# Patient Record
Sex: Female | Born: 1965 | Race: White | Hispanic: No | Marital: Married | Smoking: Current every day smoker
Health system: Southern US, Community
[De-identification: ages and names within clinical notes are randomized; demographics above are authoritative.]

## PROBLEM LIST (undated history)

## (undated) DIAGNOSIS — J45909 Unspecified asthma, uncomplicated: Secondary | ICD-10-CM

## (undated) DIAGNOSIS — K219 Gastro-esophageal reflux disease without esophagitis: Secondary | ICD-10-CM

## (undated) DIAGNOSIS — F17200 Nicotine dependence, unspecified, uncomplicated: Secondary | ICD-10-CM

## (undated) DIAGNOSIS — G459 Transient cerebral ischemic attack, unspecified: Secondary | ICD-10-CM

## (undated) DIAGNOSIS — I639 Cerebral infarction, unspecified: Secondary | ICD-10-CM

## (undated) DIAGNOSIS — T7840XA Allergy, unspecified, initial encounter: Secondary | ICD-10-CM

## (undated) DIAGNOSIS — E785 Hyperlipidemia, unspecified: Secondary | ICD-10-CM

## (undated) DIAGNOSIS — M1990 Unspecified osteoarthritis, unspecified site: Secondary | ICD-10-CM

## (undated) DIAGNOSIS — F419 Anxiety disorder, unspecified: Secondary | ICD-10-CM

## (undated) DIAGNOSIS — IMO0001 Reserved for inherently not codable concepts without codable children: Secondary | ICD-10-CM

## (undated) HISTORY — DX: Nicotine dependence, unspecified, uncomplicated: F17.200

## (undated) HISTORY — DX: Unspecified osteoarthritis, unspecified site: M19.90

## (undated) HISTORY — DX: Gastro-esophageal reflux disease without esophagitis: K21.9

## (undated) HISTORY — DX: Hyperlipidemia, unspecified: E78.5

## (undated) HISTORY — DX: Anxiety disorder, unspecified: F41.9

## (undated) HISTORY — PX: PLANTAR FASCIA SURGERY: SHX746

## (undated) HISTORY — DX: Allergy, unspecified, initial encounter: T78.40XA

## (undated) HISTORY — DX: Unspecified asthma, uncomplicated: J45.909

## (undated) HISTORY — DX: Reserved for inherently not codable concepts without codable children: IMO0001

## (undated) HISTORY — PX: OTHER SURGICAL HISTORY: SHX169

## (undated) HISTORY — DX: Cerebral infarction, unspecified: I63.9

## (undated) HISTORY — DX: Transient cerebral ischemic attack, unspecified: G45.9

## (undated) HISTORY — PX: FOOT SURGERY: SHX648

---

## 2005-12-28 DIAGNOSIS — M19049 Primary osteoarthritis, unspecified hand: Secondary | ICD-10-CM | POA: Insufficient documentation

## 2007-05-23 DIAGNOSIS — M545 Low back pain, unspecified: Secondary | ICD-10-CM | POA: Insufficient documentation

## 2007-05-23 DIAGNOSIS — G43909 Migraine, unspecified, not intractable, without status migrainosus: Secondary | ICD-10-CM | POA: Insufficient documentation

## 2007-05-23 DIAGNOSIS — G8929 Other chronic pain: Secondary | ICD-10-CM | POA: Insufficient documentation

## 2010-05-12 DIAGNOSIS — M533 Sacrococcygeal disorders, not elsewhere classified: Secondary | ICD-10-CM | POA: Insufficient documentation

## 2010-08-22 DIAGNOSIS — M5416 Radiculopathy, lumbar region: Secondary | ICD-10-CM | POA: Insufficient documentation

## 2013-10-10 DIAGNOSIS — E785 Hyperlipidemia, unspecified: Secondary | ICD-10-CM | POA: Insufficient documentation

## 2013-10-10 DIAGNOSIS — J309 Allergic rhinitis, unspecified: Secondary | ICD-10-CM | POA: Insufficient documentation

## 2014-12-22 DIAGNOSIS — Z87891 Personal history of nicotine dependence: Secondary | ICD-10-CM | POA: Insufficient documentation

## 2015-06-14 DIAGNOSIS — J4520 Mild intermittent asthma, uncomplicated: Secondary | ICD-10-CM | POA: Insufficient documentation

## 2016-06-23 ENCOUNTER — Ambulatory Visit (INDEPENDENT_AMBULATORY_CARE_PROVIDER_SITE_OTHER): Payer: 59

## 2016-06-23 ENCOUNTER — Encounter: Payer: Self-pay | Admitting: Podiatry

## 2016-06-23 ENCOUNTER — Ambulatory Visit (INDEPENDENT_AMBULATORY_CARE_PROVIDER_SITE_OTHER): Payer: 59 | Admitting: Podiatry

## 2016-06-23 VITALS — BP 141/99 | HR 87 | Temp 98.0°F | Resp 16

## 2016-06-23 DIAGNOSIS — S92242A Displaced fracture of medial cuneiform of left foot, initial encounter for closed fracture: Secondary | ICD-10-CM

## 2016-06-23 DIAGNOSIS — R600 Localized edema: Secondary | ICD-10-CM | POA: Diagnosis not present

## 2016-06-23 DIAGNOSIS — M79672 Pain in left foot: Secondary | ICD-10-CM

## 2016-06-23 DIAGNOSIS — M722 Plantar fascial fibromatosis: Secondary | ICD-10-CM

## 2016-06-27 NOTE — Progress Notes (Signed)
   HPI: Patient is a 51 year old female presenting today with a complaint of sharp pain to the dorsum of her left foot that began 3 days ago. She reports associated numbness and tingling. She states initially the foot was painful to touch but today that has improved. Walking and standing for long periods of time increases the pain. Elevating the foot, resting, icing and taking ibuprofen helps alleviate the pain. She is here for further evaluation and treatment.    Physical Exam: General: The patient is alert and oriented x3 in no acute distress.  Dermatology: Skin is warm, dry and supple bilateral lower extremities. Negative for open lesions or macerations.  Vascular: Palpable pedal pulses bilaterally. No edema or erythema noted. Capillary refill within normal limits.  Neurological: Epicritic and protective threshold grossly intact bilaterally.   Musculoskeletal Exam: Pain with palpation to the left dorsal foot with moderate edema. Tenderness to palpation at the medial calcaneal tubercale and through the insertion of the plantar fascia of the right foot. Range of motion within normal limits to all pedal and ankle joints bilateral. Muscle strength 5/5 in all groups bilateral.   Radiographic Exam:  Subacute comminuted, minimally displaced fracture of the medial cuneiform with evidence of callus formation and healing.   Assessment: 1. Fracture of medial cuneiform of left foot with callus formation and routine healing 2. Plantar fasciitis right 3. Left foot edema   Plan of Care:  1. Patient was evaluated. X-rays reviewed. 2. Injection of 0.5cc Celestone soluspan injected into the right heel at the insertion of the plantar fascia.  3. Compression anklet dispensed for the left ankle. 4. Short cam boot for the left lower extremity. Patient can be weightbearing 4 weeks. 5. Return to clinic in 4 weeks.   Edrick Kins, DPM [facility]  Dr. Edrick Kins, [place]                                        [place], [place] [address]                Office [phone]  Fax [phone]

## 2016-06-29 MED ORDER — BETAMETHASONE SOD PHOS & ACET 6 (3-3) MG/ML IJ SUSP: 3.0000 mg | Freq: Once | INTRAMUSCULAR | Status: DC

## 2016-06-29 NOTE — Patient Instructions (Signed)
[id number]

## 2016-07-21 ENCOUNTER — Ambulatory Visit (INDEPENDENT_AMBULATORY_CARE_PROVIDER_SITE_OTHER): Payer: 59 | Admitting: Podiatry

## 2016-07-21 ENCOUNTER — Ambulatory Visit (INDEPENDENT_AMBULATORY_CARE_PROVIDER_SITE_OTHER): Payer: 59

## 2016-07-21 ENCOUNTER — Encounter: Payer: Self-pay | Admitting: Podiatry

## 2016-07-21 DIAGNOSIS — M722 Plantar fascial fibromatosis: Secondary | ICD-10-CM | POA: Diagnosis not present

## 2016-07-21 DIAGNOSIS — S92242A Displaced fracture of medial cuneiform of left foot, initial encounter for closed fracture: Secondary | ICD-10-CM | POA: Diagnosis not present

## 2016-07-21 DIAGNOSIS — S92242D Displaced fracture of medial cuneiform of left foot, subsequent encounter for fracture with routine healing: Secondary | ICD-10-CM | POA: Diagnosis not present

## 2016-07-26 DIAGNOSIS — R519 Headache, unspecified: Secondary | ICD-10-CM | POA: Insufficient documentation

## 2016-07-26 DIAGNOSIS — Z8673 Personal history of transient ischemic attack (TIA), and cerebral infarction without residual deficits: Secondary | ICD-10-CM | POA: Insufficient documentation

## 2016-07-26 DIAGNOSIS — R51 Headache: Secondary | ICD-10-CM

## 2016-07-26 DIAGNOSIS — R251 Tremor, unspecified: Secondary | ICD-10-CM | POA: Insufficient documentation

## 2016-07-29 NOTE — Progress Notes (Signed)
   HPI: As well as plantar fasciitis to the right foot. Patient states that she's doing much better. 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 Patient sells a knot on the dorsum of the left foot.   Physical Exam: General: The patient is alert and oriented x3 in no acute distress.  Dermatology: Skin is warm, dry and supple bilateral lower extremities. Negative for open lesions or macerations.  Vascular: Palpable pedal pulses bilaterally. No edema or erythema noted. Capillary refill within normal limits.  Neurological: Epicritic and protective threshold grossly intact bilaterally.   Musculoskeletal Exam: Minimal pain on palpation noted to the left foot or plantar fascial pain to the right foot. Patient appears to be improved significantly. Range of motion is within normal limits.  Radiographic Exam:  Degenerative changes with joint space narrowing and arthritis noted to the first metatarsal cuneiform joint of the left foot.  Assessment: 1. Fracture of medial cuneiform of left foot resolved  2. Plantar fasciitis right   Plan of Care:  1. Patient was evaluated. X-rays reviewed. 2. recommended the patient wear good supportive shoe gear to support the structures of her foot bilaterally   3. Return to clinic when necessary  Edrick Kins, DPM [facility]  Dr. Edrick Kins, [place]                                        [place], [place] [address]                Office [phone]  Fax [phone]

## 2016-11-17 ENCOUNTER — Ambulatory Visit: Payer: 59 | Admitting: Podiatry

## 2016-11-21 ENCOUNTER — Ambulatory Visit: Payer: 59 | Admitting: Podiatry

## 2016-11-28 ENCOUNTER — Encounter: Payer: Self-pay | Admitting: Podiatry

## 2016-11-28 ENCOUNTER — Ambulatory Visit: Payer: 59 | Admitting: Podiatry

## 2016-11-28 DIAGNOSIS — M722 Plantar fascial fibromatosis: Secondary | ICD-10-CM | POA: Diagnosis not present

## 2016-11-28 DIAGNOSIS — M898X7 Other specified disorders of bone, ankle and foot: Secondary | ICD-10-CM | POA: Diagnosis not present

## 2016-11-30 NOTE — Progress Notes (Signed)
Subjective: Patient presents today for a plantar fasciitis flare up of bilateral feet that began 2-3 weeks ago. She rates the pain at 7/10. She states the injection she received at the last visit helped alleviate the pain. Sitting for long periods of time and then attempting to walk increases the pain. Resting the feet help alleviate the pain. Patient presents today for further treatment and evaluation.  No past medical history on file.   Objective: Physical Exam General: The patient is alert and oriented x3 in no acute distress.  Dermatology: Skin is warm, dry and supple bilateral lower extremities. Negative for open lesions or macerations bilateral.   Vascular: Dorsalis Pedis and Posterior Tibial pulses palpable bilateral.  Capillary fill time is immediate to all digits.  Neurological: Epicritic and protective threshold intact bilateral.   Musculoskeletal: Tenderness to palpation at the medial calcaneal tubercale and through the insertion of the plantar fascia of the bilateral feet. Symptomatic palpable spurring noted to the 1st met-cuneiform joint of the right foot. All other joints range of motion within normal limits bilateral. Strength 5/5 in all groups bilateral.    Assessment: 1. plantar fasciitis bilateral feet 2. 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   Plan of Care:  1. Patient evaluated. Xrays reviewed.   2. Injection of 0.5cc Celestone soluspan injected into the bilateral heels.  3. Today we discussed the conservative versus surgical management of the presenting pathology. The patient opts for surgical management. All possible complications and details of the procedure were explained. All patient questions were answered. No guarantees were expressed or implied. 4. Authorization for surgery was initiated today. Surgery will consist of exostectomy 1st metatarsal of the left foot and exostectomy medial cuneiform of the left foot.  5. Plantar fascial braces dispensed  bilaterally. 6. Continue taking OTC Motrin as needed.  7. Return to clinic 1 week post op.     M. , DPM [facility]  Dr.  M. , DPM    [address]                                   [place],  [address]                Office [phone]  Fax [phone]      

## 2017-01-16 ENCOUNTER — Ambulatory Visit (INDEPENDENT_AMBULATORY_CARE_PROVIDER_SITE_OTHER): Payer: Managed Care, Other (non HMO) | Admitting: Podiatry

## 2017-01-16 ENCOUNTER — Telehealth: Payer: Self-pay | Admitting: *Deleted

## 2017-01-16 ENCOUNTER — Encounter: Payer: Self-pay | Admitting: Podiatry

## 2017-01-16 DIAGNOSIS — M898X7 Other specified disorders of bone, ankle and foot: Secondary | ICD-10-CM | POA: Diagnosis not present

## 2017-01-16 MED ORDER — MELOXICAM 15 MG PO TABS: 15.0000 mg | ORAL_TABLET | Freq: Every day | ORAL | 1 refills | Status: AC

## 2017-01-16 MED ORDER — TRAMADOL HCL 50 MG PO TABS: 50.0000 mg | ORAL_TABLET | Freq: Three times a day (TID) | ORAL | 0 refills | Status: DC | PRN

## 2017-01-16 NOTE — Telephone Encounter (Signed)
"  I was given your number to call and schedule surgery with Dr. Ruby Cola.  Give me a call back.  We had spoken another time about setting something up but I wasn't able to make that.  Now, it looks like I'm going to have both feet worked on."

## 2017-01-17 NOTE — Telephone Encounter (Signed)
"  I'm just calling back to try and get my surgery scheduled."

## 2017-01-18 NOTE — Telephone Encounter (Addendum)
I attempted to return her call.  She wants to schedule surgery. I left her a message to call me back.  "We're playing phone tag.  Sorry I just missed your call.  I'd like to set up my surgery.  I want to get one foot done as soon as possible so it can heal, then I can get the other one done."  He can do it on February 7.  "That day will be fine."  (I need the surgical information from our [place] office.)  I have the information, patient also goes by [employer].  She is having an Ostectomy Complete Metatarsal Head 1st and Tarsal Exostectomy Med Cunieform left foot.

## 2017-01-18 NOTE — Progress Notes (Signed)
   Subjective: Patient presents today for follow up evaluation of plantar fasciitis of bilateral feet. She states the pain has improved significantly at this time. She reports a new complaint of pain to the dorsum of bilateral feet. She states the pain was present at the last appointment she had here but has worsened since. Patient presents today for further treatment and evaluation.  No past medical history on file.   Objective: Physical Exam General: The patient is alert and oriented x3 in no acute distress.  Dermatology: Skin is warm, dry and supple bilateral lower extremities. Negative for open lesions or macerations bilateral.   Vascular: Dorsalis Pedis and Posterior Tibial pulses palpable bilateral.  Capillary fill time is immediate to all digits.  Neurological: Epicritic and protective threshold intact bilateral.   Musculoskeletal: Negative for tenderness to palpation at the medial calcaneal tubercale and through the insertion of the plantar fascia of the bilateral feet. Symptomatic palpable spurring noted to the 1st met-cuneiform joint of the bilateral feet. All other joints range of motion within normal limits bilateral. Strength 5/5 in all groups bilateral.    Assessment: 1. plantar fasciitis bilateral feet - resolved  2. Exostosis 1st met-cuneiform joint bilateral  Plan of Care:  1. Patient evaluated.   2. Today we will reschedule surgery for exostectomy of the 1st met-cuneiform left foot. All possible complications and details re-explained. 3. Prescription for Tramadol provided to patient. 4. Prescription for Meloxicam provided to patient. 5. Return to clinic 1 week postop.  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   Edrick Kins, DPM [facility]  Dr. Edrick Kins, DPM    [address], [place] [address]                Office [phone]  Fax [phone]

## 2017-02-08 ENCOUNTER — Encounter: Payer: Self-pay | Admitting: Podiatry

## 2017-02-08 DIAGNOSIS — Q6689 Other  specified congenital deformities of feet: Secondary | ICD-10-CM | POA: Diagnosis not present

## 2017-02-08 DIAGNOSIS — M85872 Other specified disorders of bone density and structure, left ankle and foot: Secondary | ICD-10-CM | POA: Diagnosis not present

## 2017-02-16 ENCOUNTER — Ambulatory Visit (INDEPENDENT_AMBULATORY_CARE_PROVIDER_SITE_OTHER): Payer: Managed Care, Other (non HMO) | Admitting: Podiatry

## 2017-02-16 ENCOUNTER — Ambulatory Visit (INDEPENDENT_AMBULATORY_CARE_PROVIDER_SITE_OTHER): Payer: Managed Care, Other (non HMO)

## 2017-02-16 DIAGNOSIS — Z9889 Other specified postprocedural states: Secondary | ICD-10-CM

## 2017-02-16 DIAGNOSIS — M898X7 Other specified disorders of bone, ankle and foot: Secondary | ICD-10-CM

## 2017-02-16 MED ORDER — OXYCODONE-ACETAMINOPHEN 5-325 MG PO TABS: 1.0000 | ORAL_TABLET | Freq: Four times a day (QID) | ORAL | 0 refills | Status: DC | PRN

## 2017-02-19 NOTE — Progress Notes (Signed)
   Subjective:  Patient presents today status post exostectomy left. DOS: 02/08/17. She reports significant pain and rates it at 9/10 currently. She denies any modifying factors. She has been wearing the CAM boot as instructed as well as taking Percocet 5/325 mg as directed. Patient is here for further evaluation and treatment.    No past medical history on file.    Objective/Physical Exam Neurovascular status intact.  Skin incisions appear to be well coapted with sutures and staples intact. No sign of infectious process noted. No dehiscence. No active bleeding noted. Moderate edema noted to the surgical extremity.  Radiographic Exam:  Orthopedic hardware and osteotomies sites appear to be stable with routine healing.  Assessment: 1. s/p exostectomy left. DOS: 02/08/17   Plan of Care:  1. Patient was evaluated. X-rays reviewed 2. Dressing changed. Keep clean, dry and intact for one week.  3. Post op shoe dispensed. Discontinue wearing CAM boot.  4. Refill prescription for Percocet 5/325 mg provided to patient.  5. Return to clinic in 1 week.    Edrick Kins, DPM [facility]  Dr. Edrick Kins, [address]                                        [place], [place] [address]                Office [phone]  Fax [phone]

## 2017-02-23 ENCOUNTER — Encounter: Payer: Self-pay | Admitting: Podiatry

## 2017-02-23 ENCOUNTER — Ambulatory Visit (INDEPENDENT_AMBULATORY_CARE_PROVIDER_SITE_OTHER): Payer: Managed Care, Other (non HMO) | Admitting: Podiatry

## 2017-02-23 VITALS — BP 118/79 | HR 74 | Temp 97.6°F

## 2017-02-23 DIAGNOSIS — Z9889 Other specified postprocedural states: Secondary | ICD-10-CM

## 2017-02-25 NOTE — Progress Notes (Signed)
   Subjective:  Patient presents today status post exostectomy left. DOS: 02/08/17. She states she is doing well at this time. She denies any complaints. Patient is here for further evaluation and treatment.    No past medical history on file.    Objective/Physical Exam Neurovascular status intact.  Skin incisions appear to be well coapted with sutures and staples intact. No sign of infectious process noted. No dehiscence. No active bleeding noted. Moderate edema noted to the surgical extremity.  Assessment: 1. s/p exostectomy left. DOS: 02/08/17   Plan of Care:  1. Patient was evaluated. 2. Continue weightbearing in post op shoe. Transition into good sneakers over the next two weeks. 3. Continue work at home for 2 more weeks. 4. Return to clinic in 2 weeks.   Edrick Kins, DPM [facility]  Dr. Edrick Kins, [place]                                        [place], [place] [address]                Office [phone]  Fax [phone]

## 2017-03-07 DIAGNOSIS — M722 Plantar fascial fibromatosis: Secondary | ICD-10-CM

## 2017-03-09 ENCOUNTER — Ambulatory Visit (INDEPENDENT_AMBULATORY_CARE_PROVIDER_SITE_OTHER): Payer: Managed Care, Other (non HMO) | Admitting: Podiatry

## 2017-03-09 ENCOUNTER — Ambulatory Visit (INDEPENDENT_AMBULATORY_CARE_PROVIDER_SITE_OTHER): Payer: Managed Care, Other (non HMO)

## 2017-03-09 ENCOUNTER — Encounter: Payer: Self-pay | Admitting: Podiatry

## 2017-03-09 DIAGNOSIS — Z9889 Other specified postprocedural states: Secondary | ICD-10-CM

## 2017-03-09 DIAGNOSIS — M898X7 Other specified disorders of bone, ankle and foot: Secondary | ICD-10-CM

## 2017-03-09 MED ORDER — MELOXICAM 15 MG PO TABS: 15.0000 mg | ORAL_TABLET | Freq: Every day | ORAL | 1 refills | Status: AC

## 2017-03-09 MED ORDER — NONFORMULARY OR COMPOUNDED ITEM: 2 refills | Status: DC

## 2017-03-12 NOTE — Progress Notes (Signed)
Exostectomy first metatarsal cuneiform joint left foot.

## 2017-03-12 NOTE — Progress Notes (Signed)
   Subjective:  Patient presents today status post exostectomy left. DOS: 02/08/17. She states she is improving significantly. She states the incision looks well. She reports some continued pain that is worse in the morning time. She has been taking Ibuprofen and Tramadol. Patient is here for further evaluation and treatment.    No past medical history on file.    Objective/Physical Exam Neurovascular status intact. Skin incisions appear to be well coapted with sutures and staples intact. No sign of infectious process noted. No dehiscence. No active bleeding noted. Moderate edema noted to the surgical extremity.  Radiographic Exam:  Orthopedic hardware and osteotomies sites appear to be stable with routine healing.   Assessment: 1. s/p exostectomy left. DOS: 02/08/17 - improving    Plan of Care:  1. Patient was evaluated. X-Rays reviewed.  2. Continue taking Meloxicam daily. Refill prescription provided.  3. Prescription for antiinflammatory pain cream to be dispensed by [facility].  4. Continue wearing compression anklet with good shoe gear.  5. Return to clinic in 4 weeks.   Works at [employer].   Edrick Kins, DPM [facility]  Dr. Edrick Kins, [place]                                        [place], [place] [address]                Office [phone]  Fax [phone]

## 2017-04-06 ENCOUNTER — Ambulatory Visit (INDEPENDENT_AMBULATORY_CARE_PROVIDER_SITE_OTHER): Payer: Managed Care, Other (non HMO) | Admitting: Podiatry

## 2017-04-06 ENCOUNTER — Ambulatory Visit (INDEPENDENT_AMBULATORY_CARE_PROVIDER_SITE_OTHER): Payer: Managed Care, Other (non HMO)

## 2017-04-06 DIAGNOSIS — Z9889 Other specified postprocedural states: Secondary | ICD-10-CM

## 2017-04-06 DIAGNOSIS — M898X7 Other specified disorders of bone, ankle and foot: Secondary | ICD-10-CM

## 2017-04-06 DIAGNOSIS — G5792 Unspecified mononeuropathy of left lower limb: Secondary | ICD-10-CM

## 2017-04-06 DIAGNOSIS — M722 Plantar fascial fibromatosis: Secondary | ICD-10-CM

## 2017-04-06 MED ORDER — METHYLPREDNISOLONE 4 MG PO TBPK: ORAL_TABLET | ORAL | 0 refills | Status: DC

## 2017-04-06 MED ORDER — NONFORMULARY OR COMPOUNDED ITEM: 2 refills | Status: DC

## 2017-04-09 NOTE — Progress Notes (Signed)
   Subjective: 52 year old female presenting today with a chief complaint of a plantar fasciitis flare up of the bilateral feet that began two weeks ago. She reports the pain is worse with the first few steps out of bed in the morning. Walking and standing for long periods of time also increase the pain. She is requesting injections for pain and has not done anything for treatment yet. Patient is here for further evaluation and treatment.   No past medical history on file.   Objective: Physical Exam General: The patient is alert and oriented x3 in no acute distress.  Dermatology: Skin is warm, dry and supple bilateral lower extremities. Negative for open lesions or macerations bilateral.   Vascular: Dorsalis Pedis and Posterior Tibial pulses palpable bilateral.  Capillary fill time is immediate to all digits.  Neurological: Epicritic and protective threshold intact bilateral.   Musculoskeletal: Tenderness to palpation to the plantar aspect of the bilateral heels along the plantar fascia. All other joints range of motion within normal limits bilateral. Strength 5/5 in all groups bilateral.   Radiographic exam: Normal osseous mineralization. Joint spaces preserved. No fracture/dislocation/boney destruction. No other soft tissue abnormalities or radiopaque foreign bodies.   Assessment: 1. plantar fasciitis bilateral feet 2. Neuritis left foot  Plan of Care:  1. Patient evaluated. Xrays reviewed.   2. Prescription for pain cream to be dispensed from [facility].  3. Prescription for Medrol Dose Pak provided to patient.  4. Continue wearing compression sleeves and sneakers.  5. Return to clinic in 4 weeks.   Works at [employer].   Edrick Kins, DPM [facility]  Dr. Edrick Kins, DPM    [address], [place] [address]                Office [phone]  Fax [phone]

## 2017-04-11 ENCOUNTER — Other Ambulatory Visit: Payer: Managed Care, Other (non HMO) | Admitting: Orthotics

## 2017-04-17 ENCOUNTER — Ambulatory Visit (INDEPENDENT_AMBULATORY_CARE_PROVIDER_SITE_OTHER): Payer: Managed Care, Other (non HMO) | Admitting: Family Medicine

## 2017-04-17 ENCOUNTER — Encounter: Payer: Self-pay | Admitting: Family Medicine

## 2017-04-17 DIAGNOSIS — F17200 Nicotine dependence, unspecified, uncomplicated: Secondary | ICD-10-CM | POA: Diagnosis not present

## 2017-04-17 DIAGNOSIS — K219 Gastro-esophageal reflux disease without esophagitis: Secondary | ICD-10-CM | POA: Diagnosis not present

## 2017-04-17 DIAGNOSIS — R05 Cough: Secondary | ICD-10-CM | POA: Diagnosis not present

## 2017-04-17 DIAGNOSIS — R059 Cough, unspecified: Secondary | ICD-10-CM

## 2017-04-17 MED ORDER — ALBUTEROL SULFATE HFA 108 (90 BASE) MCG/ACT IN AERS
2.0000 | INHALATION_SPRAY | Freq: Four times a day (QID) | RESPIRATORY_TRACT | 0 refills | Status: DC | PRN
Start: 2017-04-17 — End: 2018-04-05

## 2017-04-17 MED ORDER — DOXYCYCLINE HYCLATE 100 MG PO TABS: 100.0000 mg | ORAL_TABLET | Freq: Two times a day (BID) | ORAL | 0 refills | Status: DC

## 2017-04-17 MED ORDER — VARENICLINE TARTRATE 1 MG PO TABS: 1.0000 mg | ORAL_TABLET | Freq: Two times a day (BID) | ORAL | 1 refills | Status: DC

## 2017-04-17 MED ORDER — ESOMEPRAZOLE MAGNESIUM 20 MG PO CPDR: 20.0000 mg | DELAYED_RELEASE_CAPSULE | Freq: Two times a day (BID) | ORAL | 1 refills | Status: DC

## 2017-04-17 NOTE — Patient Instructions (Signed)
Restart chantix.  Use albuterol if needed.  Start doxy but sunburn caution.  Try to get your old records from [place].  Take care.  Glad to see you.

## 2017-04-17 NOTE — Progress Notes (Signed)
Had been off chantix for about 5 days.  Prev took for about 1 month, with the starter pack.  She was taking it w/o ADE.  She had some vivid dreams but was able tolerate it.  It helped with smoking cessation.    Cough started about 1 week ago.  No known fevers above 100 but felt hot episodically.  Some wheeze.  Chest congestion but not sputum.  No vomiting.  No ST.  No facial pain.  No ear pain but her head feels stuffy.  D/w pt about smoking, she is cutting back.  H/o SABA use in the distant past but not recently. She can tolerate prednisone.   On PPI at baseline.  D/w pt.  Prev failed taper off med.    PMH and SH reviewed  ROS: Per HPI unless specifically indicated in ROS section   Meds, vitals, and allergies reviewed.   GEN: nad, alert and oriented HEENT: mucous membranes moist, tm w/o erythema, nasal exam w/o erythema, clear discharge noted,  OP with cobblestoning NECK: supple w/o LA CV: rrr.   PULM: coarse BS, no focal dec in bs, o/w ctab, no inc wob EXT: no edema SKIN: well perfused.

## 2017-04-18 ENCOUNTER — Telehealth: Payer: Self-pay | Admitting: Family Medicine

## 2017-04-18 ENCOUNTER — Ambulatory Visit (INDEPENDENT_AMBULATORY_CARE_PROVIDER_SITE_OTHER): Payer: Self-pay | Admitting: Orthotics

## 2017-04-18 DIAGNOSIS — G5792 Unspecified mononeuropathy of left lower limb: Secondary | ICD-10-CM

## 2017-04-18 DIAGNOSIS — M722 Plantar fascial fibromatosis: Secondary | ICD-10-CM

## 2017-04-18 MED ORDER — BENZONATATE 200 MG PO CAPS: 200.0000 mg | ORAL_CAPSULE | Freq: Three times a day (TID) | ORAL | 1 refills | Status: DC | PRN

## 2017-04-18 NOTE — Progress Notes (Signed)

## 2017-04-18 NOTE — Telephone Encounter (Signed)
I spoke with pt. Still non productive cough but pt can hear rattling sound in chest;no wheezing, SOB or difficulty breathing. OTC cough med helps during the day but not at night. No fever today. Pt request cough syrup to [facility]; pt not sleeping at night due to cough.

## 2017-04-18 NOTE — Telephone Encounter (Signed)
Would try tessalon as needed.  rx sent.  Thanks.

## 2017-04-18 NOTE — Telephone Encounter (Signed)
Left message on voicemail for patient to call back. 

## 2017-04-18 NOTE — Telephone Encounter (Signed)
Copied from [place] [id number]. Topic: Quick Communication - See Telephone Encounter >> Apr 18, 2017  2:35 PM Aurelio Brash B wrote: CRM for notification. See Telephone encounter for: 04/18/17. PT was seen yesterday  and she is asking  if Dr Damita Dunnings can prescribe cough syrup  she can not sleep  due to cough    [id number] [place], [place] [phone] (Phone) [phone] (Fax)

## 2017-04-19 ENCOUNTER — Encounter: Payer: Self-pay | Admitting: Family Medicine

## 2017-04-19 DIAGNOSIS — R059 Cough, unspecified: Secondary | ICD-10-CM | POA: Insufficient documentation

## 2017-04-19 DIAGNOSIS — K219 Gastro-esophageal reflux disease without esophagitis: Secondary | ICD-10-CM | POA: Insufficient documentation

## 2017-04-19 DIAGNOSIS — F17200 Nicotine dependence, unspecified, uncomplicated: Secondary | ICD-10-CM | POA: Insufficient documentation

## 2017-04-19 DIAGNOSIS — R05 Cough: Secondary | ICD-10-CM | POA: Insufficient documentation

## 2017-04-19 NOTE — Telephone Encounter (Signed)
Left detailed message on voicemail.  

## 2017-04-19 NOTE — Assessment & Plan Note (Signed)
Smoking cessation discussed with patient.  Start doxycycline.  Use albuterol as needed.  Update [place] as needed.  Nontoxic.  Still okay for outpatient follow-up.  She agrees.

## 2017-04-19 NOTE — Assessment & Plan Note (Signed)
Continue Chantix.  Routine cautions given. She is going to get her old records and establish with PCP here in the clinic in the near future.

## 2017-04-19 NOTE — Assessment & Plan Note (Signed)
On PPI at baseline.  D/w pt.  Prev failed taper off med.   Rx sent.  She is going to get her old records and establish with PCP here in the clinic in the near future.

## 2017-04-24 ENCOUNTER — Encounter: Payer: Self-pay | Admitting: Podiatry

## 2017-05-01 ENCOUNTER — Encounter: Payer: Self-pay | Admitting: Podiatry

## 2017-05-01 ENCOUNTER — Ambulatory Visit (INDEPENDENT_AMBULATORY_CARE_PROVIDER_SITE_OTHER): Payer: Managed Care, Other (non HMO) | Admitting: Podiatry

## 2017-05-01 ENCOUNTER — Ambulatory Visit (INDEPENDENT_AMBULATORY_CARE_PROVIDER_SITE_OTHER): Payer: Managed Care, Other (non HMO)

## 2017-05-01 DIAGNOSIS — M722 Plantar fascial fibromatosis: Secondary | ICD-10-CM

## 2017-05-01 DIAGNOSIS — M898X7 Other specified disorders of bone, ankle and foot: Secondary | ICD-10-CM

## 2017-05-01 DIAGNOSIS — Z9889 Other specified postprocedural states: Secondary | ICD-10-CM

## 2017-05-03 NOTE — Progress Notes (Signed)
   Subjective:  Patient presents today status post left foot exostectomy. DOS: 02/08/17. She states her condition has not changed. Her plantar fasciitis is still painful bilaterally. The pain is worse when she changes from a seated to standing position. Patient is here for further evaluation and treatment.   Past Medical History:  Diagnosis Date  . GERD (gastroesophageal reflux disease)   . Smoking       Objective/Physical Exam Neurovascular status intact.  Skin incisions appear to be well coapted. No sign of infectious process noted. No dehiscence. No active bleeding noted. Moderate edema noted to the surgical extremity. Pain with palpation to the plantar aspect of the bilateral heels along the plantar fascia.   Radiographic Exam:  Orthopedic hardware and osteotomies sites appear to be stable with routine healing.  Assessment: 1. s/p left foot exostectomy. DOS: 02/08/17 2. Plantar fasciitis bilateral    Plan of Care:  1. Patient was evaluated. X-rays reviewed 2. Injection of 0.5 mLs Celestone Soluspan injected into the bilateral plantar fascia.  3. Night splints dispensed bilaterally.  4. Has an appointment to pick up custom molded orthotics from [facility].  5. Return to clinic in 6 weeks.    Edrick Kins, DPM [facility]  Dr. Edrick Kins, [address]                                        [place], [place] [address]                Office [phone]  Fax [phone]

## 2017-05-04 ENCOUNTER — Encounter: Payer: Managed Care, Other (non HMO) | Admitting: Podiatry

## 2017-05-16 ENCOUNTER — Ambulatory Visit: Payer: Managed Care, Other (non HMO) | Admitting: Orthotics

## 2017-05-16 DIAGNOSIS — M722 Plantar fascial fibromatosis: Secondary | ICD-10-CM

## 2017-05-16 NOTE — Progress Notes (Signed)
Patient came in today to pick up custom made foot orthotics.  The goals were accomplished and the patient reported no dissatisfaction with said orthotics.  Patient was advised of breakin period and how to report any issues. 

## 2017-05-22 ENCOUNTER — Ambulatory Visit: Payer: Managed Care, Other (non HMO) | Admitting: Internal Medicine

## 2017-06-06 ENCOUNTER — Ambulatory Visit: Payer: Managed Care, Other (non HMO) | Admitting: Orthotics

## 2017-06-06 DIAGNOSIS — G5792 Unspecified mononeuropathy of left lower limb: Secondary | ICD-10-CM

## 2017-06-06 DIAGNOSIS — S92242D Displaced fracture of medial cuneiform of left foot, subsequent encounter for fracture with routine healing: Secondary | ICD-10-CM

## 2017-06-06 NOTE — Progress Notes (Signed)
Make into a dress f/o..sending dress flat to be modeled after.

## 2017-06-12 ENCOUNTER — Encounter: Payer: Self-pay | Admitting: Podiatry

## 2017-06-12 ENCOUNTER — Ambulatory Visit (INDEPENDENT_AMBULATORY_CARE_PROVIDER_SITE_OTHER): Payer: Managed Care, Other (non HMO) | Admitting: Podiatry

## 2017-06-12 DIAGNOSIS — M898X9 Other specified disorders of bone, unspecified site: Secondary | ICD-10-CM | POA: Diagnosis not present

## 2017-06-12 DIAGNOSIS — M722 Plantar fascial fibromatosis: Secondary | ICD-10-CM | POA: Diagnosis not present

## 2017-06-12 NOTE — Patient Instructions (Signed)
Pre-Operative Instructions  Congratulations, you have decided to take an important step towards improving your quality of life.  You can be assured that the doctors and staff at [facility] will be with you every step of the way.  Here are some important things you should know:  1. Plan to be at the surgery center/hospital at least 1 (one) hour prior to your scheduled time, unless otherwise directed by the surgical center/hospital staff.  You must have a responsible adult accompany you, remain during the surgery and drive you home.  Make sure you have directions to the surgical center/hospital to ensure you arrive on time. 2. If you are having surgery at [facility] or [facility] hospitals, you will need a copy of your medical history and physical form from your family physician within one month prior to the date of surgery. We will give you a form for your primary physician to complete.  3. We make every effort to accommodate the date you request for surgery.  However, there are times where surgery dates or times have to be moved.  We will contact you as soon as possible if a change in schedule is required.   4. No aspirin/ibuprofen for one week before surgery.  If you are on aspirin, any non-steroidal anti-inflammatory medications (Mobic, Aleve, Ibuprofen) should not be taken seven (7) days prior to your surgery.  You make take Tylenol for pain prior to surgery.  5. Medications - If you are taking daily heart and blood pressure medications, seizure, reflux, allergy, asthma, anxiety, pain or diabetes medications, make sure you notify the surgery center/hospital before the day of surgery so they can tell you which medications you should take or avoid the day of surgery. 6. No food or drink after midnight the night before surgery unless directed otherwise by surgical center/hospital staff. 7. No alcoholic beverages 24-hours prior to surgery.  No smoking 24-hours prior or 24-hours after  surgery. 8. Wear loose pants or shorts. They should be loose enough to fit over bandages, boots, and casts. 9. Don't wear slip-on shoes. Sneakers are preferred. 10. Bring your boot with you to the surgery center/hospital.  Also bring crutches or a walker if your physician has prescribed it for you.  If you do not have this equipment, it will be provided for you after surgery. 11. If you have not been contacted by the surgery center/hospital by the day before your surgery, call to confirm the date and time of your surgery. 12. Leave-time from work may vary depending on the type of surgery you have.  Appropriate arrangements should be made prior to surgery with your employer. 13. Prescriptions will be provided immediately following surgery by your doctor.  Fill these as soon as possible after surgery and take the medication as directed. Pain medications will not be refilled on weekends and must be approved by the doctor. 14. Remove nail polish on the operative foot and avoid getting pedicures prior to surgery. 15. Wash the night before surgery.  The night before surgery wash the foot and leg well with water and the antibacterial soap provided. Be sure to pay special attention to beneath the toenails and in between the toes.  Wash for at least three (3) minutes. Rinse thoroughly with water and dry well with a towel.  Perform this wash unless told not to do so by your physician.  Enclosed: 1 Ice pack (please put in freezer the night before surgery)   1 Hibiclens skin cleaner     Pre-op instructions  If you have any questions regarding the instructions, please do not hesitate to call our office.  [place]: [address], [place], [place] [address] -- [phone]  [place]: [address] [place], [place] [address] -- [phone]  [place]: [address]  , [place] [address] -- [phone]  [place]: [address], [address], [place], [place] [address] -- [phone]  Website:  [email or web address] 

## 2017-06-14 ENCOUNTER — Telehealth: Payer: Self-pay | Admitting: *Deleted

## 2017-06-14 NOTE — Telephone Encounter (Signed)
"  I am a patient of Dr. Amalia Hailey.  I was calling to get my surgery scheduled, hopefully sometime in August."  "Good morning Kristie Miles, this is [facility] again I was just trying to connect with you to schedule my surgery."

## 2017-06-14 NOTE — Telephone Encounter (Signed)
I attempted to return her call.  I could not leave a message because her mailbox was full.

## 2017-06-15 NOTE — Telephone Encounter (Signed)
I'm returning your call.  You wanted to schedule surgery.  "Yes, I do.  He said he's already pretty booked out.  What does he have available?"  He can do it on August 8.  "That's perfect, that's the date I was looking at!  I am so sorry for hounding you."  No problem, I apologize to you.  I cover all three offices.  "Oh my goodness, you poor thing.  No wonder, you have a lot on you."  Someone from the surgical center will call you a day or two prior to your surgery date and they will give you your arrival time.  You can go ahead and go on-line and register with the surgical center.  Instructions are in the brochure we gave you.  "I'll go ahead and take care of that this weekend."

## 2017-06-18 ENCOUNTER — Telehealth: Payer: Self-pay | Admitting: *Deleted

## 2017-06-18 NOTE — Telephone Encounter (Signed)
"  I'm trying to reach you so I can schedule my surgery.  I'm trying to plan a lot of things around it, so I really need this date. This is the third day I have called.  So, please return my call.

## 2017-06-18 NOTE — Progress Notes (Signed)
   Subjective: 52 year old female presenting today with a chief complaint of plantar fasciitis of bilateral feet. She reports numbness to the dorsum of the left foot with associated intermittent swelling at the site of the exostectomy on 02/08/17. She reports continued pain in the right foot as well. Patient presents today for further treatment and evaluation.  Past Medical History:  Diagnosis Date  . GERD (gastroesophageal reflux disease)   . Smoking      Objective: Physical Exam General: The patient is alert and oriented x3 in no acute distress.  Dermatology: Skin is warm, dry and supple bilateral lower extremities. Negative for open lesions or macerations bilateral.   Vascular: Dorsalis Pedis and Posterior Tibial pulses palpable bilateral.  Capillary fill time is immediate to all digits.  Neurological: Epicritic and protective threshold intact bilateral.   Musculoskeletal: Tenderness to palpation to the plantar aspect of the bilateral heels along the plantar fascia. All other joints range of motion within normal limits bilateral. Strength 5/5 in all groups bilateral.    Assessment: 1. plantar fasciitis bilateral feet 2. Exostosis 1st Met cuneiform right   Plan of Care:  1. Patient evaluated.   2. Today we discussed the conservative versus surgical management of the presenting pathology. The patient opts for surgical management. All possible complications and details of the procedure were explained. All patient questions were answered. No guarantees were expressed or implied. 3. Authorization for surgery was initiated today. Surgery will consist of EPF bilaterally, exostectomy 1st Met base right, exostectomy medial cuneiform right.  4. Return to clinic one week post op.     Edrick Kins, DPM [facility]  Dr. Edrick Kins, DPM    [address], [place] [address]                Office [phone]  Fax [phone]

## 2017-06-19 NOTE — Telephone Encounter (Signed)
Patient was called on June 14.  Surgery was scheduled for August 8.

## 2017-06-20 ENCOUNTER — Ambulatory Visit: Payer: Managed Care, Other (non HMO) | Admitting: Internal Medicine

## 2017-07-03 DIAGNOSIS — M79676 Pain in unspecified toe(s): Secondary | ICD-10-CM

## 2017-08-02 ENCOUNTER — Ambulatory Visit: Payer: Managed Care, Other (non HMO) | Admitting: Internal Medicine

## 2017-08-09 ENCOUNTER — Encounter: Payer: Self-pay | Admitting: Podiatry

## 2017-08-09 DIAGNOSIS — M25774 Osteophyte, right foot: Secondary | ICD-10-CM | POA: Diagnosis not present

## 2017-08-09 DIAGNOSIS — M722 Plantar fascial fibromatosis: Secondary | ICD-10-CM | POA: Diagnosis not present

## 2017-08-13 ENCOUNTER — Telehealth: Payer: Self-pay | Admitting: Podiatry

## 2017-08-13 ENCOUNTER — Other Ambulatory Visit: Payer: Self-pay | Admitting: Family Medicine

## 2017-08-13 NOTE — Telephone Encounter (Signed)
Sent. She was going to f/u with another PCP here in the clinic.  She'll need to get that set up.  Thanks.

## 2017-08-13 NOTE — Telephone Encounter (Signed)
I returned patient call, she stated that she twisted foot yesterday and felt a very sharp stinging pain and was concerned that she popped a stitch because of bleeding through the bandage.  I asked her if it was wet bloody drainage and she stated "no its dry".  She reported to me that it was feeling some better today, but she would like to come in tomorrow to make sure everything is ok.  She was sent to schedulers .

## 2017-08-13 NOTE — Telephone Encounter (Signed)
Electronic refill request Last refill 04/17/17 #60/1 Last office visit 04/17/17

## 2017-08-13 NOTE — Telephone Encounter (Signed)
I had surgery on Thursday with Dr. Amalia Hailey for plantar fasciitis bilateral as well as a bone spur removed from the top of one foot. I'm concerned I may have popped a stitch in my left heel yesterday. I felt a sharp pain and it's hurting in that specific spot now. We also saw a little bit of blood on my bandage. I'm just calling to see if I need to come in to get looked at? You can reach me at [phone]. Thanks so much. Bye bye.

## 2017-08-14 ENCOUNTER — Ambulatory Visit: Payer: Managed Care, Other (non HMO)

## 2017-08-14 ENCOUNTER — Ambulatory Visit (INDEPENDENT_AMBULATORY_CARE_PROVIDER_SITE_OTHER): Payer: Managed Care, Other (non HMO) | Admitting: Podiatry

## 2017-08-14 ENCOUNTER — Ambulatory Visit (INDEPENDENT_AMBULATORY_CARE_PROVIDER_SITE_OTHER): Payer: Managed Care, Other (non HMO)

## 2017-08-14 ENCOUNTER — Encounter: Payer: Self-pay | Admitting: Podiatry

## 2017-08-14 VITALS — BP 127/76 | HR 63 | Temp 97.6°F

## 2017-08-14 DIAGNOSIS — Z9889 Other specified postprocedural states: Secondary | ICD-10-CM

## 2017-08-14 DIAGNOSIS — M205X1 Other deformities of toe(s) (acquired), right foot: Secondary | ICD-10-CM

## 2017-08-14 MED ORDER — OXYCODONE-ACETAMINOPHEN 5-325 MG PO TABS: 1.0000 | ORAL_TABLET | ORAL | 0 refills | Status: DC | PRN

## 2017-08-14 MED ORDER — DOXYCYCLINE HYCLATE 100 MG PO TABS: 100.0000 mg | ORAL_TABLET | Freq: Two times a day (BID) | ORAL | 0 refills | Status: DC

## 2017-08-14 NOTE — Telephone Encounter (Signed)
Patient advised.

## 2017-08-16 NOTE — Progress Notes (Signed)
   Subjective:  Patient presents today status post EPF bilateral and exostectomy of the right foot. DOS: 08/09/17. She states she felt a popping sensation two days ago and states her incision opened slightly. She reports some pain but states it is tolerable with the pain medications. She denies modifying factors. Patient is here for further evaluation and treatment.    Past Medical History:  Diagnosis Date  . GERD (gastroesophageal reflux disease)   . Smoking       Objective/Physical Exam Neurovascular status intact.  Skin incisions appear to be well coapted with sutures and staples intact. No sign of infectious process noted. No dehiscence. No active bleeding noted. Moderate edema noted to the surgical extremity.  Radiographic Exam:  Osteotomies sites appear to be stable with routine healing.  Assessment: 1. s/p EPF bilateral, exostectomy right. DOS: 08/09/17   Plan of Care:  1. Patient was evaluated. X-rays reviewed 2. Dressing changed.  3. Continue weightbearing in post op shoe.  4. Refill prescription for Percocet 5/325 mg provided to patient.  5. Prescription for Doxycycline 100 mg provided to patient for prophylaxis.  6. Return to clinic in one week.    Edrick Kins, DPM [facility]  Dr. Edrick Kins, [address]                                        [place], [place] [address]                Office [phone]  Fax [phone]

## 2017-08-17 ENCOUNTER — Encounter: Payer: Managed Care, Other (non HMO) | Admitting: Podiatry

## 2017-08-20 ENCOUNTER — Telehealth: Payer: Self-pay | Admitting: Podiatry

## 2017-08-20 NOTE — Telephone Encounter (Signed)
Patient has called for the second time

## 2017-08-20 NOTE — Telephone Encounter (Signed)
Is it ok to refill pain medication?  Please advise

## 2017-08-20 NOTE — Telephone Encounter (Signed)
Okay to refill? 

## 2017-08-20 NOTE — Telephone Encounter (Signed)
Patient would like a refill on oxyCODONE-acetaminophen (PERCOCET/ROXICET) 5-325 MG tablet

## 2017-08-21 MED ORDER — OXYCODONE-ACETAMINOPHEN 5-325 MG PO TABS: 1.0000 | ORAL_TABLET | Freq: Four times a day (QID) | ORAL | 0 refills | Status: DC | PRN

## 2017-08-21 NOTE — Telephone Encounter (Signed)
Script has been printed and left at front desk for pick up.  Patient has been notified of rx.

## 2017-08-21 NOTE — Addendum Note (Signed)
Addended by: Graceann Congress D on: 08/21/2017 11:54 AM   Modules accepted: Orders

## 2017-08-24 ENCOUNTER — Encounter: Payer: Self-pay | Admitting: Podiatry

## 2017-08-24 ENCOUNTER — Ambulatory Visit (INDEPENDENT_AMBULATORY_CARE_PROVIDER_SITE_OTHER): Payer: Managed Care, Other (non HMO) | Admitting: Podiatry

## 2017-08-24 DIAGNOSIS — Z9889 Other specified postprocedural states: Secondary | ICD-10-CM

## 2017-08-24 MED ORDER — TRAMADOL HCL 50 MG PO TABS: 50.0000 mg | ORAL_TABLET | Freq: Four times a day (QID) | ORAL | 0 refills | Status: DC | PRN

## 2017-08-27 NOTE — Progress Notes (Signed)
   Subjective:  Patient presents today status post EPF bilateral and exostectomy of the right foot. DOS: 08/09/17. She states she has improved since her last visit. She reports some continued pain that began last night. She has been taking the pain medication which helps alleviate the pain. Patient is here for further evaluation and treatment.    Past Medical History:  Diagnosis Date  . GERD (gastroesophageal reflux disease)   . Smoking       Objective/Physical Exam Neurovascular status intact.  Skin incisions appear to be well coapted with sutures and staples intact. No sign of infectious process noted. No dehiscence. No active bleeding noted. Moderate edema noted to the surgical extremity.   Assessment: 1. s/p EPF bilateral, exostectomy right. DOS: 08/09/17   Plan of Care:  1. Patient was evaluated.  2. Sutures removed.  3. Continue weightbearing in post op shoe on the right foot.  4. Refill prescription for Tramadol provided to patient.  5. Return to clinic in 2 weeks.     Edrick Kins, DPM [facility]  Dr. Edrick Kins, [place]                                        [place], [place] [address]                Office [phone]  Fax [phone]

## 2017-09-06 ENCOUNTER — Ambulatory Visit (INDEPENDENT_AMBULATORY_CARE_PROVIDER_SITE_OTHER): Payer: Managed Care, Other (non HMO) | Admitting: Neurology

## 2017-09-06 ENCOUNTER — Encounter: Payer: Self-pay | Admitting: Neurology

## 2017-09-06 VITALS — BP 101/70 | HR 68 | Ht 65.0 in | Wt 155.0 lb

## 2017-09-06 DIAGNOSIS — G250 Essential tremor: Secondary | ICD-10-CM

## 2017-09-06 MED ORDER — PROPRANOLOL HCL 20 MG PO TABS: 20.0000 mg | ORAL_TABLET | Freq: Three times a day (TID) | ORAL | 3 refills | Status: DC

## 2017-09-06 NOTE — Patient Instructions (Signed)
We will increase your Propanolol to 20 mg 2 times a day, up to 3 times a day.   You have no signs of parkinsonism.   Please stay well hydrated, well rested.   I will see you back in 6 months.

## 2017-09-06 NOTE — Progress Notes (Signed)
Subjective:    Patient ID: Kristie Kristie Miles Kristie Miles is a 52 y.o. female.  HPI     Kristie Kristie Miles Age, MD, PhD [facility] [address], [address] [place], [place] [address]  Dear Dr. Nancy Kristie Miles,   I saw your patient, Kristie Kristie Miles Kristie Miles, upon your kind request, in my neurologic clinic today for initial consultation of her hand tremors. The patient is unaccompanied today. As you know, Kristie Kristie Miles Kristie Miles is a 52 year old right-handed woman with an underlying medical history of reflux disease, hyperlipidemia, migraine headaches, smoking, depression and mildly overweight state, who reports in at least 10 year history of hand tremors. She recalls that she had a mild tremor in her early 23s. Her tremor has progressed with time. I reviewed your office note from 07/16/2017. She has been on propranolol for tremors and previously followed with Dr. Melrose Kristie Miles at [facility] clinic neurology. She feels that the propranolol is not as effective as it used to be. She has a tremor in both hands. She works for [employer]. She is engaged and lives with her fianc and their kids. She has 1 biological daughter, Kristie Miles 64. She has noticed that her daughter has had some hand tremors. Her mother developed tremor later in life and is on propranolol as well. Patient is trying to quit smoking. She smokes half a pack per day, drinks alcohol about 3 days out of the week, typically in the form of wine, a little more consistently on the weekends. She has not actually noticed any improvement in her tremor after consuming alcohol. She is trying to cut back because of recent weight gain. She is typically well rested and well hydrated with water. She currently works from home as she had right foot surgery for bone spur and plantar fasciitis in August 2019. She had left foot surgery in November 2018. She is currently wearing a boot on the right foot. She is currently on gabapentin 200 mg twice a day for migraine prevention which has worked well for her.  She has tried Topamax in the distant past it looks like. She is on generic Paxil 40 mg daily for her depression which has worked well for her. In the past, she was told that she had mini strokes. She was on baby aspirin for years, she recently stopped taking it. I did encourage her to go back on it especially as she did well taking it, no side effects reported. She was also told in the past that she had low vitamin D and has been on an over-the-counter vitamin D supplement which she currently does not use a regular basis. She has not had her vitamin D checked in years as I understand.  Her Past Medical History Is Significant For: Past Medical History:  Diagnosis Date  . GERD (gastroesophageal reflux disease)   . Smoking     Her Past Surgical History Is Significant For:  Her Family History Is Significant For: No family history on file.  Her Social History Is Significant For: Social History   Socioeconomic History  . Marital status: Single    Spouse name: Not on file  . Number of children: Not on file  . Years of education: Not on file  . Highest education level: Not on file  Occupational History  . Not on file  Social Needs  . Financial resource strain: Not on file  . Food insecurity:    Worry: Not on file    Inability: Not on file  . Transportation needs:  Medical: Not on file    Non-medical: Not on file  Tobacco Use  . Smoking status: Current Every Day Smoker  . Smokeless tobacco: Never Used  . Tobacco comment: 1/2 PPD.  prev able to tolerate chantix  Substance and Sexual Activity  . Alcohol use: Yes    Comment: social drinker  . Drug use: No  . Sexual activity: Not on file  Lifestyle  . Physical activity:    Days per week: Not on file    Minutes per session: Not on file  . Stress: Not on file  Relationships  . Social connections:    Talks on phone: Not on file    Gets together: Not on file    Attends religious service: Not on file    Active member of club or  organization: Not on file    Attends meetings of clubs or organizations: Not on file    Relationship status: Not on file  Other Topics Concern  . Not on file  Social History Narrative  . Not on file    Her Allergies Are:  Allergies  Allergen Reactions  . Sulfa Antibiotics Rash  :   Her Current Medications Are:  Outpatient Encounter Medications as of 09/06/2017  Medication Sig  . albuterol (PROVENTIL HFA;VENTOLIN HFA) 108 (90 Base) MCG/ACT inhaler Inhale 2 puffs into the lungs every 6 (six) hours as needed for wheezing or shortness of breath.  . cetirizine (ZYRTEC) 10 MG tablet   . CHANTIX CONTINUING MONTH PAK 1 MG tablet TAKE 1 TABLET BY MOUTH TWICE A DAY  . esomeprazole (NEXIUM) 20 MG capsule Take 1 capsule (20 mg total) by mouth 2 (two) times daily before a meal.  . Flaxseed, Linseed, (FLAX SEEDS PO) Take by mouth.  . gabapentin (NEURONTIN) 100 MG capsule   . meloxicam (MOBIC) 15 MG tablet   . PARoxetine (PAXIL) 40 MG tablet Take 40 mg by mouth every morning.  . propranolol (INDERAL) 10 MG tablet Take by mouth 2 (two) times daily.  . simvastatin (ZOCOR) 20 MG tablet Take 20 mg by mouth daily.  . traMADol (ULTRAM) 50 MG tablet Take 1 tablet (50 mg total) by mouth every 6 (six) hours as needed.  . [DISCONTINUED] aspirin EC 81 MG tablet Take 81 mg by mouth daily.  . [DISCONTINUED] benzonatate (TESSALON) 200 MG capsule Take 1 capsule (200 mg total) by mouth 3 (three) times daily as needed.  . [DISCONTINUED] cholecalciferol (VITAMIN D) 1000 units tablet Take 1,000 Units by mouth daily.  . [DISCONTINUED] doxycycline (VIBRA-TABS) 100 MG tablet Take 1 tablet (100 mg total) by mouth 2 (two) times daily.  . [DISCONTINUED] fexofenadine (ALLEGRA) 180 MG tablet Take 180 mg by mouth daily.  . [DISCONTINUED] nortriptyline (PAMELOR) 10 MG capsule Take 1 pill at night for one week then increase to 2 pills at night  . [DISCONTINUED] Omega-3 Fatty Acids (FISH OIL) 1000 MG CAPS Take by mouth.  .  [DISCONTINUED] Omeprazole (PRILOSEC PO) Prilosec  . [DISCONTINUED] oxyCODONE-acetaminophen (PERCOCET/ROXICET) 5-325 MG tablet Take 1 tablet by mouth every 6 (six) hours as needed. for pain  . [DISCONTINUED] pantoprazole (PROTONIX) 40 MG tablet pantoprazole 40 mg tablet,delayed release  . [DISCONTINUED] topiramate (TOPAMAX) 50 MG tablet topiramate 50 mg tablet   Facility-Administered Encounter Medications as of 09/06/2017  Medication  . betamethasone acetate-betamethasone sodium phosphate (CELESTONE) injection 3 mg  :   Review of Systems:  Out of a complete 14 point review of systems, all are reviewed and negative with the exception  of these symptoms as listed below:  Review of Systems  Neurological:       Pt presents today to discuss her bilateral hand tremors. Pt is taking propranolol but does not feel that this is as effective as it used to be. Pt's mother and daughter have tremors as well. Pt is right handed.    Objective:  Neurological Exam  Physical Exam Physical Examination:   Vitals:   09/06/17 1013  BP: 101/70  Pulse: 68    General Examination: The patient is a very pleasant 52 y.o. female in no acute distress. She appears well-developed and well-nourished and well groomed.   HEENT: Normocephalic, atraumatic, pupils are equal, round and reactive to light and accommodation. Extraocular tracking is good without limitation to gaze excursion or nystagmus noted. Normal smooth pursuit is noted. Hearing is grossly intact. Face is symmetric with normal facial animation and normal facial sensation. Speech is clear with no dysarthria noted. There is no hypophonia. Is no obvious voice or lip tremor. She has a very slight intermittent head tremor. Neck is supple with full range of motion. She has no carotid bruits. Airway examination is benign. Tongue protrudes centrally and palate elevates symmetrically.  Chest: Clear to auscultation without wheezing, rhonchi or crackles  noted.  Heart: S1+S2+0, regular and normal without murmurs, rubs or gallops noted.   Abdomen: Soft, non-tender and non-distended with normal bowel sounds appreciated on auscultation.  Extremities: There is no pitting edema in the distal lower extremities bilaterally. Bilateral compression socks, right foot in a boot.   Skin: Warm and dry without trophic changes noted. There are no varicose veins.  Musculoskeletal: exam reveals no obvious joint deformities, tenderness or joint swelling or erythema.   Neurologically:  Mental status: The patient is awake, alert and oriented in all 4 spheres. Her immediate and remote memory, attention, language skills and fund of knowledge are appropriate. There is no evidence of aphasia, agnosia, apraxia or anomia. Speech is clear with normal prosody and enunciation. Thought process is linear. Mood is normal and affect is normal.  Cranial nerves II - XII are as described above under HEENT exam. In addition: shoulder shrug is normal with equal shoulder height noted. Motor exam: Normal bulk, strength and tone is noted. There is no drift, resting tremor or rebound. She has a minimal to mild bilateral upper extremity postural and slight action tremor, slightly worse on the left than right. She has no lower extremity tremors.  On 09/06/2017: Archimedes spiral drawing she has minimal trembling with both hands. Handwriting with her right hand is legible, not particularly tremulous, not micrographic.  Romberg is negative. Reflexes are 1+ throughout. Fine motor skills and coordination: intact with normal finger taps, normal hand movements, normal rapid alternating patting, normal foot taps and normal foot agility.  Cerebellar testing: No dysmetria or intention tremor. No ataxia.   Sensory exam: intact to light touch in the upper and lower extremities.  Gait, station and balance: She stands easily. No veering to one side is noted. No leaning to one side is noted. Posture is  Kristie Miles-appropriate and stance is narrow based. Gait shows slight limp on the R d/t boot.   Assessment and plan:  In summary, Kristie Kristie Miles Kristie Miles is a very pleasant 52 y.o.-year old female  with an underlying medical history of reflux disease, hyperlipidemia, migraine headaches, smoking, depression and mildly overweight state, who presents for evaluation of her hand tremors. Her history and examination are in keeping with essential tremor. She has responded to  low-dose propranolol. She has a rescue inhaler for what sounds like reactive airways disease. She has not been diagnosed with COPD or asthma before. She is advised that we can increase the propranolol, currently she is taking 10 mg twice a day. I suggested she increase it to 20 mg strength, 1 pill twice a day to up to 3 times a day. She is reassured that there are no signs of parkinsonism on examination. She is advised to routinely follow-up in 6 months, sooner if needed. She is advised to continue with baby aspirin and she was told in the past that she had mini strokes. She had been on it for years and tolerated it well. Furthermore, she has a history of low vitamin D, she is encouraged to get this rechecked with you. She is encouraged to stay well hydrated, well rested and quit smoking.  I answered all her questions today and she was in agreement.  Thank you very much for allowing me to participate in the care of this nice patient. If I can be of any further assistance to you please do not hesitate to call me at [phone].  Sincerely,   Kristie Kristie Miles Age, MD, PhD

## 2017-09-07 ENCOUNTER — Encounter: Payer: Managed Care, Other (non HMO) | Admitting: Podiatry

## 2017-09-13 ENCOUNTER — Telehealth: Payer: Self-pay | Admitting: Neurology

## 2017-09-13 MED ORDER — PROPRANOLOL HCL ER 160 MG PO CP24: 160.0000 mg | ORAL_CAPSULE | Freq: Every day | ORAL | 5 refills | Status: DC

## 2017-09-13 NOTE — Telephone Encounter (Signed)
I called pt and advised her of this information. Pt verbalized understanding and appreciation.

## 2017-09-13 NOTE — Telephone Encounter (Signed)
Pt has called re: her propranolol (INDERAL) 20 MG tablet she states that her original mg was 60.  Pt believes it was in error that her mg is now 20.  Pt is asking for a call back to discuss this

## 2017-09-13 NOTE — Telephone Encounter (Signed)
I called pt. She reports that her actual dose of propranolol was 60mg  BID. So, the "increase" Dr. Rexene Alberts ordered isn't really an increase. She is wondering if this can be fixed?

## 2017-09-13 NOTE — Telephone Encounter (Signed)
She can try the extended release propranolol 160 mg once daily instead. Rx done, pls update pt.

## 2017-09-13 NOTE — Addendum Note (Signed)
Addended by: Star Age on: 09/13/2017 04:26 PM   Modules accepted: Orders

## 2017-09-21 ENCOUNTER — Telehealth: Payer: Self-pay | Admitting: Podiatry

## 2017-09-21 MED ORDER — TRAMADOL HCL 50 MG PO TABS: 50.0000 mg | ORAL_TABLET | Freq: Four times a day (QID) | ORAL | 0 refills | Status: DC | PRN

## 2017-09-21 NOTE — Addendum Note (Signed)
Addended by: Graceann Congress D on: 09/21/2017 03:06 PM   Modules accepted: Orders

## 2017-09-21 NOTE — Telephone Encounter (Signed)
I spoke with Dr. Amalia Hailey regarding refill request for Tramadol.  Per his verbal order, ok to refill.  Script has been phoned into [facility] and patient has been notified.

## 2017-09-21 NOTE — Telephone Encounter (Signed)
My pharmacy called in a request for a refill on my tramadol yesterday. I need to get that refilled for the weekend, so I just wanted to make sure that someone can make that happen before everyone disappears for the weekend. If you could give me a call, my number is [phone] and [facility] [place]. If you have any questions, please call me. Thank you.

## 2017-09-25 ENCOUNTER — Encounter: Payer: Managed Care, Other (non HMO) | Admitting: Podiatry

## 2017-09-25 ENCOUNTER — Ambulatory Visit (INDEPENDENT_AMBULATORY_CARE_PROVIDER_SITE_OTHER): Payer: Managed Care, Other (non HMO) | Admitting: Podiatry

## 2017-09-25 ENCOUNTER — Encounter: Payer: Self-pay | Admitting: Podiatry

## 2017-09-25 ENCOUNTER — Ambulatory Visit (INDEPENDENT_AMBULATORY_CARE_PROVIDER_SITE_OTHER): Payer: Managed Care, Other (non HMO)

## 2017-09-25 DIAGNOSIS — M722 Plantar fascial fibromatosis: Secondary | ICD-10-CM | POA: Diagnosis not present

## 2017-09-25 DIAGNOSIS — Z9889 Other specified postprocedural states: Secondary | ICD-10-CM

## 2017-09-25 DIAGNOSIS — M205X1 Other deformities of toe(s) (acquired), right foot: Secondary | ICD-10-CM | POA: Diagnosis not present

## 2017-09-25 NOTE — Progress Notes (Signed)
   Subjective:  Patient presents today status post EPF bilateral and exostectomy of the right foot. DOS: 08/09/17. She reports some continued mild pain. She denies modifying factors or new complaints at this time. She has been using the compression anklets as directed without issue. Patient is here for further evaluation and treatment.    Past Medical History:  Diagnosis Date  . GERD (gastroesophageal reflux disease)   . Smoking       Objective/Physical Exam Neurovascular status intact.  Skin incisions appear to be well coapted. No sign of infectious process noted. No dehiscence. No active bleeding noted. Moderate edema noted to the surgical extremity.  Radiographic Exam:  Osteotomies sites appear to be stable with routine healing.  Assessment: 1. s/p EPF bilateral, exostectomy right. DOS: 08/09/17   Plan of Care:  1. Patient was evaluated. X-Rays reviewed.  2. Continue using compression anklets.  3. Plantar fascial braces dispensed bilaterally.  4. Recommended good shoe gear.  5. Return to work in 10 days.  6. Return to clinic in 6 weeks.      Edrick Kins, DPM [facility]  Dr. Edrick Kins, [place]                                        [place], [place] [address]                Office [phone]  Fax [phone]

## 2017-10-04 NOTE — Progress Notes (Signed)
DOS: 08/09/2017 Endoscopic Plantar Fasciotomy Bilateral; Exostectomy Medial Cuneiform RT; Exostectomy 1st metatarsal RT   [facility]

## 2017-10-08 ENCOUNTER — Other Ambulatory Visit: Payer: Self-pay | Admitting: Family Medicine

## 2017-10-08 NOTE — Telephone Encounter (Signed)
Electronic refill request Chantix Last refill 08/13/17 #60/1 Last office visit 04/17/17

## 2017-10-09 ENCOUNTER — Other Ambulatory Visit: Payer: Self-pay | Admitting: *Deleted

## 2017-10-09 MED ORDER — VARENICLINE TARTRATE 1 MG PO TABS: 1.0000 mg | ORAL_TABLET | Freq: Two times a day (BID) | ORAL | 0 refills | Status: DC

## 2017-10-09 NOTE — Telephone Encounter (Signed)
Previously denied Rx but patient was seen by you on 04/17/17.  See previous note.

## 2017-10-09 NOTE — Telephone Encounter (Signed)
I sent this one more time.  She was going to f/u with another PCP here in the clinic.  She needs to get that set up.  Thanks.

## 2017-10-09 NOTE — Telephone Encounter (Signed)
Patient advised.

## 2017-10-09 NOTE — Telephone Encounter (Signed)
My understanding is that she was going to establish with [facility].  It appears those appointments were canceled.  What is her status?  I am technically not her PCP.  Thanks.

## 2017-10-23 ENCOUNTER — Other Ambulatory Visit: Payer: Self-pay | Admitting: Family Medicine

## 2017-10-24 ENCOUNTER — Other Ambulatory Visit: Payer: Self-pay

## 2017-10-24 ENCOUNTER — Telehealth: Payer: Self-pay | Admitting: Family Medicine

## 2017-10-24 MED ORDER — TRAMADOL HCL 50 MG PO TABS: 50.0000 mg | ORAL_TABLET | Freq: Four times a day (QID) | ORAL | 1 refills | Status: DC | PRN

## 2017-10-24 NOTE — Telephone Encounter (Signed)
Pharmacy refill request for Tramadol.  Per Dr. Amalia Hailey verbal order, ok to refill.  Script has been phoned into pharmacy.

## 2017-10-24 NOTE — Telephone Encounter (Signed)
I am not her PCP and she needs to get set up with her primary here.  See previous refill request.  Thanks.  I denied this in the meantime.

## 2017-10-24 NOTE — Telephone Encounter (Signed)
FYI Spoke with pt she stated to disregard refill request.  Pt declined to set up new patient appointment at this time

## 2017-10-24 NOTE — Telephone Encounter (Signed)
Please see message from Dr. Damita Dunnings. Please contact patient about getting set up with a PCP here.

## 2017-10-24 NOTE — Telephone Encounter (Signed)
Electronic refill request Chantix Last office visit 04/17/17 Last refill 10/09/17 #60

## 2017-11-06 ENCOUNTER — Ambulatory Visit (INDEPENDENT_AMBULATORY_CARE_PROVIDER_SITE_OTHER): Payer: Managed Care, Other (non HMO) | Admitting: Podiatry

## 2017-11-06 ENCOUNTER — Encounter: Payer: Self-pay | Admitting: Podiatry

## 2017-11-06 DIAGNOSIS — M7742 Metatarsalgia, left foot: Secondary | ICD-10-CM

## 2017-11-06 DIAGNOSIS — M7741 Metatarsalgia, right foot: Secondary | ICD-10-CM

## 2017-11-06 MED ORDER — MELOXICAM 15 MG PO TABS: 15.0000 mg | ORAL_TABLET | Freq: Every day | ORAL | 1 refills | Status: DC

## 2017-11-06 MED ORDER — METHYLPREDNISOLONE 4 MG PO TBPK: ORAL_TABLET | ORAL | 0 refills | Status: DC

## 2017-11-08 NOTE — Progress Notes (Signed)
   HPI: 52 year old female presenting today for follow up evaluation of bilateral EPF and right exostectomy. DOS: 08/09/17. She states her heel has improved but is now experiencing pain in the balls of her feet that began a few weeks ago. She states she believes the pain may be from overcompensating. She denies any known trauma or injury. Patient is here for further evaluation and treatment.   Past Medical History:  Diagnosis Date  . GERD (gastroesophageal reflux disease)   . Smoking      Physical Exam: General: The patient is alert and oriented x3 in no acute distress.  Dermatology: Skin is warm, dry and supple bilateral lower extremities. Negative for open lesions or macerations.  Vascular: Palpable pedal pulses bilaterally. No edema or erythema noted. Capillary refill within normal limits.  Neurological: Epicritic and protective threshold grossly intact bilaterally.   Musculoskeletal Exam: Pain with palpation to the bilateral metatarsals. Range of motion within normal limits to all pedal and ankle joints bilateral. Muscle strength 5/5 in all groups bilateral.   Assessment: 1. Metatarsalgia bilateral  2. EPF bilateral, exostectomy right. DOS: 08/09/17   Plan of Care:  1. Patient evaluated.   2. Met pads dispensed.  3. Prescription for Medrol Dose Pak provided to patient.  4. Prescription for Meloxicam provided to patient.  5. Return to clinic in 4 weeks.       Edrick Kins, DPM [facility]  Dr. Edrick Kins, DPM    [address], [place] [address]                Office [phone]  Fax [phone]

## 2017-11-22 ENCOUNTER — Encounter: Payer: Self-pay | Admitting: Internal Medicine

## 2017-11-22 ENCOUNTER — Ambulatory Visit (INDEPENDENT_AMBULATORY_CARE_PROVIDER_SITE_OTHER): Payer: Managed Care, Other (non HMO) | Admitting: Internal Medicine

## 2017-11-22 VITALS — BP 106/68 | HR 63 | Temp 98.1°F | Wt 156.0 lb

## 2017-11-22 DIAGNOSIS — B9789 Other viral agents as the cause of diseases classified elsewhere: Secondary | ICD-10-CM

## 2017-11-22 DIAGNOSIS — J329 Chronic sinusitis, unspecified: Secondary | ICD-10-CM

## 2017-11-22 DIAGNOSIS — R05 Cough: Secondary | ICD-10-CM | POA: Diagnosis not present

## 2017-11-22 DIAGNOSIS — R059 Cough, unspecified: Secondary | ICD-10-CM

## 2017-11-22 MED ORDER — HYDROCOD POLST-CPM POLST ER 10-8 MG/5ML PO SUER: 5.0000 mL | Freq: Every evening | ORAL | 0 refills | Status: DC | PRN

## 2017-11-22 MED ORDER — FLUTICASONE PROPIONATE HFA 44 MCG/ACT IN AERO: 2.0000 | INHALATION_SPRAY | Freq: Two times a day (BID) | RESPIRATORY_TRACT | 12 refills | Status: DC

## 2017-11-22 NOTE — Progress Notes (Signed)
HPI  Pt presents to the clinic today with c/o headache, nasal congestion and cough. She reports this started 2 weeks ago. The headache is located. She describes the pain as. She reports associated. She denies. She is not blowing anything out of her nose. The cough is non productive. She reports she was seen 2 weeks ago for the same, treated with Prednisone Taper, Augmentin, Flonase, Hycodan and Tessalon. She reports she went to [facility] 3 days for follow up. Chest xray was negative.  Review of Systems     Past Medical History:  Diagnosis Date  . GERD (gastroesophageal reflux disease)   . Smoking     No family history on file.  Social History   Socioeconomic History  . Marital status: Single    Spouse name: Not on file  . Number of children: Not on file  . Years of education: Not on file  . Highest education level: Not on file  Occupational History  . Not on file  Social Needs  . Financial resource strain: Not on file  . Food insecurity:    Worry: Not on file    Inability: Not on file  . Transportation needs:    Medical: Not on file    Non-medical: Not on file  Tobacco Use  . Smoking status: Current Every Day Smoker  . Smokeless tobacco: Never Used  . Tobacco comment: 1/2 PPD.  prev able to tolerate chantix  Substance and Sexual Activity  . Alcohol use: Yes    Comment: social drinker  . Drug use: No  . Sexual activity: Not on file  Lifestyle  . Physical activity:    Days per week: Not on file    Minutes per session: Not on file  . Stress: Not on file  Relationships  . Social connections:    Talks on phone: Not on file    Gets together: Not on file    Attends religious service: Not on file    Active member of club or organization: Not on file    Attends meetings of clubs or organizations: Not on file    Relationship status: Not on file  . Intimate partner violence:    Fear of current or ex partner: Not on file    Emotionally abused: Not on file   Physically abused: Not on file    Forced sexual activity: Not on file  Other Topics Concern  . Not on file  Social History Narrative  . Not on file    Allergies  Allergen Reactions  . Sulfa Antibiotics Rash     Constitutional: Positive headache. Denies fatigue, fever or abrupt weight changes.  HEENT:  Positive nasal congestion. Denies eye redness, ear pain, ringing in the ears, wax buildup, runny nose or bloody nose. Respiratory: Positive cough. Denies difficulty breathing or shortness of breath.  Cardiovascular: Denies chest pain, chest tightness, palpitations or swelling in the hands or feet.   No other specific complaints in a complete review of systems (except as listed in HPI above).  BP 106/68   Pulse 63   Temp 98.1 F (36.7 C) (Oral)   Wt 156 lb (70.8 kg)   SpO2 97%   BMI 25.96 kg/m   General: Appears her stated age, well developed, well nourished in NAD. HEENT: Head: normal shape and size, no sinus tenderness noted;  Ears: bilateral cerumen impaction; Nose: mucosa boggy and moist, septum midline; Throat/Mouth: + PND. Teeth present, mucosa pink and moist, no exudate noted, no lesions or  ulcerations noted.  Neck:  No adenopathy noted.  Cardiovascular: Normal rate and rhythm. S1,S2 noted.  No murmur, rubs or gallops noted.  Pulmonary/Chest: Normal effort and positive vesicular breath sounds. No respiratory distress. No wheezes, rales or ronchi noted.       Assessment & Plan:   Viral Sinusitis, Cough  Continue Zyrtec and Flonase Stop Prednisone RX for Flovent 2 puffs BID- advised her to rinse mouth after each use RX for Tussionex for cough If persist, consider referral to allergy vs ENT vs pulmonology for further evaluation  RTC as needed or if symptoms persist. Webb Silversmith, NP

## 2017-11-22 NOTE — Patient Instructions (Signed)

## 2017-11-27 ENCOUNTER — Encounter: Payer: Managed Care, Other (non HMO) | Admitting: Podiatry

## 2017-11-28 ENCOUNTER — Encounter: Payer: Self-pay | Admitting: Family Medicine

## 2017-11-28 ENCOUNTER — Ambulatory Visit (INDEPENDENT_AMBULATORY_CARE_PROVIDER_SITE_OTHER): Payer: Managed Care, Other (non HMO) | Admitting: Family Medicine

## 2017-11-28 VITALS — BP 106/74 | HR 68 | Temp 98.2°F | Ht 65.0 in | Wt 159.8 lb

## 2017-11-28 DIAGNOSIS — R05 Cough: Secondary | ICD-10-CM | POA: Diagnosis not present

## 2017-11-28 DIAGNOSIS — B009 Herpesviral infection, unspecified: Secondary | ICD-10-CM | POA: Diagnosis not present

## 2017-11-28 DIAGNOSIS — R059 Cough, unspecified: Secondary | ICD-10-CM

## 2017-11-28 DIAGNOSIS — F17200 Nicotine dependence, unspecified, uncomplicated: Secondary | ICD-10-CM

## 2017-11-28 MED ORDER — VALACYCLOVIR HCL 1 G PO TABS: 1000.0000 mg | ORAL_TABLET | Freq: Three times a day (TID) | ORAL | 0 refills | Status: DC

## 2017-11-28 MED ORDER — HYDROCOD POLST-CPM POLST ER 10-8 MG/5ML PO SUER: 5.0000 mL | Freq: Every evening | ORAL | 0 refills | Status: DC | PRN

## 2017-11-28 NOTE — Patient Instructions (Signed)
Take valtrex for herpes outbreak (I suspect this is likely simplex instead of shingles)  Alert [place] if no improvement   Keep rash clean and dry    I refilled tussionex for cough -use extreme caution (habit forming and sedating)

## 2017-11-28 NOTE — Progress Notes (Signed)
This encounter was created in error - please disregard.

## 2017-11-28 NOTE — Assessment & Plan Note (Signed)
Strongly enc smoking cessation She has been able to cut back to 3 cig per day

## 2017-11-28 NOTE — Progress Notes (Signed)
Subjective:    Patient ID: Kristie Miles, female    DOB: 19-Feb-1965, 52 y.o.   MRN: [id number]  HPI Here for ? Shingles on forehead   52 yo pt of NP Baity (has not officially established yet)  Current smoker - 3 cig per day and trying to quit   She has had shingles before (at least 6 times)  ? If this could have actually been HSV instead since it is not severely painful   Has been fighting a virus for 3 weeks - has been tx (uri)  Was on prednisone   Started getting fever blisters yesterday   Felt tenderness on her forehead R side (just broke out today) in blisters  Uncomfortable but not severely painful   Patient Active Problem List   Diagnosis Date Noted  . Herpes simplex 11/28/2017  . GERD (gastroesophageal reflux disease) 04/19/2017  . Smoking 04/19/2017  . Cough 04/19/2017  . Headache disorder 07/26/2016  . History of CVA (cerebrovascular accident) 07/26/2016  . Tremor 07/26/2016   Past Medical History:  Diagnosis Date  . GERD (gastroesophageal reflux disease)   . Smoking    History reviewed. No pertinent surgical history. Social History   Tobacco Use  . Smoking status: Current Every Day Smoker  . Smokeless tobacco: Never Used  . Tobacco comment: less then 1/2 a pack a day.  prev able to tolerate chantix  Substance Use Topics  . Alcohol use: Yes    Comment: social drinker  . Drug use: No   History reviewed. No pertinent family history. Allergies  Allergen Reactions  . Sulfa Antibiotics Rash   Current Outpatient Medications on File Prior to Visit  Medication Sig Dispense Refill  . albuterol (PROVENTIL HFA;VENTOLIN HFA) 108 (90 Base) MCG/ACT inhaler Inhale 2 puffs into the lungs every 6 (six) hours as needed for wheezing or shortness of breath. 1 Inhaler 0  . cetirizine (ZYRTEC) 10 MG tablet     . esomeprazole (NEXIUM) 20 MG capsule Take 1 capsule (20 mg total) by mouth 2 (two) times daily before a meal. 60 capsule 1  . Flaxseed, Linseed, (FLAX SEEDS PO)  Take by mouth.    . fluticasone (FLONASE) 50 MCG/ACT nasal spray 1 SPRAY INTO EACH NOSTRIL DAILY FOR 30 DAYS.  0  . fluticasone (FLOVENT HFA) 44 MCG/ACT inhaler Inhale 2 puffs into the lungs 2 (two) times daily. 1 Inhaler 12  . gabapentin (NEURONTIN) 100 MG capsule Take 200 mg by mouth 2 (two) times daily.     Marland Kitchen PARoxetine (PAXIL) 40 MG tablet Take 40 mg by mouth every morning.    . propranolol ER (INDERAL LA) 160 MG SR capsule Take 1 capsule (160 mg total) by mouth daily. 30 capsule 5  . simvastatin (ZOCOR) 20 MG tablet Take 20 mg by mouth daily.    . traMADol (ULTRAM) 50 MG tablet Take 1 tablet (50 mg total) by mouth every 6 (six) hours as needed. 60 tablet 1  . varenicline (CHANTIX CONTINUING MONTH PAK) 1 MG tablet Take 1 tablet (1 mg total) by mouth 2 (two) times daily. 60 tablet 0   Current Facility-Administered Medications on File Prior to Visit  Medication Dose Route Frequency Provider Last Rate Last Dose  . betamethasone acetate-betamethasone sodium phosphate (CELESTONE) injection 3 mg  3 mg Intramuscular Once Edrick Kins, DPM        Review of Systems  Constitutional: Negative for activity change, appetite change, fatigue, fever and unexpected weight change.  HENT: Negative for  congestion, ear pain, rhinorrhea, sinus pressure and sore throat.   Eyes: Negative for pain, redness and visual disturbance.  Respiratory: Negative for cough, shortness of breath and wheezing.   Cardiovascular: Negative for chest pain and palpitations.  Gastrointestinal: Negative for abdominal pain, blood in stool, constipation and diarrhea.  Endocrine: Negative for polydipsia and polyuria.  Genitourinary: Negative for dysuria, frequency and urgency.  Musculoskeletal: Negative for arthralgias, back pain and myalgias.  Skin: Positive for rash. Negative for pallor.  Allergic/Immunologic: Negative for environmental allergies.  Neurological: Negative for dizziness, syncope and headaches.  Hematological:  Negative for adenopathy. Does not bruise/bleed easily.  Psychiatric/Behavioral: Negative for decreased concentration and dysphoric mood. The patient is not nervous/anxious.        Objective:   Physical Exam  Constitutional: She appears well-developed and well-nourished. No distress.  Well appearing   HENT:  Head: Normocephalic and atraumatic.  Right Ear: External ear normal.  Left Ear: External ear normal.  Nose: Nose normal.  Mouth/Throat: Oropharynx is clear and moist.  Eyes: Pupils are equal, round, and reactive to light. Conjunctivae and EOM are normal. Right eye exhibits no discharge. Left eye exhibits no discharge. No scleral icterus.  Neck: Normal range of motion. Neck supple.  Cardiovascular: Normal rate, regular rhythm and normal heart sounds.  Pulmonary/Chest: Effort normal and breath sounds normal. No respiratory distress. She has no wheezes. She has no rales.  Lymphadenopathy:    She has no cervical adenopathy.  Neurological: She is alert. No cranial nerve deficit.  Skin: Skin is warm and dry. Rash noted.  Diffuse ulcers (consistent with cold sores) on lower lip (outer and inner)   Small area of tiny pink vesicles over R forehead -mildly tender to the touch No open areas   No rash around eyes or nose   Psychiatric: She has a normal mood and affect.          Assessment & Plan:   Problem List Items Addressed This Visit      Other   Smoking    Strongly enc smoking cessation She has been able to cut back to 3 cig per day       Herpes simplex - Primary    Suspect recurrent vesicular rash on R forehead is actually herpes simplex (instead of zoster)-given mild nature of it and number of re occurrences in the same area She also has cold sores on lip-which would further support that  tx with valtrex (per pt this usually wipes it out)  Update if not starting to improve in a week or if worsening        Relevant Medications   valACYclovir (VALTREX) 1000 MG  tablet   Cough    Getting over long uri/viral syndrome Refilled small amt of tussionex for residual cough  Reassuring lung exam

## 2017-11-28 NOTE — Assessment & Plan Note (Signed)
Getting over long uri/viral syndrome Refilled small amt of tussionex for residual cough  Reassuring lung exam

## 2017-11-28 NOTE — Assessment & Plan Note (Signed)
Suspect recurrent vesicular rash on R forehead is actually herpes simplex (instead of zoster)-given mild nature of it and number of re occurrences in the same area She also has cold sores on lip-which would further support that  tx with valtrex (per pt this usually wipes it out)  Update if not starting to improve in a week or if worsening

## 2017-12-10 ENCOUNTER — Telehealth: Payer: Self-pay | Admitting: Podiatry

## 2017-12-10 ENCOUNTER — Encounter: Payer: Self-pay | Admitting: Internal Medicine

## 2017-12-10 ENCOUNTER — Ambulatory Visit (INDEPENDENT_AMBULATORY_CARE_PROVIDER_SITE_OTHER): Payer: Managed Care, Other (non HMO) | Admitting: Internal Medicine

## 2017-12-10 ENCOUNTER — Telehealth: Payer: Self-pay | Admitting: Internal Medicine

## 2017-12-10 VITALS — BP 104/68 | HR 64 | Temp 97.8°F | Ht 64.5 in | Wt 153.0 lb

## 2017-12-10 DIAGNOSIS — B3731 Acute candidiasis of vulva and vagina: Secondary | ICD-10-CM

## 2017-12-10 DIAGNOSIS — R51 Headache: Secondary | ICD-10-CM

## 2017-12-10 DIAGNOSIS — B009 Herpesviral infection, unspecified: Secondary | ICD-10-CM

## 2017-12-10 DIAGNOSIS — Z8673 Personal history of transient ischemic attack (TIA), and cerebral infarction without residual deficits: Secondary | ICD-10-CM | POA: Diagnosis not present

## 2017-12-10 DIAGNOSIS — R251 Tremor, unspecified: Secondary | ICD-10-CM | POA: Diagnosis not present

## 2017-12-10 DIAGNOSIS — R519 Headache, unspecified: Secondary | ICD-10-CM

## 2017-12-10 DIAGNOSIS — E7800 Pure hypercholesterolemia, unspecified: Secondary | ICD-10-CM

## 2017-12-10 DIAGNOSIS — F39 Unspecified mood [affective] disorder: Secondary | ICD-10-CM

## 2017-12-10 DIAGNOSIS — K219 Gastro-esophageal reflux disease without esophagitis: Secondary | ICD-10-CM | POA: Diagnosis not present

## 2017-12-10 DIAGNOSIS — J45909 Unspecified asthma, uncomplicated: Secondary | ICD-10-CM | POA: Insufficient documentation

## 2017-12-10 DIAGNOSIS — J4520 Mild intermittent asthma, uncomplicated: Secondary | ICD-10-CM

## 2017-12-10 DIAGNOSIS — E785 Hyperlipidemia, unspecified: Secondary | ICD-10-CM | POA: Insufficient documentation

## 2017-12-10 DIAGNOSIS — N3000 Acute cystitis without hematuria: Secondary | ICD-10-CM | POA: Diagnosis not present

## 2017-12-10 DIAGNOSIS — B373 Candidiasis of vulva and vagina: Secondary | ICD-10-CM

## 2017-12-10 MED ORDER — ESOMEPRAZOLE MAGNESIUM 20 MG PO CPDR: 20.0000 mg | DELAYED_RELEASE_CAPSULE | Freq: Two times a day (BID) | ORAL | 1 refills | Status: DC

## 2017-12-10 MED ORDER — PROPRANOLOL HCL ER 160 MG PO CP24: 160.0000 mg | ORAL_CAPSULE | Freq: Every day | ORAL | 1 refills | Status: DC

## 2017-12-10 MED ORDER — PAROXETINE HCL 40 MG PO TABS: 40.0000 mg | ORAL_TABLET | ORAL | 1 refills | Status: DC

## 2017-12-10 MED ORDER — SIMVASTATIN 20 MG PO TABS: 20.0000 mg | ORAL_TABLET | Freq: Every day | ORAL | 1 refills | Status: DC

## 2017-12-10 MED ORDER — GABAPENTIN 100 MG PO CAPS: 200.0000 mg | ORAL_CAPSULE | Freq: Two times a day (BID) | ORAL | 1 refills | Status: DC

## 2017-12-10 NOTE — Assessment & Plan Note (Signed)
Discussed avoiding foods that trigger her reflux Esomeprazole refilled today

## 2017-12-10 NOTE — Patient Instructions (Signed)

## 2017-12-10 NOTE — Assessment & Plan Note (Signed)
No residual effect On statin and ASA Will monitor

## 2017-12-10 NOTE — Progress Notes (Signed)
HPI  Pt presents to the clinic today to establish care and for management of the conditions listed below. She is transferring care from Dr. Kary Kos.  GERD: Triggered by everything she eats. She is taking Esomeprazole as prescribed. She has never had an upper GI.   HLD with Hx of CVA: No residual effect. There is no LDL on file. She is taking Simvastatin as prescribed. She takes baby ASA daily. She tries to consume a low fat diet but reports she could be better at it.  Herpes: Usually occurs on her forehead and lips. She has not had an outbreak in many months. She is not on suppressive therapy at this time.  Asthma: Mild Intermittent. Controlled with Albuterol prn. There is no PFT's on file.   Frequent Headaches: Occur daily. She takes Propanolol and Gabapentin as prescribed. She is currently following with neurology.  Tremor: Essential. Controlled on Propanolol. She is currently following with neurology.  Mood Disorder: Mainly stress, anxiety. Controlled on Paxil. She denies depression, SI/HI.  She also c/o dysuria and vaginal itching. She reports this started 2 days ago. She did an E Visit and was prescribed Macrobid and Diflucan. She has also taken AZO OTC. She wanted to know if any further intervention was needed at this time.  Flu: 09/2017 Tetanus: > 10 years ago Pap Smear: 10/2017, [facility] Mammogram: 11/2017 Colon Screening: never Vision Screening: as needed Dentist: as needed   Past Medical History:  Diagnosis Date  . GERD (gastroesophageal reflux disease)   . Smoking     Current Outpatient Medications  Medication Sig Dispense Refill  . albuterol (PROVENTIL HFA;VENTOLIN HFA) 108 (90 Base) MCG/ACT inhaler Inhale 2 puffs into the lungs every 6 (six) hours as needed for wheezing or shortness of breath. 1 Inhaler 0  . cetirizine (ZYRTEC) 10 MG tablet     . esomeprazole (NEXIUM) 20 MG capsule Take 1 capsule (20 mg total) by mouth 2 (two) times daily before a meal. 60  capsule 1  . Flaxseed, Linseed, (FLAX SEEDS PO) Take by mouth.    . fluticasone (FLONASE) 50 MCG/ACT nasal spray 1 SPRAY INTO EACH NOSTRIL DAILY FOR 30 DAYS.  0  . fluticasone (FLOVENT HFA) 44 MCG/ACT inhaler Inhale 2 puffs into the lungs 2 (two) times daily. 1 Inhaler 12  . gabapentin (NEURONTIN) 100 MG capsule Take 200 mg by mouth 2 (two) times daily.     Marland Kitchen PARoxetine (PAXIL) 40 MG tablet Take 40 mg by mouth every morning.    . propranolol ER (INDERAL LA) 160 MG SR capsule Take 1 capsule (160 mg total) by mouth daily. 30 capsule 5  . simvastatin (ZOCOR) 20 MG tablet Take 20 mg by mouth daily.    . traMADol (ULTRAM) 50 MG tablet Take 1 tablet (50 mg total) by mouth every 6 (six) hours as needed. 60 tablet 1  . valACYclovir (VALTREX) 1000 MG tablet Take 1 tablet (1,000 mg total) by mouth 3 (three) times daily. 21 tablet 0  . varenicline (CHANTIX CONTINUING MONTH PAK) 1 MG tablet Take 1 tablet (1 mg total) by mouth 2 (two) times daily. 60 tablet 0   Current Facility-Administered Medications  Medication Dose Route Frequency Provider Last Rate Last Dose  . betamethasone acetate-betamethasone sodium phosphate (CELESTONE) injection 3 mg  3 mg Intramuscular Once Edrick Kins, DPM        Allergies  Allergen Reactions  . Sulfa Antibiotics Rash    No family history on file.  Social History  Socioeconomic History  . Marital status: Single    Spouse name: Not on file  . Number of children: Not on file  . Years of education: Not on file  . Highest education level: Not on file  Occupational History  . Not on file  Social Needs  . Financial resource strain: Not on file  . Food insecurity:    Worry: Not on file    Inability: Not on file  . Transportation needs:    Medical: Not on file    Non-medical: Not on file  Tobacco Use  . Smoking status: Current Every Day Smoker  . Smokeless tobacco: Never Used  . Tobacco comment: less then 1/2 a pack a day.  prev able to tolerate chantix   Substance and Sexual Activity  . Alcohol use: Yes    Comment: social drinker  . Drug use: No  . Sexual activity: Not on file  Lifestyle  . Physical activity:    Days per week: Not on file    Minutes per session: Not on file  . Stress: Not on file  Relationships  . Social connections:    Talks on phone: Not on file    Gets together: Not on file    Attends religious service: Not on file    Active member of club or organization: Not on file    Attends meetings of clubs or organizations: Not on file    Relationship status: Not on file  . Intimate partner violence:    Fear of current or ex partner: Not on file    Emotionally abused: Not on file    Physically abused: Not on file    Forced sexual activity: Not on file  Other Topics Concern  . Not on file  Social History Narrative  . Not on file    ROS:  Constitutional: Pt reports daily headaches. Denies fever, malaise, fatigue, or abrupt weight changes.  HEENT: Denies eye pain, eye redness, ear pain, ringing in the ears, wax buildup, runny nose, nasal congestion, bloody nose, or sore throat. Respiratory: Denies difficulty breathing, shortness of breath, cough or sputum production.   Cardiovascular: Denies chest pain, chest tightness, palpitations or swelling in the hands or feet.  Gastrointestinal: Pt reports reflux. Denies abdominal pain, bloating, constipation, diarrhea or blood in the stool.  GU: Denies frequency, urgency, pain with urination, blood in urine, odor or discharge. Musculoskeletal: Denies decrease in range of motion, difficulty with gait, muscle pain or joint pain and swelling.  Skin: Denies redness, rashes, lesions or ulcercations.  Neurological: Pt reports tremor. Denies dizziness, difficulty with memory, difficulty with speech or problems with balance and coordination.  Psych: Pt reports anxiety. Denies depression, SI/HI.  No other specific complaints in a complete review of systems (except as listed in HPI  above).  PE:  BP 104/68   Pulse 64   Temp 97.8 F (36.6 C) (Oral)   Ht 5' 4.5" (1.638 m)   Wt 153 lb (69.4 kg)   SpO2 97%   BMI 25.86 kg/m   Wt Readings from Last 3 Encounters:  12/10/17 153 lb (69.4 kg)  11/28/17 159 lb 12 oz (72.5 kg)  11/22/17 156 lb (70.8 kg)    General: Appears her stated age, well developed, well nourished in NAD. Cardiovascular: Normal rate and rhythm. S1,S2 noted.  No murmur, rubs or gallops noted. No JVD or BLE edema. No carotid bruits noted. Pulmonary/Chest: Normal effort and positive vesicular breath sounds. No respiratory distress. No wheezes, rales or  ronchi noted.  Abdomen: Soft and nontender. Normal bowel sounds, no bruits noted. No distention or masses noted.  Musculoskeletal:  No difficulty with gait.  Neurological: Alert and oriented. Cranial nerves II-XII grossly intact. Coordination normal.  Psychiatric: Mood and affect normal. Behavior is normal. Judgment and thought content normal.     Assessment and Plan:  UTI/Vaginal Candida:  Advised her to continue Macrobid and Diflucan as prescribed Push fluids Let me know if no improvement  Make an appt in 6 months for your annual exam Webb Silversmith, NP

## 2017-12-10 NOTE — Assessment & Plan Note (Signed)
Not on suppressive therapy Will treat outbreaks only.

## 2017-12-10 NOTE — Assessment & Plan Note (Signed)
Continue Albuterol as needed Will monitor

## 2017-12-10 NOTE — Assessment & Plan Note (Signed)
Controlled on Propanolol, refilled today She will continue to follow with neurology

## 2017-12-10 NOTE — Telephone Encounter (Signed)
Pt called office stating she spoke with [facility] and they told her the medication [facility] sent in need to be mail order. Pt uses [facility]. Pt is also requesting Rollene Fare to prescribe her Valtrex.

## 2017-12-10 NOTE — Assessment & Plan Note (Signed)
Mainly anxiety Stable on Paxil, refilled today Will monitor

## 2017-12-10 NOTE — Telephone Encounter (Signed)
Pt left message asking about some pads she was given in our office by Dr Amalia Hailey and where could she get them.  I reviewed chart and she was given met pads.  I called pt and let her know that they are called met pads and we sell them in the office.

## 2017-12-10 NOTE — Assessment & Plan Note (Signed)
Continue Simvastatin Will check lipids with annual exam Encouraged her to consume a low fat diet

## 2017-12-10 NOTE — Assessment & Plan Note (Signed)
Stable on Propanolol and Gabapentin, refilled today She will continue to follow with neurology

## 2017-12-11 MED ORDER — ESOMEPRAZOLE MAGNESIUM 20 MG PO CPDR: 20.0000 mg | DELAYED_RELEASE_CAPSULE | Freq: Two times a day (BID) | ORAL | 1 refills | Status: DC

## 2017-12-11 NOTE — Addendum Note (Signed)
Addended by: Lurlean Nanny on: 12/11/2017 04:34 PM   Modules accepted: Orders

## 2017-12-11 NOTE — Telephone Encounter (Signed)
Nexium resent to mail order... Valtrex dose is not on current list... Please advise on dose and sig to send to mail order per pt request

## 2017-12-12 MED ORDER — VALACYCLOVIR HCL 500 MG PO TABS: 500.0000 mg | ORAL_TABLET | Freq: Two times a day (BID) | ORAL | 0 refills | Status: DC | PRN

## 2017-12-12 NOTE — Telephone Encounter (Signed)
RX sen to [facility]

## 2017-12-12 NOTE — Addendum Note (Signed)
Addended by: Jearld Fenton on: 12/12/2017 12:09 PM   Modules accepted: Orders

## 2018-01-12 ENCOUNTER — Other Ambulatory Visit: Payer: Self-pay | Admitting: Internal Medicine

## 2018-01-19 ENCOUNTER — Other Ambulatory Visit: Payer: Self-pay | Admitting: Internal Medicine

## 2018-01-25 NOTE — Telephone Encounter (Signed)
June from [facility] called to see if could get valacyclovir refilled; advised too early to fill and June said would d/c this refill request. Nothing further needed.

## 2018-02-26 ENCOUNTER — Ambulatory Visit (INDEPENDENT_AMBULATORY_CARE_PROVIDER_SITE_OTHER): Payer: Managed Care, Other (non HMO) | Admitting: Podiatry

## 2018-02-26 ENCOUNTER — Ambulatory Visit (INDEPENDENT_AMBULATORY_CARE_PROVIDER_SITE_OTHER): Payer: Managed Care, Other (non HMO)

## 2018-02-26 ENCOUNTER — Encounter: Payer: Self-pay | Admitting: Podiatry

## 2018-02-26 DIAGNOSIS — M7741 Metatarsalgia, right foot: Secondary | ICD-10-CM | POA: Diagnosis not present

## 2018-02-26 DIAGNOSIS — M7742 Metatarsalgia, left foot: Secondary | ICD-10-CM | POA: Diagnosis not present

## 2018-02-26 DIAGNOSIS — M7751 Other enthesopathy of right foot: Secondary | ICD-10-CM | POA: Diagnosis not present

## 2018-02-26 DIAGNOSIS — S99921A Unspecified injury of right foot, initial encounter: Secondary | ICD-10-CM

## 2018-02-26 DIAGNOSIS — M7752 Other enthesopathy of left foot: Secondary | ICD-10-CM

## 2018-02-26 MED ORDER — HYDROCODONE-ACETAMINOPHEN 5-325 MG PO TABS: 1.0000 | ORAL_TABLET | Freq: Four times a day (QID) | ORAL | 0 refills | Status: DC | PRN

## 2018-02-26 MED ORDER — METHYLPREDNISOLONE 4 MG PO TBPK: ORAL_TABLET | ORAL | 0 refills | Status: DC

## 2018-03-09 NOTE — Progress Notes (Signed)
   HPI: Patient well established with the practice presents today for a new complaint regarding an injury to the bilateral feet.  She says that ever since surgery on 08/09/2017 she is continued to have a lot of pain in the bottoms of both of her feet.  Patient states that approximately 3 days ago she possibly had an injury.  She says that she was moving on 02/23/2018 and she is not sure what she did to her foot but now it is very painful when walking.  She experiences sharp shooting sensations when walking.  She has been taking tramadol and meloxicam with minimal relief.  Surgery on 08/09/2016 consisted of EPF bilateral and dorsal exostectomy right foot.  Past Medical History:  Diagnosis Date  . Allergy   . Asthma   . GERD (gastroesophageal reflux disease)   . Hyperlipidemia   . Smoking      Physical Exam: General: The patient is alert and oriented x3 in no acute distress.  Dermatology: Skin is warm, dry and supple bilateral lower extremities. Negative for open lesions or macerations.  Vascular: Palpable pedal pulses bilaterally. No edema or erythema noted. Capillary refill within normal limits.  Neurological: Epicritic and protective threshold grossly intact bilaterally.   Musculoskeletal Exam: Pain with palpation to the bilateral metatarsals. Range of motion within normal limits to all pedal and ankle joints bilateral. Muscle strength 5/5 in all groups bilateral.  There is a significant amount of pain on palpation along the first metatarsal areas bilaterally.  Right greater than left.  Radiographic exam: Normal osseous mineralization.  Joint spaces are preserved.  No identification of the cortical disruption or stress fracture.  Normal osseous alignment.  Assessment: 1. Metatarsalgia bilateral/first metatarsal pain bilateral R>L 2. EPF bilateral, exostectomy right. DOS: 08/09/17   Plan of Care:  1. Patient evaluated.   2.  Injection of 0.5 cc Celestone Soluspan injected along the soft  tissue structures of the first metatarsals bilateral.  I do not suspect that the patient sustained a stress fracture of the first metatarsal since is much more robust and less likely. 3.  Prescription for Medrol Dosepak, then continue meloxicam daily 4.  Prescription for Vicodin 5/3 and 25 mg #30 5.  Resume weightbearing in the immobilization cam boot 6.  Return to clinic in 3 weeks     Edrick Kins, DPM [facility]  Dr. Edrick Kins, DPM    [address], [place] [address]                Office [phone]  Fax [phone]

## 2018-03-14 ENCOUNTER — Ambulatory Visit: Payer: Managed Care, Other (non HMO) | Admitting: Neurology

## 2018-03-14 ENCOUNTER — Other Ambulatory Visit: Payer: Self-pay

## 2018-03-14 ENCOUNTER — Encounter: Payer: Self-pay | Admitting: Neurology

## 2018-03-14 VITALS — BP 131/86 | HR 55 | Ht 64.0 in | Wt 149.0 lb

## 2018-03-14 DIAGNOSIS — G250 Essential tremor: Secondary | ICD-10-CM | POA: Diagnosis not present

## 2018-03-14 MED ORDER — PRIMIDONE 50 MG PO TABS: ORAL_TABLET | ORAL | 5 refills | Status: DC

## 2018-03-14 MED ORDER — PROPRANOLOL HCL ER 160 MG PO CP24: 160.0000 mg | ORAL_CAPSULE | Freq: Every day | ORAL | 3 refills | Status: DC

## 2018-03-14 NOTE — Progress Notes (Signed)
Subjective:    Patient ID: Kristie Miles is a 53 y.o. female.  HPI     Interim history:   Kristie Miles is a 53 year old right-handed woman with an underlying medical history of reflux disease, hyperlipidemia, migraine headaches, smoking, depression and mildly overweight state, who presents for follow-up consultation of her essential tremor. The patient is unaccompanied today. I first met her on 09/06/2017 at the request of her primary care physician, at which time she reported in at least 10 year history of hand tremors. She was on propranolol at the time. She was on completely sure about the dose and I suggested we increase it from what she thought she was on at the time. She called back later reporting that she was taking propranolol 60 mg twice a day. I suggested we change the prescription to 160 mg once daily long-acting. She had previously followed with Dr. Melrose Nakayama in neurology.  Today, 03/14/2018: She reports that her tremor may have become worse. She does believe the beta blocker is helping but is worried that she has had some progression. Of note, she has had more stress lately. [place] She has not been able to quit smoking and has given up on it currently because of increase in stress reported. She seems to tolerate the beta blocker fine. She has noticed that her tremor significantly flares up if skips a dose or forgets it or runs out of the prescription.  The patient's allergies, current medications, family history, past medical history, past social history, past surgical history and problem list were reviewed and updated as appropriate.   Previously:   09/06/2017: (She) reports in at least 10 year history of hand tremors. She recalls that she had a mild tremor in her early 55s. Her tremor has progressed with time. I reviewed your office note from 07/16/2017. She has been on propranolol for tremors and previously followed with Dr. Melrose Nakayama at [facility]  clinic neurology. She feels that the propranolol is not as effective as it used to be. She has a tremor in both hands. She works for [employer]. She is engaged and lives with her fianc and their kids. She has 1 biological daughter, age 59. She has noticed that her daughter has had some hand tremors. Her mother developed tremor later in life and is on propranolol as well. Patient is trying to quit smoking. She smokes half a pack per day, drinks alcohol about 3 days out of the week, typically in the form of wine, a little more consistently on the weekends. She has not actually noticed any improvement in her tremor after consuming alcohol. She is trying to cut back because of recent weight gain. She is typically well rested and well hydrated with water. She currently works from home as she had right foot surgery for bone spur and plantar fasciitis in August 2019. She had left foot surgery in November 2018. She is currently wearing a boot on the right foot. She is currently on gabapentin 200 mg twice a day for migraine prevention which has worked well for her. She has tried Topamax in the distant past it looks like. She is on generic Paxil 40 mg daily for her depression which has worked well for her. In the past, she was told that she had mini strokes. She was on baby aspirin for years, she recently stopped taking it. I did encourage her to go back on it especially as she did well taking it, no  side effects reported. She was also told in the past that she had low vitamin D and has been on an over-the-counter vitamin D supplement which she currently does not use a regular basis. She has not had her vitamin D checked in years as I understand.  Her Past Medical History Is Significant For: Past Medical History:  Diagnosis Date  . Allergy   . Asthma   . GERD (gastroesophageal reflux disease)   . Hyperlipidemia   . Smoking     Her Past Surgical History Is Significant For: Past Surgical History:  Procedure  Laterality Date  . FOOT SURGERY Bilateral     Her Family History Is Significant For: Family History  Problem Relation Age of Onset  . Arthritis Mother   . Hyperlipidemia Mother   . Hypertension Mother   . Asthma Father   . Kidney cancer Father   . Kidney disease Father   . Alcohol abuse Maternal Grandmother   . Arthritis Maternal Grandmother   . Heart disease Maternal Grandfather   . Arthritis Paternal Grandmother   . Asthma Paternal Grandmother     Her Social History Is Significant For: Social History   Socioeconomic History  . Marital status: Single    Spouse name: Not on file  . Number of children: Not on file  . Years of education: Not on file  . Highest education level: Not on file  Occupational History  . Not on file  Social Needs  . Financial resource strain: Not on file  . Food insecurity:    Worry: Not on file    Inability: Not on file  . Transportation needs:    Medical: Not on file    Non-medical: Not on file  Tobacco Use  . Smoking status: Current Every Day Smoker    Packs/day: 0.50    Types: Cigarettes  . Smokeless tobacco: Never Used  . Tobacco comment: less then 1/2 a pack a day.  prev able to tolerate chantix  Substance and Sexual Activity  . Alcohol use: Yes    Comment: social drinker  . Drug use: No  . Sexual activity: Not on file  Lifestyle  . Physical activity:    Days per week: Not on file    Minutes per session: Not on file  . Stress: Not on file  Relationships  . Social connections:    Talks on phone: Not on file    Gets together: Not on file    Attends religious service: Not on file    Active member of club or organization: Not on file    Attends meetings of clubs or organizations: Not on file    Relationship status: Not on file  Other Topics Concern  . Not on file  Social History Narrative  . Not on file    Her Allergies Are:  Allergies  Allergen Reactions  . Latex Itching  . Sulfa Antibiotics Rash  :   Her Current  Medications Are:  Outpatient Encounter Medications as of 03/14/2018  Medication Sig  . albuterol (PROVENTIL HFA;VENTOLIN HFA) 108 (90 Base) MCG/ACT inhaler Inhale 2 puffs into the lungs every 6 (six) hours as needed for wheezing or shortness of breath.  . esomeprazole (NEXIUM) 20 MG capsule Take 1 capsule (20 mg total) by mouth 2 (two) times daily before a meal.  . Flaxseed, Linseed, (FLAX SEEDS PO) Take by mouth.  . Nutritional Supplements (ESTROVEN PO) Take by mouth.  Marland Kitchen PARoxetine (PAXIL) 40 MG tablet Take 1 tablet (40  mg total) by mouth every morning.  . propranolol ER (INDERAL LA) 160 MG SR capsule Take 1 capsule (160 mg total) by mouth daily.  . varenicline (CHANTIX CONTINUING MONTH PAK) 1 MG tablet Take 1 tablet (1 mg total) by mouth 2 (two) times daily.  . [DISCONTINUED] propranolol ER (INDERAL LA) 160 MG SR capsule Take 160 mg by mouth daily.  . [DISCONTINUED] propranolol ER (INDERAL LA) 60 MG 24 hr capsule Take 60 mg by mouth daily.  . primidone (MYSOLINE) 50 MG tablet 1/2 pill each bedtime x 2 weeks, then 1 pill nightly x 2 weeks, then 1 1/2 pills nightly x 2 weeks, then 2 pills nightly thereafter.  . [DISCONTINUED] gabapentin (NEURONTIN) 100 MG capsule Take 2 capsules (200 mg total) by mouth 2 (two) times daily.  . [DISCONTINUED] HYDROcodone-acetaminophen (NORCO/VICODIN) 5-325 MG tablet Take 1 tablet by mouth every 6 (six) hours as needed for moderate pain.  . [DISCONTINUED] methylPREDNISolone (MEDROL DOSEPAK) 4 MG TBPK tablet 6 day dose pack - take as directed  . [DISCONTINUED] simvastatin (ZOCOR) 20 MG tablet Take 1 tablet (20 mg total) by mouth daily.   Facility-Administered Encounter Medications as of 03/14/2018  Medication  . betamethasone acetate-betamethasone sodium phosphate (CELESTONE) injection 3 mg  :  Review of Systems:  Out of a complete 14 point review of systems, all are reviewed and negative with the exception of these symptoms as listed below: Review of Systems   Neurological:       Pt presents today to discuss her tremors. Pt says her tremors are worsening and is not sure if the propranolol 165m is working, or if her tremors are just getting worse.    Objective:  Neurological Exam  Physical Exam Physical Examination:   Vitals:   03/14/18 1142  BP: 131/86  Pulse: (!) 55   General Examination: The patient is a very pleasant 53y.o. female in no acute distress. She appears well-developed and well-nourished and well groomed.   HEENT: Normocephalic, atraumatic, pupils are equal, round and reactive to light and accommodation. Extraocular tracking is good without limitation to gaze excursion or nystagmus noted. Normal smooth pursuit is noted. Hearing is grossly intact. Face is symmetric with normal facial animation and normal facial sensation. Speech is clear with no dysarthria noted. There is no hypophonia. Is no obvious voice or lip tremor. She has a very mild head tremor. Neck is supple with full range of motion. She has no carotid bruits. Airway examination is benign. Tongue protrudes centrally and palate elevates symmetrically.  Chest: Clear to auscultation without wheezing, rhonchi or crackles noted.  Heart: S1+S2+0, regular and normal without murmurs, rubs or gallops noted.   Abdomen: Soft, non-tender and non-distended with normal bowel sounds appreciated on auscultation.  Extremities: There is no pitting edema in the distal lower extremities bilaterally.   Skin: Warm and dry without trophic changes noted.   Musculoskeletal: exam reveals no obvious joint deformities, tenderness or joint swelling or erythema.   Neurologically:  Mental status: The patient is awake, alert and oriented in all 4 spheres. Her immediate and remote memory, attention, language skills and fund of knowledge are appropriate. There is no evidence of aphasia, agnosia, apraxia or anomia. Speech is clear with normal prosody and enunciation. Thought process is  linear. Mood is normal and affect is normal.  Cranial nerves II - XII are as described above under HEENT exam. In addition: shoulder shrug is normal with equal shoulder height noted. Motor exam: Normal bulk, strength and tone  is noted. There is no drift, resting tremor or rebound. She has a mild bilateral upper extremity postural and slight action tremor, slightly worse on the left than right. She has no lower extremity tremors.  (On 09/06/2017: Archimedes spiral drawing she has minimal trembling with both hands. Handwriting with her right hand is legible, not particularly tremulous, not micrographic.)  Romberg is negative. Reflexes are 1+ throughout. Fine motor skills and coordination: intact with normal finger taps, normal hand movements, normal rapid alternating patting, normal foot taps and normal foot agility.  Cerebellar testing: No dysmetria or intention tremor. No ataxia.   Sensory exam: intact to light touch in the upper and lower extremities.  Gait, station and balance: She stands easily. No veering to one side is noted. No leaning to one side is noted. Posture is age-appropriate and stance is narrow based. Gait shows abnormality, preserved arm swing, tandem walk is good.   Assessment and Miles:  In summary, Kristie Miles is a very pleasant 53 year old female  with an underlying medical history of reflux disease, hyperlipidemia, migraine headaches, smoking, depression and borderline overweight state, who presents for follow-up consultation of her essential tremor. She has previously responded to propranolol, she is currently on long-acting propranolol 160 mg daily. She does tolerate it well and feels that it is still helping but her tremor has become worse. She does report recent increase in stress which also prevented her from pursuing smoking cessation she admits. I suggested she continue with the beta blocker at the current dose. I would not miss early favor increasing it because her pulse  rate is already 55 so on the lower side. She is advised to consider a second medication as an add-on. I would like for her to try Mysoline/primidone 50 motor strength, we will start slowly with half a pill at night and gradually increase in weekly increments to up to 2 pills at night. She was advised regarding potential side effects and cautions in particular the sedating properties of the medication. She is willing to try it. She was provided a written instruction a new prescription. Furthermore I renewed her beta blocker prescription for 90 day prescription with refills. She is getting married next month. Hopefully, she will see some reduction of her tremor in the interim and be able to tolerate the medication, she is encouraged to call with any interim questions or concerns. She is reassured that I do not see any signs of parkinsonism. She is encouraged to stay well hydrated, well rested and quit smoking.  I answered all her questions today and she was in agreement.

## 2018-03-14 NOTE — Patient Instructions (Addendum)
Let's try to add on a second medication for your tremor. Mysoline (primidone) 50 mg strength: Take 1/2 pill each bedtime for 2 weeks, then 1 pill each bedtime for 2 weeks, then 1 1/2 pills each bedtime for 2 weeks, then 2 pills each bedtime thereafter. Common side effects reported are: Sleepiness, drowsiness, balance problems, confusion, and GI related symptoms.  You can continue with the long acting propranolol 160 mg daily.   Good luck with your wedding!

## 2018-03-19 ENCOUNTER — Other Ambulatory Visit: Payer: Self-pay

## 2018-03-19 ENCOUNTER — Encounter: Payer: Self-pay | Admitting: Podiatry

## 2018-03-19 ENCOUNTER — Ambulatory Visit: Payer: Managed Care, Other (non HMO) | Admitting: Podiatry

## 2018-03-19 DIAGNOSIS — M7751 Other enthesopathy of right foot: Secondary | ICD-10-CM

## 2018-03-19 DIAGNOSIS — G5791 Unspecified mononeuropathy of right lower limb: Secondary | ICD-10-CM

## 2018-03-19 MED ORDER — HYDROCODONE-ACETAMINOPHEN 5-325 MG PO TABS: 1.0000 | ORAL_TABLET | Freq: Four times a day (QID) | ORAL | 0 refills | Status: DC | PRN

## 2018-03-19 MED ORDER — GABAPENTIN 100 MG PO CAPS: 100.0000 mg | ORAL_CAPSULE | Freq: Three times a day (TID) | ORAL | 1 refills | Status: DC

## 2018-03-21 NOTE — Progress Notes (Signed)
    HPI: 53 year old female presenting today for follow up evaluation of bilateral foot pain. She states the pain is unchanged. She has been using the CAM boot but states she is still experiencing pain. She has taken a Medrol Dose Pak and has been taking Meloxicam and Vicodin with some relief. Being on her feet for long periods of time increases the pain. Patient is here for further evaluation and treatment.  Past Medical History:  Diagnosis Date  . Allergy   . Asthma   . GERD (gastroesophageal reflux disease)   . Hyperlipidemia   . Smoking        Physical Exam: General: The patient is alert and oriented x3 in no acute distress.  Dermatology: Skin is warm, dry and supple bilateral lower extremities. Negative for open lesions or macerations.  Vascular: Palpable pedal pulses bilaterally. No edema or erythema noted. Capillary refill within normal limits.  Neurological: Epicritic and protective threshold grossly intact bilaterally. Positive Tinel sign with percussion of the deep peroneals.  Musculoskeletal Exam: Pain on palpation noted to the posterior tibial tendon of the bilateral feet. Range of motion within normal limits. Muscle strength 5/5 in all muscle groups bilateral lower extremities.  Assessment: 1. Insertional posterior tibial tendinitis bilateral, right greater than left  2. Neuritis right foot    Plan of Care:  1. Patient was evaluated.  2. Plantar fascial brace dispensed for right foot to support PT tendon.  3. Prescription for Gabapentin 100 mg TID provided to patient.  4. If not better in 3 weeks, we will order MRI of the right foot.  5. Continue taking Meloxicam daily.  6. Refill prescription for Vicodin 5/325 mg provided to patient.  7. Return to clinic after MRI results.   Edrick Kins, DPM [facility]  Dr. Edrick Kins, [address]                                        [place], [place] [address]                Office [phone]  Fax [phone]

## 2018-03-25 ENCOUNTER — Other Ambulatory Visit: Payer: Self-pay | Admitting: Podiatry

## 2018-03-25 DIAGNOSIS — M7751 Other enthesopathy of right foot: Secondary | ICD-10-CM

## 2018-03-27 ENCOUNTER — Other Ambulatory Visit: Payer: Self-pay | Admitting: Podiatry

## 2018-04-05 ENCOUNTER — Other Ambulatory Visit: Payer: Self-pay | Admitting: Family Medicine

## 2018-04-10 ENCOUNTER — Other Ambulatory Visit: Payer: Self-pay | Admitting: Podiatry

## 2018-06-10 ENCOUNTER — Other Ambulatory Visit: Payer: Self-pay

## 2018-06-10 MED ORDER — PRIMIDONE 50 MG PO TABS: 100.0000 mg | ORAL_TABLET | Freq: Every day | ORAL | 0 refills | Status: DC

## 2018-06-28 ENCOUNTER — Telehealth: Payer: Self-pay | Admitting: Internal Medicine

## 2018-06-28 MED ORDER — PAROXETINE HCL 40 MG PO TABS: 40.0000 mg | ORAL_TABLET | ORAL | 0 refills | Status: DC

## 2018-06-28 NOTE — Telephone Encounter (Signed)
I have not received request from pharmacy... Rx sent through e-scribe

## 2018-06-28 NOTE — Telephone Encounter (Signed)
Best number [phone] Pt called stating she is out of her paxil She stated the pharmacy said they faxed request early this week  [facility]   [address] [place] [phone]

## 2018-07-04 ENCOUNTER — Other Ambulatory Visit: Payer: Self-pay | Admitting: Internal Medicine

## 2018-07-06 NOTE — Telephone Encounter (Signed)
Last filled 12/2017... note stating pt needs to schedule CPE.Marland KitchenMarland Kitchen

## 2018-07-24 ENCOUNTER — Other Ambulatory Visit: Payer: Self-pay | Admitting: Internal Medicine

## 2018-07-29 ENCOUNTER — Other Ambulatory Visit: Payer: Self-pay | Admitting: Neurology

## 2018-09-10 ENCOUNTER — Encounter: Payer: Self-pay | Admitting: Neurology

## 2018-09-10 ENCOUNTER — Ambulatory Visit: Payer: Managed Care, Other (non HMO) | Admitting: Neurology

## 2018-09-10 ENCOUNTER — Other Ambulatory Visit: Payer: Self-pay

## 2018-09-10 VITALS — BP 128/80 | HR 68 | Temp 97.1°F | Ht 64.5 in | Wt 143.0 lb

## 2018-09-10 DIAGNOSIS — R419 Unspecified symptoms and signs involving cognitive functions and awareness: Secondary | ICD-10-CM

## 2018-09-10 DIAGNOSIS — G250 Essential tremor: Secondary | ICD-10-CM

## 2018-09-10 NOTE — Progress Notes (Signed)
Subjective:    Patient ID: Kristie Miles is a 53 y.o. female.  HPI     Interim history:   Kristie Miles is a 53 year old right-handed woman with an underlying medical history of reflux disease, hyperlipidemia, migraine headaches, smoking, depression and mildly overweight state, who presents for follow-up consultation of her essential tremor. The patient is unaccompanied today. I last saw her on 03/14/2018, at which time she reported that her tremor was worse.  She was on her beta-blocker.  She was particularly stressed out because of her wedding that was planned for April.  She was able to tolerate the beta-blocker.  I suggested we add Mysoline to her regimen, starting at 50 mg strength half a pill with gradual titration.  Today, 09/10/2018: She reports that the Mysoline has been helpful.  She has been taking 100 mg each night.  She denies any significant side effects such as headaches or sleepiness.  She reports that her balance has not been very good but this is not a new problem since the primidone.  She has had some short-term memory issues, forgetfulness and forgetting conversations.  She is somewhat worried about this.  She has not had a physical with her primary care nurse practitioner and has not had any recent blood work.  She continues to take the propranolol.  She limits her caffeine to 1 serving of green tea and otherwise water.  She tries to hydrate well.  She does drink alcohol daily in the form of wine, 1 to 2 glasses/day on average.  She is motivated to quit smoking and also to curb her alcohol intake.  She has no family history of dementia.  Father died young with cancer at age 83, mother is 87 years old and has a tremor but good memory and neither grandparents set had any dementia as far as she recalls.  She is due for an eye examination, it has been a year, she has prescription eyeglasses.   The patient's allergies, current medications, family history, past medical history, past social  history, past surgical history and problem list were reviewed and updated as appropriate.    Previously:   I first met her on 09/06/2017 at the request of her primary care physician, at which time she reported in at least 10 year history of hand tremors. She was on propranolol at the time. She was on completely sure about the dose and I suggested we increase it from what she thought she was on at the time. She called back later reporting that she was taking propranolol 60 mg twice a day. I suggested we change the prescription to 160 mg once daily long-acting. She had previously followed with Dr. Melrose Nakayama in neurology.    09/06/2017: (She) reports in at least 10 year history of hand tremors. She recalls that she had a mild tremor in her early 74s. Her tremor has progressed with time. I reviewed your office note from 07/16/2017. She has been on propranolol for tremors and previously followed with Dr. Melrose Nakayama at [facility] neurology. She feels that the propranolol is not as effective as it used to be. She has a tremor in both hands. She works for [employer]. She is engaged and lives with her fianc and their kids. She has 1 biological daughter, age 70. She has noticed that her daughter has had some hand tremors. Her mother developed tremor later in life and is on propranolol as well. Patient is trying to quit smoking. She smokes half a pack per  day, drinks alcohol about 3 days out of the week, typically in the form of wine, a little more consistently on the weekends. She has not actually noticed any improvement in her tremor after consuming alcohol. She is trying to cut back because of recent weight gain. She is typically well rested and well hydrated with water. She currently works from home as she had right foot surgery for bone spur and plantar fasciitis in August 2019. She had left foot surgery in November 2018. She is currently wearing a boot on the right foot. She is currently on gabapentin 200 mg twice a day  for migraine prevention which has worked well for her. She has tried Topamax in the distant past it looks like. She is on generic Paxil 40 mg daily for her depression which has worked well for her. In the past, she was told that she had mini strokes. She was on baby aspirin for years, she recently stopped taking it. I did encourage her to go back on it especially as she did well taking it, no side effects reported. She was also told in the past that she had low vitamin D and has been on an over-the-counter vitamin D supplement which she currently does not use a regular basis. She has not had her vitamin D checked in years as I understand.  Her Past Medical History Is Significant For: Past Medical History:  Diagnosis Date  . Allergy   . Asthma   . GERD (gastroesophageal reflux disease)   . Hyperlipidemia   . Smoking     Her Past Surgical History Is Significant For: Past Surgical History:  Procedure Laterality Date  . FOOT SURGERY Bilateral     Her Family History Is Significant For: Family History  Problem Relation Age of Onset  . Arthritis Mother   . Hyperlipidemia Mother   . Hypertension Mother   . Asthma Father   . Kidney cancer Father   . Kidney disease Father   . Alcohol abuse Maternal Grandmother   . Arthritis Maternal Grandmother   . Heart disease Maternal Grandfather   . Arthritis Paternal Grandmother   . Asthma Paternal Grandmother     Her Social History Is Significant For: Social History   Socioeconomic History  . Marital status: Single    Spouse name: Not on file  . Number of children: Not on file  . Years of education: Not on file  . Highest education level: Not on file  Occupational History  . Not on file  Social Needs  . Financial resource strain: Not on file  . Food insecurity    Worry: Not on file    Inability: Not on file  . Transportation needs    Medical: Not on file    Non-medical: Not on file  Tobacco Use  . Smoking status: Current Every Day  Smoker    Packs/day: 0.50    Types: Cigarettes  . Smokeless tobacco: Never Used  . Tobacco comment: less then 1/2 a pack a day.  prev able to tolerate chantix  Substance and Sexual Activity  . Alcohol use: Yes    Comment: social drinker  . Drug use: No  . Sexual activity: Not on file  Lifestyle  . Physical activity    Days per week: Not on file    Minutes per session: Not on file  . Stress: Not on file  Relationships  . Social Herbalist on phone: Not on file    Gets  together: Not on file    Attends religious service: Not on file    Active member of club or organization: Not on file    Attends meetings of clubs or organizations: Not on file    Relationship status: Not on file  Other Topics Concern  . Not on file  Social History Narrative  . Not on file    Her Allergies Are:  Allergies  Allergen Reactions  . Latex Itching  . Sulfa Antibiotics Rash  :   Her Current Medications Are:  Outpatient Encounter Medications as of 09/10/2018  Medication Sig  . esomeprazole (NEXIUM) 20 MG capsule Take 1 capsule (20 mg total) by mouth 2 (two) times daily before a meal.  . Flaxseed, Linseed, (FLAX SEEDS PO) Take by mouth.  . meloxicam (MOBIC) 15 MG tablet TAKE 1 TABLET BY MOUTH EVERY DAY  . Nutritional Supplements (ESTROVEN PO) Take by mouth.  Marland Kitchen PARoxetine (PAXIL) 40 MG tablet Take 1 tablet (40 mg total) by mouth every morning. MUST SCHEDULE PHYSICAL  . primidone (MYSOLINE) 50 MG tablet TAKE 2 TABLETS BY MOUTH AT  BEDTIME  . propranolol ER (INDERAL Kristie) 160 MG SR capsule Take 1 capsule (160 mg total) by mouth daily.  . valACYclovir (VALTREX) 500 MG tablet Take 1 tablet (500 mg total) by mouth 2 (two) times daily as needed. SCHEDULE PHYSICAL  . varenicline (CHANTIX CONTINUING MONTH PAK) 1 MG tablet Take 1 tablet (1 mg total) by mouth 2 (two) times daily.  . VENTOLIN HFA 108 (90 Base) MCG/ACT inhaler TAKE 2 PUFFS BY MOUTH EVERY 6 HOURS AS NEEDED FOR WHEEZE OR SHORTNESS OF BREATH   . [DISCONTINUED] gabapentin (NEURONTIN) 100 MG capsule TAKE 1 CAPSULE BY MOUTH THREE TIMES A DAY  . [DISCONTINUED] HYDROcodone-acetaminophen (NORCO/VICODIN) 5-325 MG tablet Take 1 tablet by mouth every 6 (six) hours as needed for moderate pain.   Facility-Administered Encounter Medications as of 09/10/2018  Medication  . betamethasone acetate-betamethasone sodium phosphate (CELESTONE) injection 3 mg  :  Review of Systems:  Out of a complete 14 point review of systems, all are reviewed and negative with the exception of these symptoms as listed below: Review of Systems  Neurological:       Here to f/u on Tremor; reports tremor is a little bit better since starting primidone. She would also like to discuss changes in short term memory     Objective:  Neurological Exam  Physical Exam Physical Examination:   Vitals:   09/10/18 0857  BP: 128/80  Pulse: 68  Temp: (!) 97.1 F (36.2 C)  SpO2: 97%    General Examination: The patient is a very pleasant 53 y.o. female in no acute distress. She appears well-developed and well-nourished and well groomed.   HEENT:Normocephalic, atraumatic, pupils are equal, round and reactive to light and accommodation. Extraocular tracking is good without limitation to gaze excursion or nystagmus noted. Normal smooth pursuit is noted. Hearing is grossly intact. Face is symmetric with normal facial animation and normal facial sensation. Speech is clear with no dysarthria noted. There is no hypophonia. Is no obvious voice or lip tremor. She has a very mild head tremor, intermittent. Neck is supple with full range of motion. She has no carotid bruits. Airway examination is benign, but Mild mouth dryness noted. Tongue protrudes centrally and palate elevates symmetrically.  Chest:Clear to auscultation without wheezing, rhonchi or crackles noted.  Heart:S1+S2+0, regular and normal without murmurs, rubs or gallops noted.   Abdomen:Soft, non-tender and  non-distended with normal  bowel sounds appreciated on auscultation.  Extremities:There isnopitting edema in the distal lower extremities bilaterally.   Skin: Warm and dry without trophic changes noted.   Musculoskeletal: exam reveals no obvious joint deformities, tenderness or joint swelling or erythema.   Neurologically:  Mental status: The patient is awake, alert and oriented in all 4 spheres.Herimmediate and remote memory, attention, language skills and fund of knowledge are appropriate. There is no evidence of aphasia, agnosia, apraxia or anomia. Speech is clear with normal prosody and enunciation. Thought process is linear. Mood is normaland affect is normal.  Cranial nerves II - XII are as described above under HEENT exam. Motor exam: Normal bulk, strength and tone is noted. There is no drift,restingtremor or rebound.She has a mild bilateral upper extremity postural and slight action tremor, slightly worse on the left than right, slightly better. She has no lower extremity tremors.  (On9/05/2017: Archimedes spiral drawing she has minimal trembling with both hands. Handwriting with her right hand is legible, not particularly tremulous, not micrographic.)  Romberg is negative. Reflexes are 1+ throughout. Fine motor skills and coordination: intact with normal finger taps, normal hand movements, normal rapid alternating patting, normal foot taps and normal foot agility.  Cerebellar testing: No dysmetria or intention tremor. No ataxia.  Sensory exam: intact to light touch in the upper and lower extremities.  Gait, station and balance:Shestands easily. No veering to one side is noted. No leaning to one side is noted. Posture is age-appropriate and stance is narrow based. Gait shows abnormality, preserved arm swing, tandem walk Is challenging for her.  Assessmentand Miles:  In summary,Autumn Allnutis a very pleasant 58 year oldfemalewith an underlying medical history of  reflux disease, hyperlipidemia, migraine headaches, smoking, and depression, whopresents for follow-up consultation of her essential tremor. She has previously responded to propranolol, she is currently on long-acting propranolol 160 mg daily. We started Primidone at our last appointment in March 2020 and she has responded to it, denies any side effects.  She continues to take her beta-blocker as well.  She is bringing up a different concern today of having short-term memory issues.  We talked about this as well.  She is advised that we can monitor her symptoms, she does not have a family history of dementia thankfully.  Treatable causes are discussed today.  She is encouraged to make an appointment with her primary care nurse practitioner for a full checkup as well as laboratory testing, especially to exclude any obvious treatable causes such as thyroid disease, B12 deficiency, etc.  We talked about the importance of maintaining a healthy lifestyle and smoking cessation is on top of the list of things to do.  She is working on smoking cessation, has a prescription for Chantix.In addition, she is encouraged to make sure she is hydrating well with water, curtailed her alcohol intake and limits herself to up to 1 glass of wine per day if possible.  As far as her tremor, she looks a little improved on my examination and feels that the primidone has helped.  We will maintain the current medication regimen.  She is up-to-date with her prescriptions.  She is advised to follow-up routinely with 1 of our nurse practitioners in 6 months, sooner if needed. I answered all her questions today and she was in agreement.  I spent 25 minutes in total face-to-face time with the patient, more than 50% of which was spent in counseling and coordination of care, reviewing test results, reviewing medication and discussing or reviewing the  diagnosis of ET, short term memory issues, the prognosis and treatment options. Pertinent  laboratory and imaging test results that were available during this visit with the patient were reviewed by me and considered in my medical decision making (see chart for details).

## 2018-09-10 NOTE — Patient Instructions (Addendum)
For your memory concerns: I would recommend a full physical checkup and laboratory testing to include cholesterol profile, thyroid function, vitamin B12, vitamin D, Full chemistry panel, and CBC with differential, to look for any potential treatable causes Of memory issues.  Thankfully, you do not have any family history of dementia.  Work on healthy lifestyle including full smoking cessation and limiting your alcohol to up to 1 glass of wine per day.  Try to continue to hydrate well with water, exercise regularly and get enough sleep, 7 to 8 hours of sleep are recommended.  We can monitor.  For your tremor, we will continue with the primidone at the current dose which is 50 mg strength 2 pills at bedtime.  We do have some room for increasing it, given that you have some issues with your balance from time to time, we should be cautious with any dose increases.

## 2018-09-16 ENCOUNTER — Ambulatory Visit: Payer: Managed Care, Other (non HMO) | Admitting: Neurology

## 2018-09-18 ENCOUNTER — Telehealth: Payer: Self-pay | Admitting: Internal Medicine

## 2018-09-24 ENCOUNTER — Other Ambulatory Visit: Payer: Self-pay | Admitting: Podiatry

## 2018-09-24 MED ORDER — PAROXETINE HCL 40 MG PO TABS: ORAL_TABLET | ORAL | 0 refills | Status: DC

## 2018-09-24 NOTE — Telephone Encounter (Signed)
Rx sent through [facility]-scribe  

## 2018-09-24 NOTE — Telephone Encounter (Signed)
Patient called and scheduled cpx on 10/29/18.  Patient was given a 30 day supply for Paxil.  Patient's insurance won't pay for 30 day supply only a 90 day supply.  Patient wants to know if she can get a rx called in for a 90 day supply.  Patient uses [facility].

## 2018-09-24 NOTE — Addendum Note (Signed)
Addended by: Lurlean Nanny on: 09/24/2018 05:11 PM   Modules accepted: Orders

## 2018-10-21 ENCOUNTER — Ambulatory Visit (INDEPENDENT_AMBULATORY_CARE_PROVIDER_SITE_OTHER): Payer: Managed Care, Other (non HMO) | Admitting: Family Medicine

## 2018-10-21 ENCOUNTER — Encounter: Payer: Self-pay | Admitting: Family Medicine

## 2018-10-21 VITALS — Ht 64.5 in | Wt 143.0 lb

## 2018-10-21 DIAGNOSIS — J029 Acute pharyngitis, unspecified: Secondary | ICD-10-CM | POA: Insufficient documentation

## 2018-10-21 MED ORDER — AMOXICILLIN 500 MG PO CAPS: 500.0000 mg | ORAL_CAPSULE | Freq: Three times a day (TID) | ORAL | 0 refills | Status: DC

## 2018-10-21 NOTE — Assessment & Plan Note (Signed)
Anticipate most likely viral pharyngitis, less likely strep pharyngitis. Supportive care reviewed - continue ibuprofen for symptoms. WASP for amox 500mg  tid sent to pharmacy with indications when to fill to cover strep pharynghtisi.  Discussed option of [phone] testing, she decides to defer for now. Reasons to seek covid test reviewed. She would likely order through work if needed (works for [employer]).

## 2018-10-21 NOTE — Progress Notes (Signed)
Virtual visit completed through [employer]. Due to national recommendations of social distancing due to COVID-19, a virtual visit is felt to be most appropriate for this patient at this time. Reviewed limitations of a virtual visit.   Patient location: home Provider location: [place] at [facility], office If any vitals were documented, they were collected by patient at home unless specified below.    Ht 5' 4.5" (1.638 m)   Wt 143 lb (64.9 kg)   BMI 24.17 kg/m    CC: ST Subjective:    Patient ID: Kristie Miles, female    DOB: 1965/06/18, 53 y.o.   MRN: [id number]  HPI: Kristie Miles is a 53 y.o. female presenting on 10/21/2018 for Sore Throat (C/o sore throat, swollen glands, nasal congestion and fatigue.  Denies any fever, cough or SOB.  Sxs started 10/19/18.  )   2d h/o ST, swollen R cervical lymph node, nasal congestion, fatigue/malaise. Possible low grade fever but hasn't checked temp. Today feels worse than yesterday. ST worse in the mornings. Hasn't noticed any white spots in throat.   Treating with ibuprofen 800mg  BID with benefit.   No chills, ear or tooth pain, cough, nausea, diarrhea, abd pain, HA, PNDrainage.   No sick contacts at home. 5 ppl living at home - 3 teenage girls.  Smoking 1/2 ppd. She is taking chantix and trying to quit.  She does have mild asthma, on albuterol inh PRN. Has not needed albuterol inhaler recently.      Relevant past medical, surgical, family and social history reviewed and updated as indicated. Interim medical history since our last visit reviewed. Allergies and medications reviewed and updated. Outpatient Medications Prior to Visit  Medication Sig Dispense Refill  . esomeprazole (NEXIUM) 20 MG capsule Take 1 capsule (20 mg total) by mouth 2 (two) times daily before a meal. 180 capsule 1  . Flaxseed, Linseed, (FLAX SEEDS PO) Take by mouth.    . meloxicam (MOBIC) 15 MG tablet TAKE 1 TABLET BY MOUTH EVERY DAY 30 tablet 3  . Nutritional  Supplements (ESTROVEN PO) Take by mouth.    Marland Kitchen PARoxetine (PAXIL) 40 MG tablet TAKE 1 TABLET(40 MG) BY MOUTH EVERY MORNING 90 tablet 0  . primidone (MYSOLINE) 50 MG tablet TAKE 2 TABLETS BY MOUTH AT  BEDTIME 180 tablet 3  . propranolol ER (INDERAL LA) 160 MG SR capsule Take 1 capsule (160 mg total) by mouth daily. 90 capsule 3  . valACYclovir (VALTREX) 500 MG tablet Take 1 tablet (500 mg total) by mouth 2 (two) times daily as needed. SCHEDULE PHYSICAL 90 tablet 0  . varenicline (CHANTIX CONTINUING MONTH PAK) 1 MG tablet Take 1 tablet (1 mg total) by mouth 2 (two) times daily. 60 tablet 0  . VENTOLIN HFA 108 (90 Base) MCG/ACT inhaler TAKE 2 PUFFS BY MOUTH EVERY 6 HOURS AS NEEDED FOR WHEEZE OR SHORTNESS OF BREATH 18 Inhaler 0   Facility-Administered Medications Prior to Visit  Medication Dose Route Frequency Provider Last Rate Last Dose  . betamethasone acetate-betamethasone sodium phosphate (CELESTONE) injection 3 mg  3 mg Intramuscular Once Edrick Kins, DPM         Per HPI unless specifically indicated in ROS section below Review of Systems Objective:    Ht 5' 4.5" (1.638 m)   Wt 143 lb (64.9 kg)   BMI 24.17 kg/m   Wt Readings from Last 3 Encounters:  10/21/18 143 lb (64.9 kg)  09/10/18 143 lb (64.9 kg)  03/14/18 149 lb (67.6 kg)  Physical exam: Gen: alert, NAD, not ill appearing HEENT: unable to evaluate oropharynx Pulm: speaks in complete sentences without increased work of breathing Psych: normal mood, normal thought content      No results found for this or any previous visit. Assessment & Plan:   Problem List Items Addressed This Visit    Sore throat - Primary    Anticipate most likely viral pharyngitis, less likely strep pharyngitis. Supportive care reviewed - continue ibuprofen for symptoms. WASP for amox 500mg  tid sent to pharmacy with indications when to fill to cover strep pharynghtisi.  Discussed option of [phone] testing, she decides to defer for now. Reasons  to seek covid test reviewed. She would likely order through work if needed (works for [employer]).           Meds ordered this encounter  Medications  . amoxicillin (AMOXIL) 500 MG capsule    Sig: Take 1 capsule (500 mg total) by mouth 3 (three) times daily.    Dispense:  21 capsule    Refill:  0   No orders of the defined types were placed in this encounter.   I discussed the assessment and treatment plan with the patient. The patient was provided an opportunity to ask questions and all were answered. The patient agreed with the plan and demonstrated an understanding of the instructions. The patient was advised to call back or seek an in-person evaluation if the symptoms worsen or if the condition fails to improve as anticipated.  Follow up plan: No follow-ups on file.  Ria Bush, MD

## 2018-10-29 ENCOUNTER — Encounter: Payer: Managed Care, Other (non HMO) | Admitting: Internal Medicine

## 2018-11-21 ENCOUNTER — Ambulatory Visit: Payer: Managed Care, Other (non HMO)

## 2018-12-10 ENCOUNTER — Encounter: Payer: Managed Care, Other (non HMO) | Admitting: Internal Medicine

## 2018-12-10 NOTE — Progress Notes (Deleted)
Subjective:    Patient ID: Kristie Miles, female    DOB: 13-Nov-1965, 53 y.o.   MRN: [id number]  HPI  Pt presents to the clinic today for her annual exam. She is also due to follow up on chronic conditions.  Asthma: Mild, intermittent. She takes Albuterol prn. There is no PFT's on file.  GERD: Triggered by. She denies breakthrough on Esomeprazole. There is no upper GI on file.  Frequent Headaches: These occur. She takes Propanolol as needed with good relief of symptoms. She follows with neurology.  Essential Tremor: Managed on Primidone and Propanolol. She follows with neurology.  HLD with Hx of TIA: There is no LDL on file. She denies residual effect. She is not taking any anticoagulation at this time.  Mood Disorder: Mainly. Managed on Paroxetine. She is not currently seeing a therapist. She denies SI/HI.  Flu: 09/2017 Tetanus: Pap Smear: Mammogram: Colon Screening: Vision Screening: Dentist:  Diet: Exercise:  Review of Systems      Past Medical History:  Diagnosis Date  . Allergy   . Asthma   . GERD (gastroesophageal reflux disease)   . Hyperlipidemia   . Smoking     Current Outpatient Medications  Medication Sig Dispense Refill  . amoxicillin (AMOXIL) 500 MG capsule Take 1 capsule (500 mg total) by mouth 3 (three) times daily. 21 capsule 0  . esomeprazole (NEXIUM) 20 MG capsule Take 1 capsule (20 mg total) by mouth 2 (two) times daily before a meal. 180 capsule 1  . Flaxseed, Linseed, (FLAX SEEDS PO) Take by mouth.    . meloxicam (MOBIC) 15 MG tablet TAKE 1 TABLET BY MOUTH EVERY DAY 30 tablet 3  . Nutritional Supplements (ESTROVEN PO) Take by mouth.    Marland Kitchen PARoxetine (PAXIL) 40 MG tablet TAKE 1 TABLET(40 MG) BY MOUTH EVERY MORNING 90 tablet 0  . primidone (MYSOLINE) 50 MG tablet TAKE 2 TABLETS BY MOUTH AT  BEDTIME 180 tablet 3  . propranolol ER (INDERAL LA) 160 MG SR capsule Take 1 capsule (160 mg total) by mouth daily. 90 capsule 3  . valACYclovir (VALTREX) 500  MG tablet Take 1 tablet (500 mg total) by mouth 2 (two) times daily as needed. SCHEDULE PHYSICAL 90 tablet 0  . varenicline (CHANTIX CONTINUING MONTH PAK) 1 MG tablet Take 1 tablet (1 mg total) by mouth 2 (two) times daily. 60 tablet 0  . VENTOLIN HFA 108 (90 Base) MCG/ACT inhaler TAKE 2 PUFFS BY MOUTH EVERY 6 HOURS AS NEEDED FOR WHEEZE OR SHORTNESS OF BREATH 18 Inhaler 0   Current Facility-Administered Medications  Medication Dose Route Frequency Provider Last Rate Last Dose  . betamethasone acetate-betamethasone sodium phosphate (CELESTONE) injection 3 mg  3 mg Intramuscular Once Edrick Kins, DPM        Allergies  Allergen Reactions  . Latex Itching  . Sulfa Antibiotics Rash    Family History  Problem Relation Age of Onset  . Arthritis Mother   . Hyperlipidemia Mother   . Hypertension Mother   . Asthma Father   . Kidney cancer Father   . Kidney disease Father   . Alcohol abuse Maternal Grandmother   . Arthritis Maternal Grandmother   . Heart disease Maternal Grandfather   . Arthritis Paternal Grandmother   . Asthma Paternal Grandmother     Social History   Socioeconomic History  . Marital status: Single    Spouse name: Not on file  . Number of children: Not on file  . Years of education:  Not on file  . Highest education level: Not on file  Occupational History  . Not on file  Social Needs  . Financial resource strain: Not on file  . Food insecurity    Worry: Not on file    Inability: Not on file  . Transportation needs    Medical: Not on file    Non-medical: Not on file  Tobacco Use  . Smoking status: Current Every Day Smoker    Packs/day: 0.50    Types: Cigarettes  . Smokeless tobacco: Never Used  . Tobacco comment: less then 1/2 a pack a day.  prev able to tolerate chantix  Substance and Sexual Activity  . Alcohol use: Yes    Comment: social drinker  . Drug use: No  . Sexual activity: Not on file  Lifestyle  . Physical activity    Days per week: Not  on file    Minutes per session: Not on file  . Stress: Not on file  Relationships  . Social Herbalist on phone: Not on file    Gets together: Not on file    Attends religious service: Not on file    Active member of club or organization: Not on file    Attends meetings of clubs or organizations: Not on file    Relationship status: Not on file  . Intimate partner violence    Fear of current or ex partner: Not on file    Emotionally abused: Not on file    Physically abused: Not on file    Forced sexual activity: Not on file  Other Topics Concern  . Not on file  Social History Narrative  . Not on file     Constitutional: Denies fever, malaise, fatigue, headache or abrupt weight changes.  HEENT: Denies eye pain, eye redness, ear pain, ringing in the ears, wax buildup, runny nose, nasal congestion, bloody nose, or sore throat. Respiratory: Denies difficulty breathing, shortness of breath, cough or sputum production.   Cardiovascular: Denies chest pain, chest tightness, palpitations or swelling in the hands or feet.  Gastrointestinal: Denies abdominal pain, bloating, constipation, diarrhea or blood in the stool.  GU: Denies urgency, frequency, pain with urination, burning sensation, blood in urine, odor or discharge. Musculoskeletal: Denies decrease in range of motion, difficulty with gait, muscle pain or joint pain and swelling.  Skin: Denies redness, rashes, lesions or ulcercations.  Neurological: Denies dizziness, difficulty with memory, difficulty with speech or problems with balance and coordination.  Psych: Denies anxiety, depression, SI/HI.  No other specific complaints in a complete review of systems (except as listed in HPI above).  Objective:   Physical Exam    There were no vitals taken for this visit. Wt Readings from Last 3 Encounters:  10/21/18 143 lb (64.9 kg)  09/10/18 143 lb (64.9 kg)  03/14/18 149 lb (67.6 kg)    General: Appears their stated  age, well developed, well nourished in NAD. Skin: Warm, dry and intact. No rashes, lesions or ulcerations noted. HEENT: Head: normal shape and size; Eyes: sclera white, no icterus, conjunctiva pink, PERRLA and EOMs intact; Ears: Tm's gray and intact, normal light reflex; Nose: mucosa pink and moist, septum midline; Throat/Mouth: Teeth present, mucosa pink and moist, no exudate, lesions or ulcerations noted.  Neck:  Neck supple, trachea midline. No masses, lumps or thyromegaly present.  Cardiovascular: Normal rate and rhythm. S1,S2 noted.  No murmur, rubs or gallops noted. No JVD or BLE edema. No carotid bruits noted.  Pulmonary/Chest: Normal effort and positive vesicular breath sounds. No respiratory distress. No wheezes, rales or ronchi noted.  Abdomen: Soft and nontender. Normal bowel sounds. No distention or masses noted. Liver, spleen and kidneys non palpable. Musculoskeletal: Normal range of motion. No signs of joint swelling. No difficulty with gait.  Neurological: Alert and oriented. Cranial nerves II-XII grossly intact. Coordination normal.  Psychiatric: Mood and affect normal. Behavior is normal. Judgment and thought content normal.   EKG:  BMET No results found for: NA, K, CL, CO2, GLUCOSE, BUN, CREATININE, CALCIUM, GFRNONAA, GFRAA  Lipid Panel  No results found for: CHOL, TRIG, HDL, CHOLHDL, VLDL, LDLCALC  CBC No results found for: WBC, RBC, HGB, HCT, PLT, MCV, MCH, MCHC, RDW, LYMPHSABS, MONOABS, EOSABS, BASOSABS  Hgb A1C No results found for: HGBA1C        Assessment & Plan:

## 2018-12-16 ENCOUNTER — Other Ambulatory Visit: Payer: Self-pay

## 2018-12-16 ENCOUNTER — Encounter: Payer: Self-pay | Admitting: Internal Medicine

## 2018-12-16 ENCOUNTER — Ambulatory Visit: Payer: Managed Care, Other (non HMO) | Admitting: Internal Medicine

## 2018-12-16 VITALS — BP 120/82 | HR 87 | Temp 97.9°F | Wt 143.0 lb

## 2018-12-16 DIAGNOSIS — S46811A Strain of other muscles, fascia and tendons at shoulder and upper arm level, right arm, initial encounter: Secondary | ICD-10-CM

## 2018-12-16 MED ORDER — CYCLOBENZAPRINE HCL 5 MG PO TABS: 5.0000 mg | ORAL_TABLET | Freq: Three times a day (TID) | ORAL | 0 refills | Status: DC | PRN

## 2018-12-16 MED ORDER — METHYLPREDNISOLONE ACETATE 80 MG/ML IJ SUSP
80.0000 mg | Freq: Once | INTRAMUSCULAR | Status: AC
  Administered 2018-12-16: 80 mg via INTRAMUSCULAR

## 2018-12-16 MED ORDER — ESOMEPRAZOLE MAGNESIUM 20 MG PO CPDR: 20.0000 mg | DELAYED_RELEASE_CAPSULE | Freq: Two times a day (BID) | ORAL | 1 refills | Status: DC

## 2018-12-16 NOTE — Progress Notes (Signed)
Subjective:    Patient ID: Kristie Miles, female    DOB: March 25, 1965, 53 y.o.   MRN: [id number]  HPI  Pt presents to the clinic today with c/o neck and shoulder pain. This started 2 days ago. She describes the pain as "pinching" and soreness. The pain does not radiate. She denies numbness, tingling or weakness of the right upper extremity. She denies any injury to the area. She has tried Ibuprofen, Meloxciam and Icy Hot without any relief.  She would like a refill of her Esomeprazole today.  Review of Systems      Past Medical History:  Diagnosis Date  . Allergy   . Asthma   . GERD (gastroesophageal reflux disease)   . Hyperlipidemia   . Smoking     Current Outpatient Medications  Medication Sig Dispense Refill  . amoxicillin (AMOXIL) 500 MG capsule Take 1 capsule (500 mg total) by mouth 3 (three) times daily. 21 capsule 0  . esomeprazole (NEXIUM) 20 MG capsule Take 1 capsule (20 mg total) by mouth 2 (two) times daily before a meal. 180 capsule 1  . Flaxseed, Linseed, (FLAX SEEDS PO) Take by mouth.    . meloxicam (MOBIC) 15 MG tablet TAKE 1 TABLET BY MOUTH EVERY DAY 30 tablet 3  . Nutritional Supplements (ESTROVEN PO) Take by mouth.    Marland Kitchen PARoxetine (PAXIL) 40 MG tablet TAKE 1 TABLET(40 MG) BY MOUTH EVERY MORNING 90 tablet 0  . primidone (MYSOLINE) 50 MG tablet TAKE 2 TABLETS BY MOUTH AT  BEDTIME 180 tablet 3  . propranolol ER (INDERAL LA) 160 MG SR capsule Take 1 capsule (160 mg total) by mouth daily. 90 capsule 3  . valACYclovir (VALTREX) 500 MG tablet Take 1 tablet (500 mg total) by mouth 2 (two) times daily as needed. SCHEDULE PHYSICAL 90 tablet 0  . varenicline (CHANTIX CONTINUING MONTH PAK) 1 MG tablet Take 1 tablet (1 mg total) by mouth 2 (two) times daily. 60 tablet 0  . VENTOLIN HFA 108 (90 Base) MCG/ACT inhaler TAKE 2 PUFFS BY MOUTH EVERY 6 HOURS AS NEEDED FOR WHEEZE OR SHORTNESS OF BREATH 18 Inhaler 0   Current Facility-Administered Medications  Medication Dose Route  Frequency Provider Last Rate Last Admin  . betamethasone acetate-betamethasone sodium phosphate (CELESTONE) injection 3 mg  3 mg Intramuscular Once Edrick Kins, DPM        Allergies  Allergen Reactions  . Latex Itching  . Sulfa Antibiotics Rash    Family History  Problem Relation Age of Onset  . Arthritis Mother   . Hyperlipidemia Mother   . Hypertension Mother   . Asthma Father   . Kidney cancer Father   . Kidney disease Father   . Alcohol abuse Maternal Grandmother   . Arthritis Maternal Grandmother   . Heart disease Maternal Grandfather   . Arthritis Paternal Grandmother   . Asthma Paternal Grandmother     Social History   Socioeconomic History  . Marital status: Single    Spouse name: Not on file  . Number of children: Not on file  . Years of education: Not on file  . Highest education level: Not on file  Occupational History  . Not on file  Tobacco Use  . Smoking status: Current Every Day Smoker    Packs/day: 0.50    Types: Cigarettes  . Smokeless tobacco: Never Used  . Tobacco comment: less then 1/2 a pack a day.  prev able to tolerate chantix  Substance and Sexual Activity  .  Alcohol use: Yes    Comment: social drinker  . Drug use: No  . Sexual activity: Not on file  Other Topics Concern  . Not on file  Social History Narrative  . Not on file   Social Determinants of Health   Financial Resource Strain:   . Difficulty of Paying Living Expenses: Not on file  Food Insecurity:   . Worried About Charity fundraiser in the Last Year: Not on file  . Ran Out of Food in the Last Year: Not on file  Transportation Needs:   . Lack of Transportation (Medical): Not on file  . Lack of Transportation (Non-Medical): Not on file  Physical Activity:   . Days of Exercise per Week: Not on file  . Minutes of Exercise per Session: Not on file  Stress:   . Feeling of Stress : Not on file  Social Connections:   . Frequency of Communication with Friends and Family:  Not on file  . Frequency of Social Gatherings with Friends and Family: Not on file  . Attends Religious Services: Not on file  . Active Member of Clubs or Organizations: Not on file  . Attends Archivist Meetings: Not on file  . Marital Status: Not on file  Intimate Partner Violence:   . Fear of Current or Ex-Partner: Not on file  . Emotionally Abused: Not on file  . Physically Abused: Not on file  . Sexually Abused: Not on file     Constitutional: Denies fever, malaise, fatigue, headache or abrupt weight changes.  Respiratory: Denies difficulty breathing, shortness of breath, cough or sputum production.   Cardiovascular: Denies chest pain, chest tightness, palpitations or swelling in the hands or feet.  Gastrointestinal: Denies abdominal pain, bloating, constipation, diarrhea or blood in the stool.  Musculoskeletal: Pt reports neck and shoulder pain. Denies decrease in range of motion, difficulty with gait, or joint swelling.  Skin: Denies redness, rashes, lesions or ulcercations.   No other specific complaints in a complete review of systems (except as listed in HPI above).  Objective:   Physical Exam   BP 120/82   Pulse 87   Temp 97.9 F (36.6 C) (Temporal)   Wt 143 lb (64.9 kg)   SpO2 98%   BMI 24.17 kg/m   Wt Readings from Last 3 Encounters:  10/21/18 143 lb (64.9 kg)  09/10/18 143 lb (64.9 kg)  03/14/18 149 lb (67.6 kg)    General: Appears er stated age, well developed, well nourished in NAD. Skin: Warm, dry and intact. No rashes noted. Cardiovascular: Normal rate and rhythm. S1,S2 noted.  No murmur, rubs or gallops noted.  Pulmonary/Chest: Normal effort and positive vesicular breath sounds. No respiratory distress. No wheezes, rales or ronchi noted.  Musculoskeletal: Normal flexion and extension of the cervical spine. Pain with rotation of the cervical spine, R>L. No bony tenderness noted over the cervical spine. Normal internal and external rotation of  the right shoulder. Pain with palpation of the trapezius. Strength 5/5 BUE. Hand grips equal. Neurological: Alert and oriented.      Assessment & Plan:   Trapezius Muscle Strain:  80 mg Depo IM today RX for Flexeril 5 mg TID prn- sedation caution given Encouraged heat and Massage Continue Ibuprofen OTC If persists or worsens, consider xray cervical spine, right shoulder  Webb Silversmith, NP This visit occurred during the SARS-CoV-2 public health emergency.  Safety protocols were in place, including screening questions prior to the visit, additional usage of staff  PPE, and extensive cleaning of exam room while observing appropriate contact time as indicated for disinfecting solutions.

## 2018-12-16 NOTE — Addendum Note (Signed)
Addended by: Lurlean Nanny on: 12/16/2018 04:49 PM   Modules accepted: Orders

## 2018-12-16 NOTE — Patient Instructions (Signed)
Muscle Strain A muscle strain is an injury that happens when a muscle is stretched longer than normal. This can happen during a fall, sports, or lifting. This can tear some muscle fibers. Usually, recovery from muscle strain takes 1-2 weeks. Complete healing normally takes 5-6 weeks. This condition is first treated with PRICE therapy. This involves:  Protecting your muscle from being injured again.  Resting your injured muscle.  Icing your injured muscle.  Applying pressure (compression) to your injured muscle. This may be done with a splint or elastic bandage.  Raising (elevating) your injured muscle. Your doctor may also recommend medicine for pain. Follow these instructions at home: If you have a splint:  Wear the splint as told by your doctor. Take it off only as told by your doctor.  Loosen the splint if your fingers or toes tingle, get numb, or turn cold and blue.  Keep the splint clean.  If the splint is not waterproof: ? Do not let it get wet. ? Cover it with a watertight covering when you take a bath or a shower. Managing pain, stiffness, and swelling   If directed, put ice on your injured area. ? If you have a removable splint, take it off as told by your doctor. ? Put ice in a plastic bag. ? Place a towel between your skin and the bag. ? Leave the ice on for 20 minutes, 2-3 times a day.  Move your fingers or toes often. This helps to avoid stiffness and lessen swelling.  Raise your injured area above the level of your heart while you are sitting or lying down.  Wear an elastic bandage as told by your doctor. Make sure it is not too tight. General instructions  Take over-the-counter and prescription medicines only as told by your doctor.  Limit your activity. Rest your injured muscle as told by your doctor. Your doctor may say that gentle movements are okay.  If physical therapy was prescribed, do exercises as told by your doctor.  Do not put pressure on any  part of the splint until it is fully hardened. This may take many hours.  Do not use any products that contain nicotine or tobacco, such as cigarettes and e-cigarettes. These can delay bone healing. If you need help quitting, ask your doctor.  Warm up before you exercise. This helps to prevent more muscle strains.  Ask your doctor when it is safe to drive if you have a splint.  Keep all follow-up visits as told by your doctor. This is important. Contact a doctor if:  You have more pain or swelling in your injured area. Get help right away if:  You have any of these problems in your injured area: ? You have numbness. ? You have tingling. ? You lose a lot of strength. Summary  A muscle strain is an injury that happens when a muscle is stretched longer than normal.  This condition is first treated with PRICE therapy. This includes protecting, resting, icing, adding pressure, and raising your injury.  Limit your activity. Rest your injured muscle as told by your doctor. Your doctor may say that gentle movements are okay.  Warm up before you exercise. This helps to prevent more muscle strains. This information is not intended to replace advice given to you by your health care provider. Make sure you discuss any questions you have with your health care provider. Document Released: 09/28/2007 Document Revised: 02/14/2018 Document Reviewed: 01/26/2016 Elsevier Patient Education  2020 [employer]  

## 2018-12-18 ENCOUNTER — Ambulatory Visit: Payer: Managed Care, Other (non HMO)

## 2018-12-19 ENCOUNTER — Telehealth: Payer: Self-pay | Admitting: Internal Medicine

## 2018-12-19 MED ORDER — IBUPROFEN 800 MG PO TABS: 800.0000 mg | ORAL_TABLET | Freq: Three times a day (TID) | ORAL | 0 refills | Status: DC | PRN

## 2018-12-19 NOTE — Telephone Encounter (Signed)
Patient stated that she was in 12/14 for shoulder/neck pain  She would like to know if something could be called in for pain .  Patient did received a shot but it has wore off/   She stated that she feels like she is back to where she was before  Patient has tried a muscle relaxer and it seems to take the edge off    [facility]- [address]- [employer]

## 2018-12-19 NOTE — Addendum Note (Signed)
Addended by: Lurlean Nanny on: 12/19/2018 04:39 PM   Modules accepted: Orders

## 2018-12-19 NOTE — Telephone Encounter (Signed)
I will send in Ibuprofen for inflammation and pain. Continue Flexeril. Encourage heat and massage.

## 2018-12-19 NOTE — Telephone Encounter (Signed)
Pt is aware as instructed and understood...  Pt states her insurance would not cover Nexium and she was taking 40mg  QD, can you send in alternative, other Rx d/c from med list

## 2018-12-19 NOTE — Addendum Note (Signed)
Addended by: Jearld Fenton on: 12/19/2018 10:17 AM   Modules accepted: Orders

## 2018-12-20 MED ORDER — PANTOPRAZOLE SODIUM 40 MG PO TBEC: 40.0000 mg | DELAYED_RELEASE_TABLET | Freq: Every day | ORAL | 3 refills | Status: DC

## 2018-12-20 NOTE — Addendum Note (Signed)
Addended by: Jearld Fenton on: 12/20/2018 03:11 PM   Modules accepted: Orders

## 2018-12-20 NOTE — Telephone Encounter (Signed)
Sent in Pantoprazole.

## 2018-12-23 ENCOUNTER — Other Ambulatory Visit: Payer: Self-pay | Admitting: Internal Medicine

## 2019-01-08 ENCOUNTER — Other Ambulatory Visit: Payer: Self-pay | Admitting: Podiatry

## 2019-01-10 ENCOUNTER — Other Ambulatory Visit: Payer: Self-pay | Admitting: Neurology

## 2019-01-21 ENCOUNTER — Other Ambulatory Visit: Payer: Self-pay

## 2019-01-21 ENCOUNTER — Encounter: Payer: Self-pay | Admitting: Internal Medicine

## 2019-01-21 ENCOUNTER — Ambulatory Visit (INDEPENDENT_AMBULATORY_CARE_PROVIDER_SITE_OTHER): Payer: Managed Care, Other (non HMO) | Admitting: Internal Medicine

## 2019-01-21 VITALS — BP 124/80 | HR 66 | Temp 98.0°F | Ht 64.0 in | Wt 140.0 lb

## 2019-01-21 DIAGNOSIS — J4520 Mild intermittent asthma, uncomplicated: Secondary | ICD-10-CM

## 2019-01-21 DIAGNOSIS — K219 Gastro-esophageal reflux disease without esophagitis: Secondary | ICD-10-CM | POA: Diagnosis not present

## 2019-01-21 DIAGNOSIS — G250 Essential tremor: Secondary | ICD-10-CM

## 2019-01-21 DIAGNOSIS — B009 Herpesviral infection, unspecified: Secondary | ICD-10-CM

## 2019-01-21 DIAGNOSIS — F39 Unspecified mood [affective] disorder: Secondary | ICD-10-CM

## 2019-01-21 DIAGNOSIS — Z8673 Personal history of transient ischemic attack (TIA), and cerebral infarction without residual deficits: Secondary | ICD-10-CM

## 2019-01-21 DIAGNOSIS — R519 Headache, unspecified: Secondary | ICD-10-CM | POA: Diagnosis not present

## 2019-01-21 DIAGNOSIS — Z0000 Encounter for general adult medical examination without abnormal findings: Secondary | ICD-10-CM | POA: Diagnosis not present

## 2019-01-21 DIAGNOSIS — Z1211 Encounter for screening for malignant neoplasm of colon: Secondary | ICD-10-CM

## 2019-01-21 DIAGNOSIS — E7800 Pure hypercholesterolemia, unspecified: Secondary | ICD-10-CM

## 2019-01-21 LAB — CBC
HCT: 40 % (ref 36.0–46.0)
Hemoglobin: 13.3 g/dL (ref 12.0–15.0)
MCHC: 33.3 g/dL (ref 30.0–36.0)
MCV: 97.5 fl (ref 78.0–100.0)
Platelets: 304 10*3/uL (ref 150.0–400.0)
RBC: 4.1 Mil/uL (ref 3.87–5.11)
RDW: 13.4 % (ref 11.5–15.5)
WBC: 5.2 10*3/uL (ref 4.0–10.5)

## 2019-01-21 LAB — LDL CHOLESTEROL, DIRECT: Direct LDL: 196 mg/dL

## 2019-01-21 LAB — COMPREHENSIVE METABOLIC PANEL
ALT: 22 U/L (ref 0–35)
AST: 14 U/L (ref 0–37)
Albumin: 4.4 g/dL (ref 3.5–5.2)
Alkaline Phosphatase: 78 U/L (ref 39–117)
BUN: 19 mg/dL (ref 6–23)
CO2: 30 mEq/L (ref 19–32)
Calcium: 9.6 mg/dL (ref 8.4–10.5)
Chloride: 102 mEq/L (ref 96–112)
Creatinine, Ser: 0.68 mg/dL (ref 0.40–1.20)
GFR: 89.95 mL/min (ref >60.00)
Glucose, Bld: 111 mg/dL — ABNORMAL HIGH (ref 70–99)
Potassium: 3.8 mEq/L (ref 3.5–5.1)
Sodium: 137 mEq/L (ref 135–145)
Total Bilirubin: 0.4 mg/dL (ref 0.2–1.2)
Total Protein: 6.9 g/dL (ref 6.0–8.3)

## 2019-01-21 LAB — LIPID PANEL
Cholesterol: 291 mg/dL — ABNORMAL HIGH (ref 0–200)
HDL: 53.6 mg/dL (ref >39.00)
NonHDL: 237.66
Total CHOL/HDL Ratio: 5
Triglycerides: 329 mg/dL — ABNORMAL HIGH (ref 0.0–149.0)
VLDL: 65.8 mg/dL — ABNORMAL HIGH (ref 0.0–40.0)

## 2019-01-21 LAB — VITAMIN D 25 HYDROXY (VIT D DEFICIENCY, FRACTURES): VITD: 21.91 ng/mL — ABNORMAL LOW (ref 30.00–100.00)

## 2019-01-21 MED ORDER — BUTALBITAL-APAP-CAFFEINE 50-325-40 MG PO TABS: 1.0000 | ORAL_TABLET | Freq: Four times a day (QID) | ORAL | 0 refills | Status: DC | PRN

## 2019-01-21 MED ORDER — TRIAMCINOLONE ACETONIDE 0.1 % EX CREA: 1.0000 "application " | TOPICAL_CREAM | Freq: Two times a day (BID) | CUTANEOUS | 1 refills | Status: DC

## 2019-01-21 NOTE — Assessment & Plan Note (Signed)
Continue Primidone and Propanolol

## 2019-01-21 NOTE — Assessment & Plan Note (Signed)
No residual effect Continue ASA Will continue to follow with neurology

## 2019-01-21 NOTE — Assessment & Plan Note (Addendum)
Continue Propanolol  Will give RX for Fioricet for breakthrough She will continue to follow with neurology

## 2019-01-21 NOTE — Assessment & Plan Note (Signed)
Continue Albuterol as needed Will monitor

## 2019-01-21 NOTE — Assessment & Plan Note (Addendum)
CMET and Lipid profile today Encouraged her to consume a low fat diet Will restart Simvastatin if LDL > 130

## 2019-01-21 NOTE — Patient Instructions (Signed)

## 2019-01-21 NOTE — Assessment & Plan Note (Addendum)
Continue Pantoprazole CBC and CMET today 

## 2019-01-21 NOTE — Progress Notes (Signed)
Subjective:    Patient ID: Kristie Miles, female    DOB: 1965/03/24, 54 y.o.   MRN: [id number]  HPI  Pt presents to the clinic today for her annual exam. She is also due to follow up chronic conditions.  Asthma: Mild, intermittent. She uses Albuterol as needed only. There are no PFT's on file.   GERD: Triggered by everything she eats. She is taking Pantoprazole as prescribed with relief. There is no upper GI on file.   Hx of Cold Sores: She reports a flare this month due to stress. She takes Valtrex as needed for outbreaks.   Frequent Headaches:  Her migraines have increased to once a week and include visual auras, photophobia, and phonophobia. This is relieved with sleep. She has tried Ibuprofen and Tylenol with no relief. She takes Propanolol as prescribed. She follows with neurology.  Mood Disorder: Mostly anxiety due to increased work stress, managed on Paroxetine. She denies depression, SI/HI.  HLD s/p TIA: No residual effect. She is not taking Simvastatin as prescribed. She is taking a baby ASA daily. She tries to consume a low fat diet.   Essential Tremor: Managed on PrimidonePropanolol. She follows with neurology.  Flu: 12/2018 Tetanus: 2010 Pap Smear: 10/2017 Mammogram: 11/2017 Colon Screening: never Vision Screening: annually Dentist: biannually  Diet: Eats meat, fruits and veggies. Avoids fried food. Drinks water, green tea, and milk. Some wine, 4 total per week. Exercise: No formal exercise right now.   Review of Systems      Past Medical History:  Diagnosis Date  . Allergy   . Asthma   . GERD (gastroesophageal reflux disease)   . Hyperlipidemia   . Smoking     Current Outpatient Medications  Medication Sig Dispense Refill  . cyclobenzaprine (FLEXERIL) 5 MG tablet Take 1 tablet (5 mg total) by mouth 3 (three) times daily as needed for muscle spasms. 20 tablet 0  . Flaxseed, Linseed, (FLAX SEEDS PO) Take by mouth.    Marland Kitchen ibuprofen (ADVIL) 800 MG tablet Take  1 tablet (800 mg total) by mouth every 8 (eight) hours as needed. 30 tablet 0  . meloxicam (MOBIC) 15 MG tablet TAKE 1 TABLET BY MOUTH EVERY DAY 30 tablet 3  . Nutritional Supplements (ESTROVEN PO) Take by mouth.    . pantoprazole (PROTONIX) 40 MG tablet Take 1 tablet (40 mg total) by mouth daily. 90 tablet 3  . PARoxetine (PAXIL) 40 MG tablet TAKE 1 TABLET(40 MG) BY MOUTH EVERY MORNING 90 tablet 0  . primidone (MYSOLINE) 50 MG tablet TAKE 2 TABLETS BY MOUTH AT  BEDTIME 180 tablet 3  . propranolol ER (INDERAL LA) 160 MG SR capsule Take 1 capsule (160 mg total) by mouth daily. 90 capsule 3  . valACYclovir (VALTREX) 500 MG tablet Take 1 tablet (500 mg total) by mouth 2 (two) times daily as needed. SCHEDULE PHYSICAL 90 tablet 0  . VENTOLIN HFA 108 (90 Base) MCG/ACT inhaler TAKE 2 PUFFS BY MOUTH EVERY 6 HOURS AS NEEDED FOR WHEEZE OR SHORTNESS OF BREATH 18 Inhaler 0   Current Facility-Administered Medications  Medication Dose Route Frequency Provider Last Rate Last Admin  . betamethasone acetate-betamethasone sodium phosphate (CELESTONE) injection 3 mg  3 mg Intramuscular Once Edrick Kins, DPM        Allergies  Allergen Reactions  . Latex Itching  . Sulfa Antibiotics Rash    Family History  Problem Relation Age of Onset  . Arthritis Mother   . Hyperlipidemia Mother   .  Hypertension Mother   . Asthma Father   . Kidney cancer Father   . Kidney disease Father   . Alcohol abuse Maternal Grandmother   . Arthritis Maternal Grandmother   . Heart disease Maternal Grandfather   . Arthritis Paternal Grandmother   . Asthma Paternal Grandmother     Social History   Socioeconomic History  . Marital status: Single    Spouse name: Not on file  . Number of children: Not on file  . Years of education: Not on file  . Highest education level: Not on file  Occupational History  . Not on file  Tobacco Use  . Smoking status: Current Every Day Smoker    Packs/day: 0.50    Types: Cigarettes   . Smokeless tobacco: Never Used  . Tobacco comment: less then 1/2 a pack a day.  prev able to tolerate chantix  Substance and Sexual Activity  . Alcohol use: Yes    Comment: social drinker  . Drug use: No  . Sexual activity: Not on file  Other Topics Concern  . Not on file  Social History Narrative  . Not on file   Social Determinants of Health   Financial Resource Strain:   . Difficulty of Paying Living Expenses: Not on file  Food Insecurity:   . Worried About Charity fundraiser in the Last Year: Not on file  . Ran Out of Food in the Last Year: Not on file  Transportation Needs:   . Lack of Transportation (Medical): Not on file  . Lack of Transportation (Non-Medical): Not on file  Physical Activity:   . Days of Exercise per Week: Not on file  . Minutes of Exercise per Session: Not on file  Stress:   . Feeling of Stress : Not on file  Social Connections:   . Frequency of Communication with Friends and Family: Not on file  . Frequency of Social Gatherings with Friends and Family: Not on file  . Attends Religious Services: Not on file  . Active Member of Clubs or Organizations: Not on file  . Attends Archivist Meetings: Not on file  . Marital Status: Not on file  Intimate Partner Violence:   . Fear of Current or Ex-Partner: Not on file  . Emotionally Abused: Not on file  . Physically Abused: Not on file  . Sexually Abused: Not on file     Constitutional: Pt reports intermittent headaches. Denies fever, malaise, fatigue, or abrupt weight changes.  HEENT: Denies eye pain, eye redness, ear pain, ringing in the ears, wax buildup, runny nose, nasal congestion, bloody nose, or sore throat. Respiratory: Denies difficulty breathing, shortness of breath, cough or sputum production.   Cardiovascular: Denies chest pain, chest tightness, palpitations or swelling in the hands or feet.  Gastrointestinal: Denies abdominal pain, bloating, constipation, diarrhea or blood in  the stool.  GU: Denies urgency, frequency, pain with urination, burning sensation, blood in urine, odor or discharge. Musculoskeletal: Denies decrease in range of motion, difficulty with gait, muscle pain or joint pain and swelling.  Skin: Denies redness, rashes, lesions or ulcercations.  Neurological: Denies dizziness, difficulty with memory, difficulty with speech or problems with balance and coordination.  Psych: Pt reports anxiety. Denies depression, SI/HI.  No other specific complaints in a complete review of systems (except as listed in HPI above).  Objective:   Physical Exam  BP 124/80   Pulse 66   Temp 98 F (36.7 C) (Temporal)   Ht 5\' 4"  (  1.626 m)   Wt 140 lb (63.5 kg)   SpO2 99%   BMI 24.03 kg/m   Wt Readings from Last 3 Encounters:  12/16/18 143 lb (64.9 kg)  10/21/18 143 lb (64.9 kg)  09/10/18 143 lb (64.9 kg)    General: Appears her stated age, well developed, well nourished in NAD. Skin: Warm, dry and intact. No rashes, lesions or ulcerations noted. HEENT: Head: normal shape and size; Eyes: sclera white, no icterus, conjunctiva pink, PERRLA and EOMs intact. Cardiovascular: Normal rate and rhythm. S1,S2 noted.  No murmur, rubs or gallops noted. No JVD or BLE edema. Pulmonary/Chest: Normal effort and positive vesicular breath sounds. No respiratory distress. No wheezes, rales or ronchi noted.  Abdomen: Soft and nontender. Normal bowel sounds. No distention or masses noted. Liver, spleen and kidneys non palpable. Musculoskeletal: Strength 5/5 BUE/BLE. No difficulty with gait.  Neurological: Alert and oriented. Cranial nerves II-XII grossly intact. Coordination normal.  Psychiatric: Mood and affect normal. Behavior is normal. Judgment and thought content normal.        Assessment & Plan:   Preventative Health Maintenance:  Flu shot UTD She declines tetanus vaccine today Pap smear: UTD Mammogram: UTD Colon screening: referred to gastroenterology  Encouraged  her to consume a balanced diet and exercise regimen Advised her to see an eye doctor and dentist annually Will check CBC, CMET, Lipid and Vit D today  RTC in 1 year, sooner if needed Webb Silversmith, NP This visit occurred during the SARS-CoV-2 public health emergency.  Safety protocols were in place, including screening questions prior to the visit, additional usage of staff PPE, and extensive cleaning of exam room while observing appropriate contact time as indicated for disinfecting solutions.

## 2019-01-21 NOTE — Assessment & Plan Note (Signed)
Continue Valtrex as needed

## 2019-01-21 NOTE — Assessment & Plan Note (Signed)
Continue Paroxetine Support offered today

## 2019-01-24 MED ORDER — VITAMIN D (ERGOCALCIFEROL) 1.25 MG (50000 UNIT) PO CAPS: 50000.0000 [IU] | ORAL_CAPSULE | ORAL | 0 refills | Status: DC

## 2019-01-24 MED ORDER — ATORVASTATIN CALCIUM 10 MG PO TABS: 10.0000 mg | ORAL_TABLET | Freq: Every day | ORAL | 0 refills | Status: DC

## 2019-01-24 NOTE — Addendum Note (Signed)
Addended by: Lurlean Nanny on: 01/24/2019 01:19 PM   Modules accepted: Orders

## 2019-01-26 ENCOUNTER — Other Ambulatory Visit: Payer: Self-pay | Admitting: Neurology

## 2019-01-31 ENCOUNTER — Telehealth: Payer: Self-pay

## 2019-01-31 ENCOUNTER — Other Ambulatory Visit: Payer: Self-pay

## 2019-01-31 DIAGNOSIS — Z1211 Encounter for screening for malignant neoplasm of colon: Secondary | ICD-10-CM

## 2019-01-31 NOTE — Telephone Encounter (Signed)
Gastroenterology Pre-Procedure Review  Request Date: Friday 03/07/19 Requesting Physician: Dr. Vicente Males  PATIENT REVIEW QUESTIONS: The patient responded to the following health history questions as indicated:    1. Are you having any GI issues? yes (Acid Reflux-just started pantoprazole) 2. Do you have a personal history of Polyps? no 3. Do you have a family history of Colon Cancer or Polyps? yes (father colon polyps) 4. Diabetes Mellitus? no 5. Joint replacements in the past 12 months?no 6. Major health problems in the past 3 months?no 7. Any artificial heart valves, MVP, or defibrillator?no    MEDICATIONS & ALLERGIES:    Patient reports the following regarding taking any anticoagulation/antiplatelet therapy:   Plavix, Coumadin, Eliquis, Xarelto, Lovenox, Pradaxa, Brilinta, or Effient? no Aspirin? yes (81 mg daily)  Patient confirms/reports the following medications:  Current Outpatient Medications  Medication Sig Dispense Refill  . atorvastatin (LIPITOR) 10 MG tablet Take 1 tablet (10 mg total) by mouth daily. 90 tablet 0  . butalbital-acetaminophen-caffeine (FIORICET) 50-325-40 MG tablet Take 1-2 tablets by mouth every 6 (six) hours as needed for headache. 20 tablet 0  . Flaxseed, Linseed, (FLAX SEEDS PO) Take by mouth.    . meloxicam (MOBIC) 15 MG tablet TAKE 1 TABLET BY MOUTH EVERY DAY 30 tablet 3  . Nutritional Supplements (ESTROVEN PO) Take by mouth.    . pantoprazole (PROTONIX) 40 MG tablet Take 1 tablet (40 mg total) by mouth daily. 90 tablet 3  . PARoxetine (PAXIL) 40 MG tablet TAKE 1 TABLET(40 MG) BY MOUTH EVERY MORNING 90 tablet 0  . primidone (MYSOLINE) 50 MG tablet TAKE 2 TABLETS BY MOUTH AT  BEDTIME 180 tablet 3  . propranolol ER (INDERAL LA) 160 MG SR capsule TAKE 1 CAPSULE BY MOUTH  DAILY 90 capsule 3  . Rhubarb (ESTROVEN MENOPAUSE RELIEF) 4 MG TABS Take by mouth.    . triamcinolone cream (KENALOG) 0.1 % Apply 1 application topically 2 (two) times daily. 453.6 g 1  .  valACYclovir (VALTREX) 500 MG tablet Take 1 tablet (500 mg total) by mouth 2 (two) times daily as needed. SCHEDULE PHYSICAL 90 tablet 0  . VENTOLIN HFA 108 (90 Base) MCG/ACT inhaler TAKE 2 PUFFS BY MOUTH EVERY 6 HOURS AS NEEDED FOR WHEEZE OR SHORTNESS OF BREATH 18 Inhaler 0  . Vitamin D, Ergocalciferol, (DRISDOL) 1.25 MG (50000 UNIT) CAPS capsule Take 1 capsule (50,000 Units total) by mouth every 7 (seven) days. 12 capsule 0   Current Facility-Administered Medications  Medication Dose Route Frequency Provider Last Rate Last Admin  . betamethasone acetate-betamethasone sodium phosphate (CELESTONE) injection 3 mg  3 mg Intramuscular Once Edrick Kins, DPM        Patient confirms/reports the following allergies:  Allergies  Allergen Reactions  . Latex Itching  . Sulfa Antibiotics Rash    No orders of the defined types were placed in this encounter.   AUTHORIZATION INFORMATION Primary Insurance: 1D#: Group #:  Secondary Insurance: 1D#: Group #:  SCHEDULE INFORMATION: Date: Friday 03/07/19 Time: Location:[facility]

## 2019-02-07 ENCOUNTER — Other Ambulatory Visit: Payer: Self-pay | Admitting: Internal Medicine

## 2019-02-09 ENCOUNTER — Other Ambulatory Visit: Payer: Self-pay | Admitting: Internal Medicine

## 2019-02-10 ENCOUNTER — Other Ambulatory Visit: Payer: Self-pay

## 2019-02-10 MED ORDER — BUTALBITAL-APAP-CAFFEINE 50-325-40 MG PO TABS: 1.0000 | ORAL_TABLET | Freq: Four times a day (QID) | ORAL | 0 refills | Status: DC | PRN

## 2019-02-10 NOTE — Telephone Encounter (Signed)
Pt contacted office for refill of fioricet that she reports was denied a refill request from the pharmacy. Could not find a reason why refill was denied. Advised a msg would be sent to another provider since R. Garnette Gunner is not in today or tomorrow. Pt reports she has had a HA for about a week.  Last fill 01/21/19, # 20, take 1-2 tablets q6h, prn. Last OV 01/21/19

## 2019-02-13 ENCOUNTER — Encounter: Payer: Self-pay | Admitting: Internal Medicine

## 2019-02-13 ENCOUNTER — Other Ambulatory Visit: Payer: Self-pay

## 2019-02-13 ENCOUNTER — Ambulatory Visit (INDEPENDENT_AMBULATORY_CARE_PROVIDER_SITE_OTHER): Payer: Managed Care, Other (non HMO) | Admitting: Internal Medicine

## 2019-02-13 VITALS — Wt 141.0 lb

## 2019-02-13 DIAGNOSIS — G43C1 Periodic headache syndromes in child or adult, intractable: Secondary | ICD-10-CM

## 2019-02-13 DIAGNOSIS — R110 Nausea: Secondary | ICD-10-CM

## 2019-02-13 DIAGNOSIS — K219 Gastro-esophageal reflux disease without esophagitis: Secondary | ICD-10-CM

## 2019-02-13 DIAGNOSIS — R0989 Other specified symptoms and signs involving the circulatory and respiratory systems: Secondary | ICD-10-CM

## 2019-02-13 MED ORDER — BUTALBITAL-APAP-CAFFEINE 50-325-40 MG PO TABS: 1.0000 | ORAL_TABLET | Freq: Four times a day (QID) | ORAL | 2 refills | Status: DC | PRN

## 2019-02-13 MED ORDER — ONDANSETRON HCL 4 MG PO TABS: 4.0000 mg | ORAL_TABLET | Freq: Three times a day (TID) | ORAL | 2 refills | Status: DC | PRN

## 2019-02-13 NOTE — Progress Notes (Signed)
Virtual Visit via Video Note  I connected with Kristie Miles on 02/13/19 at 10:00 AM EST by a video enabled telemedicine application and verified that I am speaking with the correct person using two identifiers.  Location: Patient: Home Provider: Office   I discussed the limitations of evaluation and management by telemedicine and the availability of in person appointments. The patient expressed understanding and agreed to proceed.  History of Present Illness:  Pt reports headache, runny nose and nausea. This started 2 weeks ago. The headache alternates on the left and right side of her head.  She reports it feels like a typical migraine. She reports associated sensitivity to light, sound and nausea. She denies dizziness, visual changes or vomiting.  She is blowing mucus out of her nose.  She denies sinus pressure, nasal congestion, ear pain, loss of taste or smell, sore throat, cough or shortness of breath.  She fever, chills or body aches.  She has tried Excedrin, Ibuprofen, Tylenol but only gets relief with Fioricet.  She is on Propanolol daily for migraine prevention.  She has a history of GERD but feels like her Pantoprazole is not a visit once was an thanks that might be where the nausea is coming from.  Her bowels are moving normally.  She denies recent changes in medications or diet.  She denies exposure to Covid that she is aware of.    Past Medical History:  Diagnosis Date  . Allergy   . Asthma   . GERD (gastroesophageal reflux disease)   . Hyperlipidemia   . Smoking     Current Outpatient Medications  Medication Sig Dispense Refill  . atorvastatin (LIPITOR) 10 MG tablet Take 1 tablet (10 mg total) by mouth daily. 90 tablet 0  . butalbital-acetaminophen-caffeine (FIORICET) 50-325-40 MG tablet Take 1-2 tablets by mouth every 6 (six) hours as needed for headache. 20 tablet 0  . Flaxseed, Linseed, (FLAX SEEDS PO) Take by mouth.    . meloxicam (MOBIC) 15 MG tablet TAKE 1 TABLET BY  MOUTH EVERY DAY 30 tablet 3  . Nutritional Supplements (ESTROVEN PO) Take by mouth.    . pantoprazole (PROTONIX) 40 MG tablet Take 1 tablet (40 mg total) by mouth daily. 90 tablet 3  . PARoxetine (PAXIL) 40 MG tablet TAKE 1 TABLET(40 MG) BY MOUTH EVERY MORNING 90 tablet 0  . primidone (MYSOLINE) 50 MG tablet TAKE 2 TABLETS BY MOUTH AT  BEDTIME 180 tablet 3  . propranolol ER (INDERAL LA) 160 MG SR capsule TAKE 1 CAPSULE BY MOUTH  DAILY 90 capsule 3  . Rhubarb (ESTROVEN MENOPAUSE RELIEF) 4 MG TABS Take by mouth.    . triamcinolone cream (KENALOG) 0.1 % Apply 1 application topically 2 (two) times daily. 453.6 g 1  . valACYclovir (VALTREX) 500 MG tablet Take 1 tablet (500 mg total) by mouth 2 (two) times daily as needed. SCHEDULE PHYSICAL 90 tablet 0  . VENTOLIN HFA 108 (90 Base) MCG/ACT inhaler TAKE 2 PUFFS BY MOUTH EVERY 6 HOURS AS NEEDED FOR WHEEZE OR SHORTNESS OF BREATH 18 Inhaler 0  . Vitamin D, Ergocalciferol, (DRISDOL) 1.25 MG (50000 UNIT) CAPS capsule Take 1 capsule (50,000 Units total) by mouth every 7 (seven) days. 12 capsule 0   Current Facility-Administered Medications  Medication Dose Route Frequency Provider Last Rate Last Admin  . betamethasone acetate-betamethasone sodium phosphate (CELESTONE) injection 3 mg  3 mg Intramuscular Once Edrick Kins, DPM        Allergies  Allergen Reactions  . Latex  Itching  . Sulfa Antibiotics Rash    Family History  Problem Relation Age of Onset  . Arthritis Mother   . Hyperlipidemia Mother   . Hypertension Mother   . Asthma Father   . Kidney cancer Father   . Kidney disease Father   . Alcohol abuse Maternal Grandmother   . Arthritis Maternal Grandmother   . Heart disease Maternal Grandfather   . Arthritis Paternal Grandmother   . Asthma Paternal Grandmother     Social History   Socioeconomic History  . Marital status: Single    Spouse name: Not on file  . Number of children: Not on file  . Years of education: Not on file  .  Highest education level: Not on file  Occupational History  . Not on file  Tobacco Use  . Smoking status: Current Every Day Smoker    Packs/day: 0.50    Types: Cigarettes  . Smokeless tobacco: Never Used  . Tobacco comment: less then 1/2 a pack a day.  prev able to tolerate chantix  Substance and Sexual Activity  . Alcohol use: Yes    Comment: social drinker  . Drug use: No  . Sexual activity: Not on file  Other Topics Concern  . Not on file  Social History Narrative  . Not on file   Social Determinants of Health   Financial Resource Strain:   . Difficulty of Paying Living Expenses: Not on file  Food Insecurity:   . Worried About Charity fundraiser in the Last Year: Not on file  . Ran Out of Food in the Last Year: Not on file  Transportation Needs:   . Lack of Transportation (Medical): Not on file  . Lack of Transportation (Non-Medical): Not on file  Physical Activity:   . Days of Exercise per Week: Not on file  . Minutes of Exercise per Session: Not on file  Stress:   . Feeling of Stress : Not on file  Social Connections:   . Frequency of Communication with Friends and Family: Not on file  . Frequency of Social Gatherings with Friends and Family: Not on file  . Attends Religious Services: Not on file  . Active Member of Clubs or Organizations: Not on file  . Attends Archivist Meetings: Not on file  . Marital Status: Not on file  Intimate Partner Violence:   . Fear of Current or Ex-Partner: Not on file  . Emotionally Abused: Not on file  . Physically Abused: Not on file  . Sexually Abused: Not on file     Constitutional: Pt reports headache. Denies fever, malaise, fatigue, or abrupt weight changes.  HEENT: Pt reports runny nose. Denies eye pain, eye redness, ear pain, ringing in the ears, wax buildup, nasal congestion, bloody nose, or sore throat. Respiratory: Denies difficulty breathing, shortness of breath, cough or sputum production.    Cardiovascular: Denies chest pain, chest tightness, palpitations or swelling in the hands or feet.  Gastrointestinal: Pt reports nausea, reflux. Denies abdominal pain, bloating, constipation, diarrhea or blood in the stool.  Musculoskeletal: Denies neck pain.  Skin: Denies redness, rashes, lesions or ulcercations.  Neurological: Denies dizziness, difficulty with memory, difficulty with speech or problems with balance and coordination.  Psych: Patient reports increased stress.  Denies anxiety, depression, SI/HI.  No other specific complaints in a complete review of systems (except as listed in HPI above).  Observations/Objective: Wt 141 lb (64 kg)   BMI 24.20 kg/m   Wt Readings from  Last 3 Encounters:  01/21/19 140 lb (63.5 kg)  12/16/18 143 lb (64.9 kg)  10/21/18 143 lb (64.9 kg)    General: Appears her stated age, in NAD. HEENT: Head: normal shape and size; Eyes: EOMs intact; Ears: Tm's gray and intact, normal light reflex; Nose: No congestion noted; Throat/Mouth: No hoarseness noted Pulmonary/Chest: Normal effort. No respiratory distress.  Neurological: Alert and oriented.  Psychiatric: Mood and affect normal.    BMET    Component Value Date/Time   NA 137 01/21/2019 1259   K 3.8 01/21/2019 1259   CL 102 01/21/2019 1259   CO2 30 01/21/2019 1259   GLUCOSE 111 (H) 01/21/2019 1259   BUN 19 01/21/2019 1259   CREATININE 0.68 01/21/2019 1259   CALCIUM 9.6 01/21/2019 1259    Lipid Panel     Component Value Date/Time   CHOL 291 (H) 01/21/2019 1259   TRIG 329.0 (H) 01/21/2019 1259   HDL 53.60 01/21/2019 1259   CHOLHDL 5 01/21/2019 1259   VLDL 65.8 (H) 01/21/2019 1259    CBC    Component Value Date/Time   WBC 5.2 01/21/2019 1259   RBC 4.10 01/21/2019 1259   HGB 13.3 01/21/2019 1259   HCT 40.0 01/21/2019 1259   PLT 304.0 01/21/2019 1259   MCV 97.5 01/21/2019 1259   MCHC 33.3 01/21/2019 1259   RDW 13.4 01/21/2019 1259    Hgb A1C No results found for:  HGBA1C      Assessment and Plan:  Migraine, Runny Nose, Nausea:  Could be combination of allergies and stress Continue Propanolol and Fioricet, Fioricet refilled today Start antihistamine OTC such as Claritin, Allegra, Zyrtec or Xyzal along with Flonase  GERD:  Increase Pantoprazole to 40 mg twice daily x2 weeks Rx for Zofran 4 mg 3 times daily as needed  Follow Up Instructions:    I discussed the assessment and treatment plan with the patient. The patient was provided an opportunity to ask questions and all were answered. The patient agreed with the plan and demonstrated an understanding of the instructions.   The patient was advised to call back or seek an in-person evaluation if the symptoms worsen or if the condition fails to improve as anticipated.   Webb Silversmith, NP

## 2019-02-13 NOTE — Patient Instructions (Signed)
Nausea, Adult Nausea is feeling sick to your stomach or feeling that you are about to throw up (vomit). Feeling sick to your stomach is usually not serious, but it may be an early sign of a more serious medical problem. As you feel sicker to your stomach, you may throw up. If you throw up, or if you are not able to drink enough fluids, there is a risk that you may lose too much water in your body (get dehydrated). If you lose too much water in your body, you may:  Feel tired.  Feel thirsty.  Have a dry mouth.  Have cracked lips.  Go pee (urinate) less often. Older adults and people who have other diseases or a weak body defense system (immune system) have a higher risk of losing too much water in the body. The main goals of treating this condition are:  To relieve your nausea.  To ensure your nausea occurs less often.  To prevent throwing up and losing too much fluid. Follow these instructions at home: Watch your symptoms for any changes. Tell your doctor about them. Follow these instructions as told by your doctor. Eating and drinking      Take an ORS (oral rehydration solution). This is a drink that is sold at pharmacies and stores.  Drink clear fluids in small amounts as you are able. These include: ? Water. ? Ice chips. ? Fruit juice that has water added (diluted fruit juice). ? Low-calorie sports drinks.  Eat bland, easy-to-digest foods in small amounts as you are able, such as: ? Bananas. ? Applesauce. ? Rice. ? Low-fat (lean) meats. ? Toast. ? Crackers.  Avoid drinking fluids that have a lot of sugar or caffeine in them. This includes energy drinks, sports drinks, and soda.  Avoid alcohol.  Avoid spicy or fatty foods. General instructions  Take over-the-counter and prescription medicines only as told by your doctor.  Rest at home while you get better.  Drink enough fluid to keep your pee (urine) pale yellow.  Take slow and deep breaths when you feel  sick to your stomach.  Avoid food or things that have strong smells.  Wash your hands often with soap and water. If you cannot use soap and water, use hand sanitizer.  Make sure that all people in your home wash their hands well and often.  Keep all follow-up visits as told by your doctor. This is important. Contact a doctor if:  You feel sicker to your stomach.  You feel sick to your stomach for more than 2 days.  You throw up.  You are not able to drink fluids without throwing up.  You have new symptoms.  You have a fever.  You have a headache.  You have muscle cramps.  You have a rash.  You have pain while peeing.  You feel light-headed or dizzy. Get help right away if:  You have pain in your chest, neck, arm, or jaw.  You feel very weak or you pass out (faint).  You have throw up that is bright red or looks like coffee grounds.  You have bloody or black poop (stools) or poop that looks like tar.  You have a very bad headache, a stiff neck, or both.  You have very bad pain, cramping, or bloating in your belly (abdomen).  You have trouble breathing or you are breathing very quickly.  Your heart is beating very quickly.  Your skin feels cold and clammy.  You feel confused.    You have signs of losing too much water in your body, such as: ? Dark pee, very little pee, or no pee. ? Cracked lips. ? Dry mouth. ? Sunken eyes. ? Sleepiness. ? Weakness. These symptoms may be an emergency. Do not wait to see if the symptoms will go away. Get medical help right away. Call your local emergency services (911 in the [place] Do not drive yourself to the hospital. Summary  Nausea is feeling sick to your stomach or feeling that you are about to throw up (vomit).  If you throw up, or if you are not able to drink enough fluids, there is a risk that you may lose too much water in your body (get dehydrated).  Eat and drink what your doctor tells you. Take  over-the-counter and prescription medicines only as told by your doctor.  Contact a doctor right away if your symptoms get worse or you have new symptoms.  Keep all follow-up visits as told by your doctor. This is important. This information is not intended to replace advice given to you by your health care provider. Make sure you discuss any questions you have with your health care provider. Document Revised: 05/29/2017 Document Reviewed: 05/29/2017 Elsevier Patient Education  2020 [employer]  

## 2019-03-05 ENCOUNTER — Ambulatory Visit (INDEPENDENT_AMBULATORY_CARE_PROVIDER_SITE_OTHER): Payer: Managed Care, Other (non HMO) | Admitting: Internal Medicine

## 2019-03-05 ENCOUNTER — Encounter: Payer: Self-pay | Admitting: Internal Medicine

## 2019-03-05 ENCOUNTER — Other Ambulatory Visit: Admission: RE | Admit: 2019-03-05 | Payer: Managed Care, Other (non HMO) | Source: Ambulatory Visit

## 2019-03-05 ENCOUNTER — Other Ambulatory Visit: Payer: Self-pay | Admitting: Internal Medicine

## 2019-03-05 DIAGNOSIS — R519 Headache, unspecified: Secondary | ICD-10-CM

## 2019-03-05 DIAGNOSIS — K219 Gastro-esophageal reflux disease without esophagitis: Secondary | ICD-10-CM

## 2019-03-05 MED ORDER — BUTALBITAL-APAP-CAFFEINE 50-325-40 MG PO TABS: 1.0000 | ORAL_TABLET | Freq: Four times a day (QID) | ORAL | 2 refills | Status: DC | PRN

## 2019-03-05 MED ORDER — NORTRIPTYLINE HCL 10 MG PO CAPS: 10.0000 mg | ORAL_CAPSULE | Freq: Every day | ORAL | 2 refills | Status: DC

## 2019-03-05 MED ORDER — ESOMEPRAZOLE MAGNESIUM 40 MG PO CPDR: 40.0000 mg | DELAYED_RELEASE_CAPSULE | Freq: Every day | ORAL | 3 refills | Status: DC

## 2019-03-05 NOTE — Progress Notes (Signed)
Virtual Visit via Video Note  I connected with Kristie Miles on 03/05/19 at  3:00 PM EST by a video enabled telemedicine application and verified that I am speaking with the correct person using two identifiers.  Location: Patient: Home Provider: Office   I discussed the limitations of evaluation and management by telemedicine and the availability of in person appointments. The patient expressed understanding and agreed to proceed.  History of Present Illness:  Pt reports frequent migraines. These have increased in frequency over the last few months.The pain is located on the left side of her head. She describes the pain as sharp, throbbing. She reports associated sensitivity to light, sound and nausea. She denies dizziness, visual changes or vomiting. She reports the sinus issues she was having before have resolved with OTC antihistamine and Flonase. She takes Propanolol and Fioricet as prescribed but feels like it is not effective as it used to be. She was seen 02/13/19 for the same. She has been on Topamax and Nortriptyline in the past. Imitrex and Maxalt never really worked for her. She has a neurology appt on Friday.  She also feels like her reflux is not any better despite Pantoprazole and Zofran. She is not sure what is triggering this unless it could be stress. She is not taking Meloxicam or NSAID's OTC. She has take Prilosec and Nexium OTC in the past with good relief but insurance would not cover. She denies abdominal pain, gassiness, bloating, constipation, diarrhea or blood in her stool. She has not seen a GI specialist for this.. Past Medical History:  Diagnosis Date  . Allergy   . Asthma   . GERD (gastroesophageal reflux disease)   . Hyperlipidemia   . Smoking     Current Outpatient Medications  Medication Sig Dispense Refill  . atorvastatin (LIPITOR) 10 MG tablet Take 1 tablet (10 mg total) by mouth daily. 90 tablet 0  . butalbital-acetaminophen-caffeine (FIORICET) 50-325-40  MG tablet Take 1-2 tablets by mouth every 6 (six) hours as needed for headache. 20 tablet 2  . Flaxseed, Linseed, (FLAX SEEDS PO) Take by mouth.    . meloxicam (MOBIC) 15 MG tablet TAKE 1 TABLET BY MOUTH EVERY DAY 30 tablet 3  . Nutritional Supplements (ESTROVEN PO) Take by mouth.    . ondansetron (ZOFRAN) 4 MG tablet Take 1 tablet (4 mg total) by mouth every 8 (eight) hours as needed. 20 tablet 2  . pantoprazole (PROTONIX) 40 MG tablet Take 1 tablet (40 mg total) by mouth daily. 90 tablet 3  . PARoxetine (PAXIL) 40 MG tablet TAKE 1 TABLET(40 MG) BY MOUTH EVERY MORNING 90 tablet 0  . primidone (MYSOLINE) 50 MG tablet TAKE 2 TABLETS BY MOUTH AT  BEDTIME 180 tablet 3  . propranolol ER (INDERAL LA) 160 MG SR capsule TAKE 1 CAPSULE BY MOUTH  DAILY 90 capsule 3  . Rhubarb (ESTROVEN MENOPAUSE RELIEF) 4 MG TABS Take by mouth.    . triamcinolone cream (KENALOG) 0.1 % Apply 1 application topically 2 (two) times daily. 453.6 g 1  . valACYclovir (VALTREX) 500 MG tablet Take 1 tablet (500 mg total) by mouth 2 (two) times daily as needed. SCHEDULE PHYSICAL 90 tablet 0  . VENTOLIN HFA 108 (90 Base) MCG/ACT inhaler TAKE 2 PUFFS BY MOUTH EVERY 6 HOURS AS NEEDED FOR WHEEZE OR SHORTNESS OF BREATH 18 Inhaler 0  . Vitamin D, Ergocalciferol, (DRISDOL) 1.25 MG (50000 UNIT) CAPS capsule Take 1 capsule (50,000 Units total) by mouth every 7 (seven) days. 12 capsule  0   No current facility-administered medications for this visit.    Allergies  Allergen Reactions  . Latex Itching  . Sulfa Antibiotics Rash    Family History  Problem Relation Age of Onset  . Arthritis Mother   . Hyperlipidemia Mother   . Hypertension Mother   . Asthma Father   . Kidney cancer Father   . Kidney disease Father   . Alcohol abuse Maternal Grandmother   . Arthritis Maternal Grandmother   . Heart disease Maternal Grandfather   . Arthritis Paternal Grandmother   . Asthma Paternal Grandmother     Social History   Socioeconomic  History  . Marital status: Single    Spouse name: Not on file  . Number of children: Not on file  . Years of education: Not on file  . Highest education level: Not on file  Occupational History  . Not on file  Tobacco Use  . Smoking status: Current Every Day Smoker    Packs/day: 0.50    Types: Cigarettes  . Smokeless tobacco: Never Used  . Tobacco comment: less then 1/2 a pack a day.  prev able to tolerate chantix  Substance and Sexual Activity  . Alcohol use: Yes    Comment: social drinker  . Drug use: No  . Sexual activity: Not on file  Other Topics Concern  . Not on file  Social History Narrative  . Not on file   Social Determinants of Health   Financial Resource Strain:   . Difficulty of Paying Living Expenses: Not on file  Food Insecurity:   . Worried About Charity fundraiser in the Last Year: Not on file  . Ran Out of Food in the Last Year: Not on file  Transportation Needs:   . Lack of Transportation (Medical): Not on file  . Lack of Transportation (Non-Medical): Not on file  Physical Activity:   . Days of Exercise per Week: Not on file  . Minutes of Exercise per Session: Not on file  Stress:   . Feeling of Stress : Not on file  Social Connections:   . Frequency of Communication with Friends and Family: Not on file  . Frequency of Social Gatherings with Friends and Family: Not on file  . Attends Religious Services: Not on file  . Active Member of Clubs or Organizations: Not on file  . Attends Archivist Meetings: Not on file  . Marital Status: Not on file  Intimate Partner Violence:   . Fear of Current or Ex-Partner: Not on file  . Emotionally Abused: Not on file  . Physically Abused: Not on file  . Sexually Abused: Not on file     Constitutional: Pt reports headache. Denies fever, malaise, fatigue, or abrupt weight changes.  HEENT: Denies eye pain, eye redness, ear pain, ringing in the ears, wax buildup, runny nose, nasal congestion, bloody  nose, or sore throat. Respiratory: Denies difficulty breathing, shortness of breath, cough or sputum production.   Cardiovascular: Denies chest pain, chest tightness, palpitations or swelling in the hands or feet.  Gastrointestinal: Pt reports nausea, reflux. Denies abdominal pain, bloating, constipation, diarrhea or blood in the stool.  GU: Denies urgency, frequency, pain with urination, burning sensation, blood in urine, odor or discharge. Neurological: Denies dizziness, difficulty with memory, difficulty with speech or problems with balance and coordination.  Psych: Pt reports increased stress. Denies anxiety, depression, SI/HI.  No other specific complaints in a complete review of systems (except as listed in  HPI above).  Observations/Objective: There were no vitals taken for this visit. Wt Readings from Last 3 Encounters:  02/13/19 141 lb (64 kg)  01/21/19 140 lb (63.5 kg)  12/16/18 143 lb (64.9 kg)    General: Appears her stated age, well developed, well nourished in NAD. HEENT: Head: normal shape and size; Eyes: EOMs intact;  Pulmonary/Chest: Normal effort. No respiratory distress.  Abdomen: Soft and nontender. Normal bowel sounds. No distention or masses noted. Liver, spleen and kidneys non palpable. Musculoskeletal: Normal range of motion of the neck. Neurological: Alert and oriented.  Coordination normal.  Psychiatric: Mood and affect normal. Behavior is normal. Judgment and thought content normal.     BMET    Component Value Date/Time   NA 137 01/21/2019 1259   K 3.8 01/21/2019 1259   CL 102 01/21/2019 1259   CO2 30 01/21/2019 1259   GLUCOSE 111 (H) 01/21/2019 1259   BUN 19 01/21/2019 1259   CREATININE 0.68 01/21/2019 1259   CALCIUM 9.6 01/21/2019 1259    Lipid Panel     Component Value Date/Time   CHOL 291 (H) 01/21/2019 1259   TRIG 329.0 (H) 01/21/2019 1259   HDL 53.60 01/21/2019 1259   CHOLHDL 5 01/21/2019 1259   VLDL 65.8 (H) 01/21/2019 1259    CBC     Component Value Date/Time   WBC 5.2 01/21/2019 1259   RBC 4.10 01/21/2019 1259   HGB 13.3 01/21/2019 1259   HCT 40.0 01/21/2019 1259   PLT 304.0 01/21/2019 1259   MCV 97.5 01/21/2019 1259   MCHC 33.3 01/21/2019 1259   RDW 13.4 01/21/2019 1259    Hgb A1C No results found for: HGBA1C      Assessment and Plan:  Migraines:  Deteriorated She takes Propanolol for tremor she reports, not for migraine prophylaxis RX for Nortriptyline 10 mg PO QHS- sedation caution given Fioricet refilled today Follow up with neurology as planned  Reflux:  D/c Pantoprazole RX for Nexium 40 mg daily  Avoid NSAID's OTC Will have her follow up with GI if no improvement in symptoms  Return precautions discussed  Follow Up Instructions:    I discussed the assessment and treatment plan with the patient. The patient was provided an opportunity to ask questions and all were answered. The patient agreed with the plan and demonstrated an understanding of the instructions.   The patient was advised to call back or seek an in-person evaluation if the symptoms worsen or if the condition fails to improve as anticipated.    Webb Silversmith, NP

## 2019-03-05 NOTE — Patient Instructions (Signed)
General Headache Without Cause A headache is pain or discomfort that is felt around the head or neck area. There are many causes and types of headaches. In some cases, the cause may not be found. Follow these instructions at home: Watch your condition for any changes. Let your doctor know about them. Take these steps to help with your condition: Managing pain      Take over-the-counter and prescription medicines only as told by your doctor.  Lie down in a dark, quiet room when you have a headache.  If told, put ice on your head and neck area: ? Put ice in a plastic bag. ? Place a towel between your skin and the bag. ? Leave the ice on for 20 minutes, 2-3 times per day.  If told, put heat on the affected area. Use the heat source that your doctor recommends, such as a moist heat pack or a heating pad. ? Place a towel between your skin and the heat source. ? Leave the heat on for 20-30 minutes. ? Remove the heat if your skin turns bright red. This is very important if you are unable to feel pain, heat, or cold. You may have a greater risk of getting burned.  Keep lights dim if bright lights bother you or make your headaches worse. Eating and drinking  Eat meals on a regular schedule.  If you drink alcohol: ? Limit how much you use to:  0-1 drink a day for women.  0-2 drinks a day for men. ? Be aware of how much alcohol is in your drink. In the [place] one drink equals one 12 oz bottle of beer (355 mL), one 5 oz glass of wine (148 mL), or one 1 oz glass of hard liquor (44 mL).  Stop drinking caffeine, or reduce how much caffeine you drink. General instructions   Keep a journal to find out if certain things bring on headaches. For example, write down: ? What you eat and drink. ? How much sleep you get. ? Any change to your diet or medicines.  Get a massage or try other ways to relax.  Limit stress.  Sit up straight. Do not tighten (tense) your muscles.  Do not use any  products that contain nicotine or tobacco. This includes cigarettes, e-cigarettes, and chewing tobacco. If you need help quitting, ask your doctor.  Exercise regularly as told by your doctor.  Get enough sleep. This often means 7-9 hours of sleep each night.  Keep all follow-up visits as told by your doctor. This is important. Contact a doctor if:  Your symptoms are not helped by medicine.  You have a headache that feels different than the other headaches.  You feel sick to your stomach (nauseous) or you throw up (vomit).  You have a fever. Get help right away if:  Your headache gets very bad quickly.  Your headache gets worse after a lot of physical activity.  You keep throwing up.  You have a stiff neck.  You have trouble seeing.  You have trouble speaking.  You have pain in the eye or ear.  Your muscles are weak or you lose muscle control.  You lose your balance or have trouble walking.  You feel like you will pass out (faint) or you pass out.  You are mixed up (confused).  You have a seizure. Summary  A headache is pain or discomfort that is felt around the head or neck area.  There are many causes and   types of headaches. In some cases, the cause may not be found.  Keep a journal to help find out what causes your headaches. Watch your condition for any changes. Let your doctor know about them.  Contact a doctor if you have a headache that is different from usual, or if your headache is not helped by medicine.  Get help right away if your headache gets very bad, you throw up, you have trouble seeing, you lose your balance, or you have a seizure. This information is not intended to replace advice given to you by your health care provider. Make sure you discuss any questions you have with your health care provider. Document Revised: 07/09/2017 Document Reviewed: 07/09/2017 Elsevier Patient Education  2020 [employer]  

## 2019-03-06 ENCOUNTER — Other Ambulatory Visit: Payer: Self-pay

## 2019-03-10 ENCOUNTER — Ambulatory Visit: Payer: Managed Care, Other (non HMO) | Admitting: Adult Health

## 2019-03-10 ENCOUNTER — Other Ambulatory Visit: Payer: Self-pay

## 2019-03-10 ENCOUNTER — Encounter: Payer: Self-pay | Admitting: Adult Health

## 2019-03-10 VITALS — BP 108/66 | HR 60 | Temp 97.4°F | Ht 64.5 in | Wt 142.0 lb

## 2019-03-10 DIAGNOSIS — R519 Headache, unspecified: Secondary | ICD-10-CM

## 2019-03-10 DIAGNOSIS — G250 Essential tremor: Secondary | ICD-10-CM

## 2019-03-10 NOTE — Patient Instructions (Signed)
Your Plan:  Continue Propranolol and Primidone If your symptoms worsen or you develop new symptoms please let us know.   Thank you for coming to see [place] at [facility]. I hope we have been able to provide you high quality care today.  You may receive a patient satisfaction survey over the next few weeks. We would appreciate your feedback and comments so that we may continue to improve ourselves and the health of our patients.

## 2019-03-10 NOTE — Progress Notes (Addendum)
PATIENT: Kristie Miles DOB: August 11, 1965  REASON FOR VISIT: follow up HISTORY FROM: patient  HISTORY OF PRESENT ILLNESS: Today 03/10/19:  Kristie Miles is a 54 year old female with a history of essential tremor.  She returns today for follow-up.  She continues on primidone and propranolol.  She feels that the combination of these medications have been beneficial.  She states that she notices the tremor in both upper extremities.  She notices that with her handwriting and holding a glass.  She also states that she cannot pain her fingernails.  She also notes that she has migraine headaches.  This is currently being managed by her primary care provider.  Notes that she has had a daily headache for quite some time.  Was recently started on nortriptyline 10 mg at bedtime.  Also states that she typically takes Tylenol or ibuprofen on a daily basis.  HISTORY 09/10/2018: She reports that the Mysoline has been helpful.  She has been taking 100 mg each night.  She denies any significant side effects such as headaches or sleepiness.  She reports that her balance has not been very good but this is not a new problem since the primidone.  She has had some short-term memory issues, forgetfulness and forgetting conversations.  She is somewhat worried about this.  She has not had a physical with her primary care nurse practitioner and has not had any recent blood work.  She continues to take the propranolol.  She limits her caffeine to 1 serving of green tea and otherwise water.  She tries to hydrate well.  She does drink alcohol daily in the form of wine, 1 to 2 glasses/day on average.  She is motivated to quit smoking and also to curb her alcohol intake.  She has no family history of dementia.  Father died young with cancer at age 80, mother is 48 years old and has a tremor but good memory and neither grandparents set had any dementia as far as she recalls.  She is due for an eye examination, it has been a year, she has  prescription eyeglasses.  REVIEW OF SYSTEMS: Out of a complete 14 system review of symptoms, the patient complains only of the following symptoms, and all other reviewed systems are negative.  See HPI  ALLERGIES: Allergies  Allergen Reactions  . Other     Other reaction(s): rash/itching  . Latex Itching  . Sulfa Antibiotics Rash    HOME MEDICATIONS: Outpatient Medications Prior to Visit  Medication Sig Dispense Refill  . atorvastatin (LIPITOR) 10 MG tablet Take 1 tablet (10 mg total) by mouth daily. 90 tablet 0  . butalbital-acetaminophen-caffeine (FIORICET) 50-325-40 MG tablet Take 1-2 tablets by mouth every 6 (six) hours as needed for headache. 20 tablet 2  . cetirizine (ZYRTEC) 10 MG tablet Take 10 mg by mouth daily.    . Flaxseed, Linseed, (FLAX SEEDS PO) Take by mouth.    . fluticasone (FLONASE) 50 MCG/ACT nasal spray Place into both nostrils daily.    . meloxicam (MOBIC) 15 MG tablet TAKE 1 TABLET BY MOUTH EVERY DAY 30 tablet 3  . nortriptyline (PAMELOR) 10 MG capsule Take 1 capsule (10 mg total) by mouth at bedtime. 30 capsule 2  . Nutritional Supplements (ESTROVEN PO) Take by mouth.    . ondansetron (ZOFRAN) 4 MG tablet Take 1 tablet (4 mg total) by mouth every 8 (eight) hours as needed. 20 tablet 2  . PARoxetine (PAXIL) 40 MG tablet TAKE 1 TABLET(40 MG) BY  MOUTH EVERY MORNING 90 tablet 0  . primidone (MYSOLINE) 50 MG tablet TAKE 2 TABLETS BY MOUTH AT  BEDTIME 180 tablet 3  . propranolol ER (INDERAL LA) 160 MG SR capsule TAKE 1 CAPSULE BY MOUTH  DAILY 90 capsule 3  . Rhubarb (ESTROVEN MENOPAUSE RELIEF) 4 MG TABS Take by mouth.    . triamcinolone cream (KENALOG) 0.1 % Apply 1 application topically 2 (two) times daily. 453.6 g 1  . valACYclovir (VALTREX) 500 MG tablet Take 1 tablet (500 mg total) by mouth 2 (two) times daily as needed. SCHEDULE PHYSICAL 90 tablet 0  . VENTOLIN HFA 108 (90 Base) MCG/ACT inhaler TAKE 2 PUFFS BY MOUTH EVERY 6 HOURS AS NEEDED FOR WHEEZE OR  SHORTNESS OF BREATH 18 Inhaler 0  . Vitamin D, Ergocalciferol, (DRISDOL) 1.25 MG (50000 UNIT) CAPS capsule Take 1 capsule (50,000 Units total) by mouth every 7 (seven) days. 12 capsule 0  . pantoprazole (PROTONIX) 40 MG tablet Take 1 tablet (40 mg total) by mouth daily. 90 tablet 3  . esomeprazole (NEXIUM) 40 MG capsule Take 1 capsule (40 mg total) by mouth daily. 30 capsule 3   No facility-administered medications prior to visit.    PAST MEDICAL HISTORY: Past Medical History:  Diagnosis Date  . Allergy   . Asthma   . GERD (gastroesophageal reflux disease)   . Hyperlipidemia   . Smoking     PAST SURGICAL HISTORY: Past Surgical History:  Procedure Laterality Date  . FOOT SURGERY Bilateral     FAMILY HISTORY: Family History  Problem Relation Age of Onset  . Arthritis Mother   . Hyperlipidemia Mother   . Hypertension Mother   . Asthma Father   . Kidney cancer Father   . Kidney disease Father   . Alcohol abuse Maternal Grandmother   . Arthritis Maternal Grandmother   . Heart disease Maternal Grandfather   . Arthritis Paternal Grandmother   . Asthma Paternal Grandmother     SOCIAL HISTORY: Social History   Socioeconomic History  . Marital status: Single    Spouse name: Not on file  . Number of children: Not on file  . Years of education: Not on file  . Highest education level: Not on file  Occupational History  . Not on file  Tobacco Use  . Smoking status: Current Every Day Smoker    Packs/day: 0.50    Types: Cigarettes  . Smokeless tobacco: Never Used  . Tobacco comment: less then 1/2 a pack a day.  prev able to tolerate chantix  Substance and Sexual Activity  . Alcohol use: Yes    Comment: social drinker  . Drug use: No  . Sexual activity: Not on file  Other Topics Concern  . Not on file  Social History Narrative  . Not on file   Social Determinants of Health   Financial Resource Strain:   . Difficulty of Paying Living Expenses: Not on file  Food  Insecurity:   . Worried About Charity fundraiser in the Last Year: Not on file  . Ran Out of Food in the Last Year: Not on file  Transportation Needs:   . Lack of Transportation (Medical): Not on file  . Lack of Transportation (Non-Medical): Not on file  Physical Activity:   . Days of Exercise per Week: Not on file  . Minutes of Exercise per Session: Not on file  Stress:   . Feeling of Stress : Not on file  Social Connections:   .  Frequency of Communication with Friends and Family: Not on file  . Frequency of Social Gatherings with Friends and Family: Not on file  . Attends Religious Services: Not on file  . Active Member of Clubs or Organizations: Not on file  . Attends Archivist Meetings: Not on file  . Marital Status: Not on file  Intimate Partner Violence:   . Fear of Current or Ex-Partner: Not on file  . Emotionally Abused: Not on file  . Physically Abused: Not on file  . Sexually Abused: Not on file      PHYSICAL EXAM  Vitals:   03/10/19 1021  BP: 108/66  Pulse: 60  Temp: (!) 97.4 F (36.3 C)  SpO2: 98%  Weight: 142 lb (64.4 kg)  Height: 5' 4.5" (1.638 m)   Body mass index is 24 kg/m.  Generalized: Well developed, in no acute distress   Neurological examination  Mentation: Alert oriented to time, place, history taking. Follows all commands speech and language fluent Cranial nerve II-XII: Pupils were equal round reactive to light. Extraocular movements were full, visual field were full on confrontational test. . Head turning and shoulder shrug  were normal and symmetric. Motor: The motor testing reveals 5 over 5 strength of all 4 extremities. Good symmetric motor tone is noted throughout.  Sensory: Sensory testing is intact to soft touch on all 4 extremities. No evidence of extinction is noted.  Coordination: Cerebellar testing reveals good finger-nose-finger and heel-to-shin bilaterally.  Intention tremor noted in the upper extremities Gait and  station: Gait is normal.  Reflexes: Deep tendon reflexes are symmetric and normal bilaterally.   DIAGNOSTIC DATA (LABS, IMAGING, TESTING) - I reviewed patient records, labs, notes, testing and imaging myself where available.  Lab Results  Component Value Date   WBC 5.2 01/21/2019   HGB 13.3 01/21/2019   HCT 40.0 01/21/2019   MCV 97.5 01/21/2019   PLT 304.0 01/21/2019      Component Value Date/Time   NA 137 01/21/2019 1259   K 3.8 01/21/2019 1259   CL 102 01/21/2019 1259   CO2 30 01/21/2019 1259   GLUCOSE 111 (H) 01/21/2019 1259   BUN 19 01/21/2019 1259   CREATININE 0.68 01/21/2019 1259   CALCIUM 9.6 01/21/2019 1259   PROT 6.9 01/21/2019 1259   ALBUMIN 4.4 01/21/2019 1259   AST 14 01/21/2019 1259   ALT 22 01/21/2019 1259   ALKPHOS 78 01/21/2019 1259   BILITOT 0.4 01/21/2019 1259   Lab Results  Component Value Date   CHOL 291 (H) 01/21/2019   HDL 53.60 01/21/2019   LDLDIRECT 196.0 01/21/2019   TRIG 329.0 (H) 01/21/2019   CHOLHDL 5 01/21/2019    ASSESSMENT AND PLAN 54 y.o. year old female  has a past medical history of Allergy, Asthma, GERD (gastroesophageal reflux disease), Hyperlipidemia, and Smoking. here with :  1.  Essential tremor  -Continue primidone and propranolol  2.  Migraine headaches  -Currently managed by PCP -Recently started on nortriptyline by PCP -Did educate the patient on rebound headaches with using over-the-counter medication on a daily basis -Advised that if her headaches continued and she wanted a neurology evaluation she could asked her PCP to make this referral.  She voiced that right now she is content with her PCP managing this.   Advised if her symptoms worsen or she develops new symptoms she should let us know.  Follow-up in 6 months or sooner if needed  I spent 25 minutes of face-to-face and non-face-to-face time  with patient.  This included previsit chart review, order entry, electronic health record documentation, patient  education.  Ward Givens, MSN, NP-C 03/10/2019, 10:29 AM [facility] [address], [place], [place] [address] [phone]  I reviewed the above note and documentation by the Nurse Practitioner and agree with the history, exam, assessment and plan as outlined above. I was available for consultation. Star Age, MD, PhD [facility])

## 2019-03-14 ENCOUNTER — Other Ambulatory Visit: Payer: Self-pay | Admitting: Internal Medicine

## 2019-03-27 ENCOUNTER — Other Ambulatory Visit: Payer: Self-pay | Admitting: Internal Medicine

## 2019-04-09 ENCOUNTER — Other Ambulatory Visit: Payer: Self-pay | Admitting: Internal Medicine

## 2019-04-09 DIAGNOSIS — E7800 Pure hypercholesterolemia, unspecified: Secondary | ICD-10-CM

## 2019-04-09 DIAGNOSIS — E559 Vitamin D deficiency, unspecified: Secondary | ICD-10-CM

## 2019-04-16 ENCOUNTER — Other Ambulatory Visit: Admission: RE | Admit: 2019-04-16 | Payer: Managed Care, Other (non HMO) | Source: Ambulatory Visit

## 2019-04-18 ENCOUNTER — Ambulatory Visit
Admission: RE | Admit: 2019-04-18 | Payer: Managed Care, Other (non HMO) | Source: Home / Self Care | Admitting: Gastroenterology

## 2019-04-18 ENCOUNTER — Encounter: Admission: RE | Payer: Self-pay | Source: Home / Self Care

## 2019-04-18 ENCOUNTER — Telehealth: Payer: Self-pay

## 2019-04-18 SURGERY — COLONOSCOPY WITH PROPOFOL
Anesthesia: General

## 2019-04-18 NOTE — Telephone Encounter (Signed)
Patient LVM 04/17/19 for me to call her back to reschedule 04/18/19. This is due to her mother not doing well. Returned call to her LVM for her to call me back with a date that she would like to have her procedure rescheduled to.  Thanks,  [place], [place]

## 2019-04-25 ENCOUNTER — Other Ambulatory Visit: Payer: Self-pay

## 2019-04-25 ENCOUNTER — Other Ambulatory Visit (INDEPENDENT_AMBULATORY_CARE_PROVIDER_SITE_OTHER): Payer: Managed Care, Other (non HMO)

## 2019-04-25 ENCOUNTER — Encounter: Payer: Self-pay | Admitting: Internal Medicine

## 2019-04-25 DIAGNOSIS — E559 Vitamin D deficiency, unspecified: Secondary | ICD-10-CM

## 2019-04-25 DIAGNOSIS — E7800 Pure hypercholesterolemia, unspecified: Secondary | ICD-10-CM

## 2019-04-25 LAB — COMPREHENSIVE METABOLIC PANEL
ALT: 44 U/L — ABNORMAL HIGH (ref 0–35)
AST: 39 U/L — ABNORMAL HIGH (ref 0–37)
Albumin: 4.3 g/dL (ref 3.5–5.2)
Alkaline Phosphatase: 82 U/L (ref 39–117)
BUN: 16 mg/dL (ref 6–23)
CO2: 31 mEq/L (ref 19–32)
Calcium: 9.3 mg/dL (ref 8.4–10.5)
Chloride: 100 mEq/L (ref 96–112)
Creatinine, Ser: 0.76 mg/dL (ref 0.40–1.20)
GFR: 79.04 mL/min (ref >60.00)
Glucose, Bld: 97 mg/dL (ref 70–99)
Potassium: 5 mEq/L (ref 3.5–5.1)
Sodium: 137 mEq/L (ref 135–145)
Total Bilirubin: 0.4 mg/dL (ref 0.2–1.2)
Total Protein: 6.4 g/dL (ref 6.0–8.3)

## 2019-04-25 LAB — VITAMIN D 25 HYDROXY (VIT D DEFICIENCY, FRACTURES): VITD: 25.27 ng/mL — ABNORMAL LOW (ref 30.00–100.00)

## 2019-04-25 LAB — LIPID PANEL
Cholesterol: 233 mg/dL — ABNORMAL HIGH (ref 0–200)
HDL: 37.8 mg/dL — ABNORMAL LOW (ref >39.00)
Total CHOL/HDL Ratio: 6
Triglycerides: 791 mg/dL — ABNORMAL HIGH (ref 0.0–149.0)

## 2019-04-25 LAB — LDL CHOLESTEROL, DIRECT: Direct LDL: 91 mg/dL

## 2019-04-28 MED ORDER — FENOFIBRATE 54 MG PO TABS: 54.0000 mg | ORAL_TABLET | Freq: Every day | ORAL | 0 refills | Status: DC

## 2019-04-28 NOTE — Addendum Note (Signed)
Addended by: Lurlean Nanny on: 04/28/2019 05:14 PM   Modules accepted: Orders

## 2019-06-26 ENCOUNTER — Other Ambulatory Visit: Payer: Self-pay | Admitting: Internal Medicine

## 2019-06-27 ENCOUNTER — Telehealth: Payer: Self-pay

## 2019-06-27 NOTE — Telephone Encounter (Addendum)
Loma Sousa said pt has 7' appt at 2 PM on 06/30/19 scheduled as a my chart appt by pt. Changed to 30' appt. But I could not get in touch with pt and spoke with Jeneen Rinks (DPR signed)) Jeneen Rinks said to [phone];  that is the # I tried to call and Jeneen Rinks will get in touch with pt and have her call [facility]. Jeneen Rinks said pt had had some SOB but was not sure about any other symptoms. Pt said new onset SOB started on 06/24/19; pt also having some tightness in chest and inhaler helps a little bit. No CP, H/a or dizziness. Pt is not in distress but with new onset and pt wants face to face visit so someone can listen to chest. Pt will go to [facility] for eval and cancelled appt on 06/30/19 with Avie Echevaria NP. Juluis Rainier to Avie Echevaria NP. Pt does not have way to ck vitals.

## 2019-06-28 ENCOUNTER — Encounter: Payer: Self-pay | Admitting: Emergency Medicine

## 2019-06-28 ENCOUNTER — Emergency Department
Admission: EM | Admit: 2019-06-28 | Discharge: 2019-06-28 | Disposition: A | Discharge status: Home / Self Care | Payer: Managed Care, Other (non HMO) | Attending: Emergency Medicine | Admitting: Emergency Medicine

## 2019-06-28 ENCOUNTER — Emergency Department: Payer: Managed Care, Other (non HMO)

## 2019-06-28 ENCOUNTER — Other Ambulatory Visit: Payer: Self-pay

## 2019-06-28 DIAGNOSIS — J302 Other seasonal allergic rhinitis: Secondary | ICD-10-CM | POA: Diagnosis not present

## 2019-06-28 DIAGNOSIS — F17210 Nicotine dependence, cigarettes, uncomplicated: Secondary | ICD-10-CM | POA: Diagnosis not present

## 2019-06-28 DIAGNOSIS — Z91040 Latex allergy status: Secondary | ICD-10-CM | POA: Insufficient documentation

## 2019-06-28 DIAGNOSIS — R0602 Shortness of breath: Secondary | ICD-10-CM | POA: Insufficient documentation

## 2019-06-28 DIAGNOSIS — Z20822 Contact with and (suspected) exposure to covid-19: Secondary | ICD-10-CM | POA: Diagnosis not present

## 2019-06-28 LAB — CBC
HCT: 39.2 % (ref 36.0–46.0)
Hemoglobin: 13.4 g/dL (ref 12.0–15.0)
MCH: 32.5 pg (ref 26.0–34.0)
MCHC: 34.2 g/dL (ref 30.0–36.0)
MCV: 95.1 fL (ref 80.0–100.0)
Platelets: 324 10*3/uL (ref 150–400)
RBC: 4.12 MIL/uL (ref 3.87–5.11)
RDW: 12.6 % (ref 11.5–15.5)
WBC: 7.1 10*3/uL (ref 4.0–10.5)
nRBC: 0 % (ref 0.0–0.2)

## 2019-06-28 LAB — BASIC METABOLIC PANEL
Anion gap: 10 (ref 5–15)
BUN: 17 mg/dL (ref 6–20)
CO2: 24 mmol/L (ref 22–32)
Calcium: 9.5 mg/dL (ref 8.9–10.3)
Chloride: 103 mmol/L (ref 98–111)
Creatinine, Ser: 0.61 mg/dL (ref 0.44–1.00)
GFR calc Af Amer: >60 mL/min (ref >60)
GFR calc non Af Amer: >60 mL/min (ref >60)
Glucose, Bld: 114 mg/dL — ABNORMAL HIGH (ref 70–99)
Potassium: 4.1 mmol/L (ref 3.5–5.1)
Sodium: 137 mmol/L (ref 135–145)

## 2019-06-28 LAB — RESP PANEL BY RT PCR (RSV, FLU A&B, COVID)
Influenza A by PCR: NEGATIVE
Influenza B by PCR: NEGATIVE
Respiratory Syncytial Virus by PCR: NEGATIVE
SARS Coronavirus 2 by RT PCR: NEGATIVE

## 2019-06-28 LAB — TROPONIN I (HIGH SENSITIVITY): Troponin I (High Sensitivity): 5 ng/L (ref <18)

## 2019-06-28 IMAGING — CR DG CHEST 2V
2 series · 2 of 2 positions shown · non-contrast
Comparison: None.

CLINICAL DATA: Shortness of breath.

EXAM:
CHEST - 2 VIEW

[chest pa]
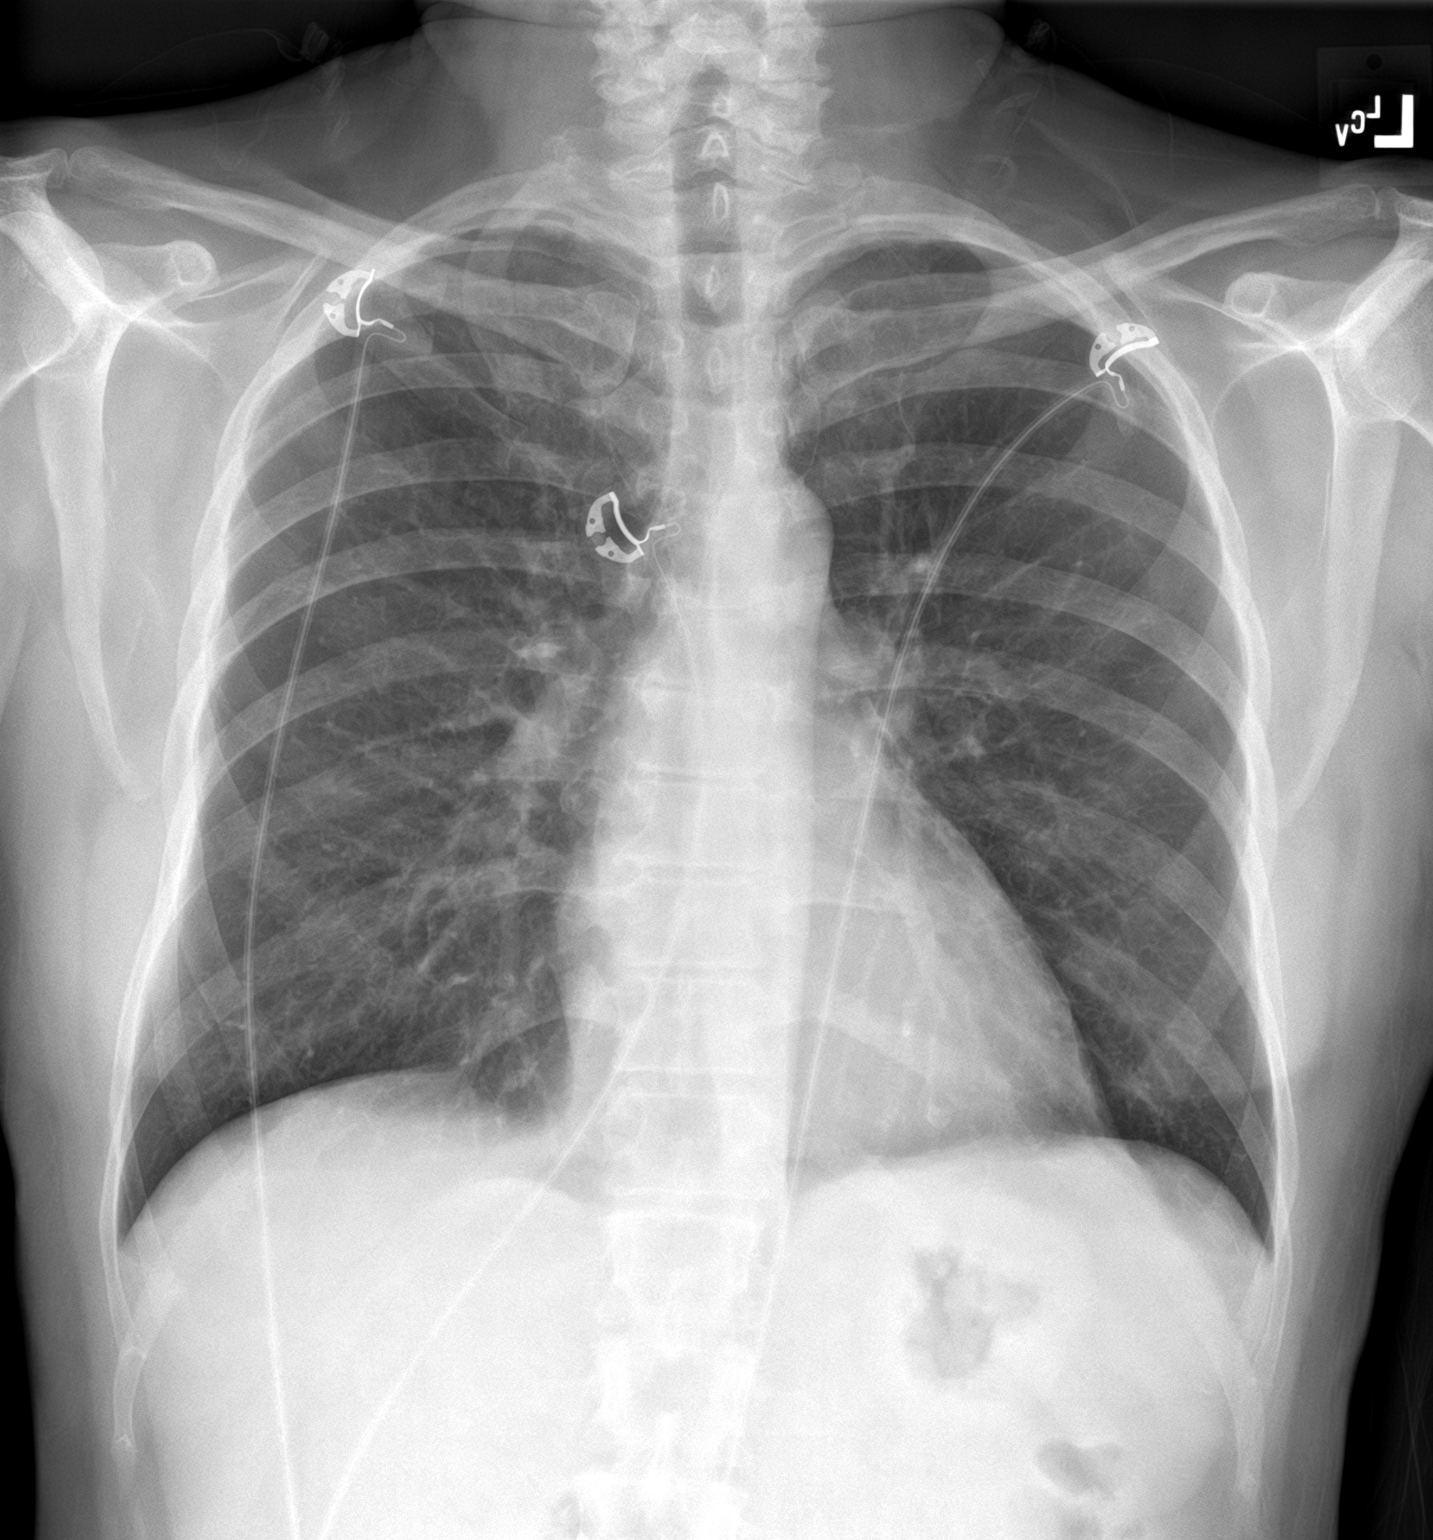

[chest lat]
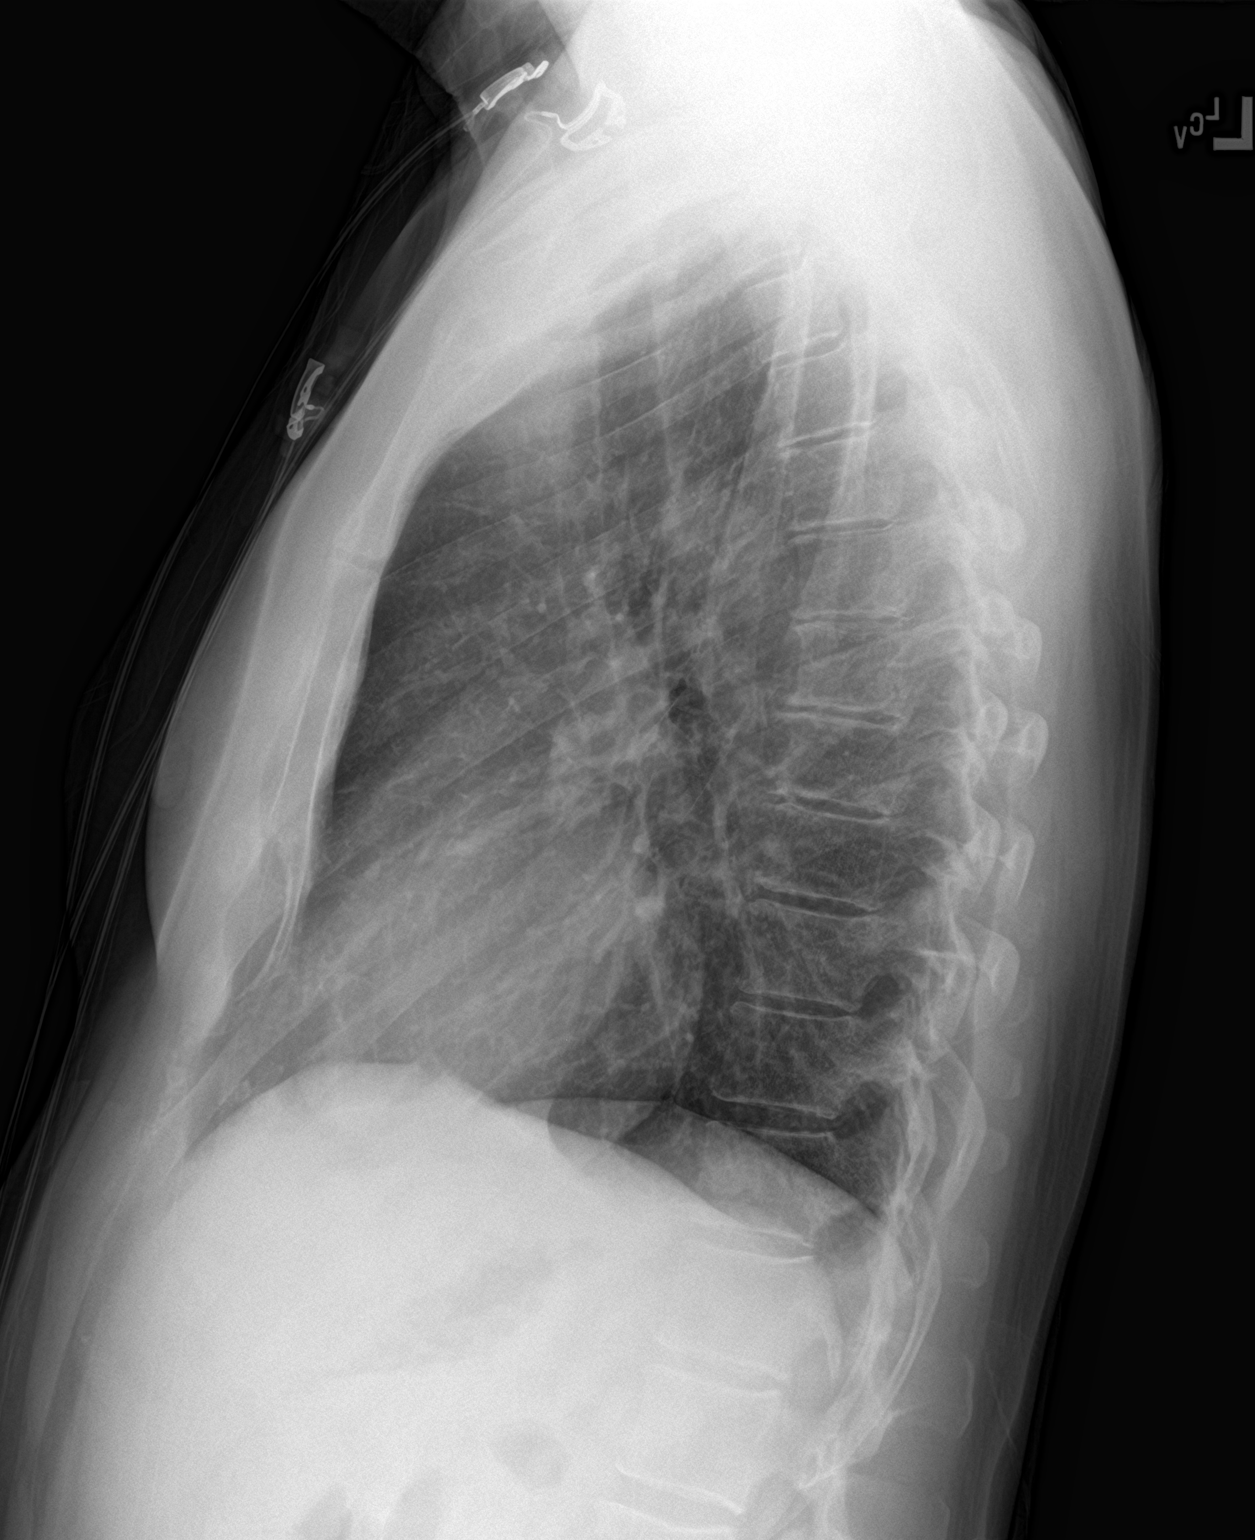

[2 of 2 positions shown; findings below may reference images not displayed]

FINDINGS: The heart size and mediastinal contours are within normal limits.
Both lungs are clear. The visualized skeletal structures are
unremarkable.
IMPRESSION: No active cardiopulmonary disease.

## 2019-06-28 MED ORDER — PREDNISONE 10 MG (21) PO TBPK
ORAL_TABLET | ORAL | 0 refills | Status: DC
Start: 2019-06-28 — End: 2019-07-10

## 2019-06-28 MED ORDER — METHYLPREDNISOLONE SODIUM SUCC 125 MG IJ SOLR
125.0000 mg | Freq: Once | INTRAMUSCULAR | Status: AC
  Administered 2019-06-28: 125 mg via INTRAVENOUS
  Filled 2019-06-28: qty 2

## 2019-06-28 MED ORDER — CETIRIZINE HCL 10 MG PO TABS
10.0000 mg | ORAL_TABLET | Freq: Every day | ORAL | 2 refills | Status: DC
Start: 2019-06-28 — End: 2020-07-12

## 2019-06-28 MED ORDER — ATORVASTATIN CALCIUM 10 MG PO TABS: 10.0000 mg | ORAL_TABLET | Freq: Every day | ORAL | 0 refills | Status: DC

## 2019-06-28 MED ORDER — FENOFIBRATE 54 MG PO TABS: 54.0000 mg | ORAL_TABLET | Freq: Every day | ORAL | 0 refills | Status: DC

## 2019-06-28 NOTE — ED Notes (Signed)
Reviewed discharge instructions, follow-up care, and prescriptions with patient. Patient verbalized understanding of all information reviewed. Patient stable, with no distress noted at this time.    

## 2019-06-28 NOTE — ED Triage Notes (Signed)
Here for [facility] for 3 days.  Denies chest pain at this time.  EKG changes present.  No travel or hormones.  + smoker.

## 2019-06-28 NOTE — Telephone Encounter (Signed)
noted 

## 2019-06-28 NOTE — Discharge Instructions (Signed)
Please continue using your inhaler.  Follow-up with your primary care provider early next week especially if you are not improving with medications.  If you develop any chest pain whatsoever, please return to the emergency department.

## 2019-06-28 NOTE — ED Provider Notes (Signed)
[facility] Emergency Department Provider Note  ____________________________________________   First MD Initiated Contact with Patient 06/28/19 1712     (approximate)  I have reviewed the triage vital signs and the nursing notes.   HISTORY  Chief Complaint Shortness of Breath   HPI Kristie Miles is a 54 y.o. female presents to the emergency department for treatment and evaluation of shortness of breath.  Symptoms started about 5 days ago. No chest pain.  She smokes cigarettes but is trying to quit.  No history of PE or DVT.  No hormone treatment or recent immobilization or long distance traveling.  She does have a history of asthma for which she uses an inhaler occasionally.  She also has history of seasonal allergies which she says have been worse lately.  She is not currently on any allergy medication.  Past Medical History:  Diagnosis Date  . Allergy   . Asthma   . GERD (gastroesophageal reflux disease)   . Hyperlipidemia   . Smoking     Patient Active Problem List   Diagnosis Date Noted  . Essential tremor 01/21/2019  . HLD (hyperlipidemia) 12/10/2017  . Asthma 12/10/2017  . Mood disorder ([place]) 12/10/2017  . Herpes simplex 11/28/2017  . GERD (gastroesophageal reflux disease) 04/19/2017  . Smoking 04/19/2017  . Headache disorder 07/26/2016  . History of CVA (cerebrovascular accident) 07/26/2016    Past Surgical History:  Procedure Laterality Date  . FOOT SURGERY Bilateral     Prior to Admission medications   Medication Sig Start Date End Date Taking? Authorizing Provider  atorvastatin (LIPITOR) 10 MG tablet Take 1 tablet (10 mg total) by mouth daily. 06/28/19   Jearld Fenton, NP  butalbital-acetaminophen-caffeine (FIORICET) [phone] MG tablet Take 1-2 tablets by mouth every 6 (six) hours as needed for headache. 03/05/19 03/04/20  Jearld Fenton, NP  cetirizine (ZYRTEC ALLERGY) 10 MG tablet Take 1 tablet (10 mg total) by mouth daily. 06/28/19  06/27/20  Mariesa Grieder, Johnette Abraham B, FNP  fenofibrate 54 MG tablet Take 1 tablet (54 mg total) by mouth daily. 06/28/19   Jearld Fenton, NP  Flaxseed, Linseed, (FLAX SEEDS PO) Take by mouth.    [provider]  fluticasone (FLONASE) 50 MCG/ACT nasal spray Place into both nostrils daily.    [provider]  meloxicam (MOBIC) 15 MG tablet TAKE 1 TABLET BY MOUTH EVERY DAY 09/24/18   Edrick Kins, DPM  nortriptyline (PAMELOR) 10 MG capsule TAKE 1 CAPSULE (10 MG TOTAL) BY MOUTH AT BEDTIME. 03/27/19   Jearld Fenton, NP  Nutritional Supplements (ESTROVEN PO) Take by mouth.    [provider]  ondansetron (ZOFRAN) 4 MG tablet Take 1 tablet (4 mg total) by mouth every 8 (eight) hours as needed. 02/13/19   Jearld Fenton, NP  PARoxetine (PAXIL) 40 MG tablet TAKE 1 TABLET(40 MG) BY MOUTH EVERY MORNING 03/14/19   Baity, Coralie Keens, NP  predniSONE (STERAPRED UNI-PAK 21 TAB) 10 MG (21) TBPK tablet Take 6 tablets on the first day and decrease by 1 tablet each day until finished. 06/28/19   Dinna Severs, Johnette Abraham B, FNP  primidone (MYSOLINE) 50 MG tablet TAKE 2 TABLETS BY MOUTH AT  BEDTIME 07/30/18   Star Age, MD  propranolol ER (INDERAL LA) 160 MG SR capsule TAKE 1 CAPSULE BY MOUTH  DAILY 01/28/19   Star Age, MD  Rhubarb (ESTROVEN MENOPAUSE RELIEF) 4 MG TABS Take by mouth.    [provider]  triamcinolone cream (KENALOG) 0.1 %  Apply 1 application topically 2 (two) times daily. 01/21/19   Jearld Fenton, NP  valACYclovir (VALTREX) 500 MG tablet Take 1 tablet (500 mg total) by mouth 2 (two) times daily as needed. SCHEDULE PHYSICAL 07/06/18   Jearld Fenton, NP  VENTOLIN HFA 108 (90 Base) MCG/ACT inhaler TAKE 2 PUFFS BY MOUTH EVERY 6 HOURS AS NEEDED FOR WHEEZE OR SHORTNESS OF BREATH 04/05/18   Tonia Ghent, MD  Vitamin D, Ergocalciferol, (DRISDOL) 1.25 MG (50000 UNIT) CAPS capsule Take 1 capsule (50,000 Units total) by mouth every 7 (seven) days. 01/24/19   Jearld Fenton, NP    Allergies  Other, Latex, and Sulfa antibiotics  Family History  Problem Relation Age of Onset  . Arthritis Mother   . Hyperlipidemia Mother   . Hypertension Mother   . Asthma Father   . Kidney cancer Father   . Kidney disease Father   . Alcohol abuse Maternal Grandmother   . Arthritis Maternal Grandmother   . Heart disease Maternal Grandfather   . Arthritis Paternal Grandmother   . Asthma Paternal Grandmother     Social History Social History   Tobacco Use  . Smoking status: Current Every Day Smoker    Packs/day: 0.50    Types: Cigarettes  . Smokeless tobacco: Never Used  . Tobacco comment: less then 1/2 a pack a day.  prev able to tolerate chantix  Substance Use Topics  . Alcohol use: Yes    Comment: social drinker  . Drug use: No    Review of Systems  Constitutional: No fever/chills. Eyes: No visual changes. ENT: No sore throat. Cardiovascular: Negative for chest pain. Negative for pleuritic pain. Negative for palpitations. Negative for leg pain. Respiratory: Positive for shortness of breath. Gastrointestinal: Negative for abdominal pain. No nausea, no vomiting.  No diarrhea.  No constipation. Genitourinary: Negative for dysuria. Musculoskeletal: Negative for back pain.  Skin: Negative for rash, lesion, wound. Neurological: Negative for headaches, focal weakness or numbness. ____________________________________________   PHYSICAL EXAM:  VITAL SIGNS: ED Triage Vitals  Enc Vitals Group     BP 06/28/19 1652 (!) 166/90     Pulse Rate 06/28/19 1652 71     Resp 06/28/19 1652 (!) 24     Temp 06/28/19 1652 98.3 F (36.8 C)     Temp Source 06/28/19 1652 Oral     SpO2 06/28/19 1652 99 %     Weight 06/28/19 1651 141 lb (64 kg)     Height 06/28/19 1651 5\' 4"  (1.626 m)     Head Circumference --      Peak Flow --      Pain Score 06/28/19 1651 0     Pain Loc --      Pain Edu? --      Excl. in [place]? --     Constitutional: Alert and oriented.  Well appearing and in no acute  distress.  Normal mental status. Eyes: Conjunctivae are normal. PERRL. Head: Atraumatic. Nose: No congestion/rhinnorhea. Mouth/Throat: Mucous membranes are moist.  Oropharynx non-erythematous. Tongue normal in size and color. Neck: No stridor.  No carotid bruit appreciated on exam. Hematological/Lymphatic/Immunilogical: No cervical lymphadenopathy. Cardiovascular: Normal rate, regular rhythm. Grossly normal heart sounds.  Good peripheral circulation. Respiratory: Normal respiratory effort.  No retractions. Lungs CTAB. Gastrointestinal: Soft and nontender. No distention. No abdominal bruits. No CVA tenderness. Genitourinary: Exam deferred. Musculoskeletal: No lower extremity tenderness.  No edema of extremities. Neurologic:  Normal speech and language. No gross focal neurologic deficits are appreciated.  Skin:  Skin is warm, dry and intact. No rash noted. Psychiatric: Mood and affect are normal. Speech and behavior are normal.  ____________________________________________   LABS (all labs ordered are listed, but only abnormal results are displayed)  Labs Reviewed  BASIC METABOLIC PANEL - Abnormal; Notable for the following components:      Result Value   Glucose, Bld 114 (*)    All other components within normal limits  RESP PANEL BY RT PCR (RSV, FLU A&B, COVID)  CBC  TROPONIN I (HIGH SENSITIVITY)   ____________________________________________  EKG  ED ECG REPORT I, Jamonta Goerner, FNP-BC personally viewed and interpreted this ECG.   Date: 06/28/2019  EKG Time: 1645  Rate: 72  Rhythm: normal sinus rhythm, nonspecific ST and T waves changes  Axis: normal  Intervals:none  ST&T Change: inverted T waves in V5 and V6  ____________________________________________  RADIOLOGY  ED MD interpretation: No acute cardiopulmonary abnormality per radiology.  I, Sherrie George, personally viewed and evaluated these images (plain radiographs) as part of my medical decision making, as  well as reviewing the written report by the radiologist.  Official radiology report(s): DG Chest 2 View  Result Date: 06/28/2019 CLINICAL DATA:  Shortness of breath. EXAM: CHEST - 2 VIEW COMPARISON:  None. FINDINGS: The heart size and mediastinal contours are within normal limits. Both lungs are clear. The visualized skeletal structures are unremarkable. IMPRESSION: No active cardiopulmonary disease. Electronically Signed   By: Constance Holster M.D.   On: 06/28/2019 17:44    ____________________________________________   PROCEDURES  Procedure(s) performed: None  Procedures  Critical Care performed: No  ____________________________________________   INITIAL IMPRESSION / ASSESSMENT AND PLAN / ED COURSE  54 year old female presenting to the emergency department for treatment and evaluation of several days of shortness of breath.  She has used her inhaler intermittently without much improvement.  No chest pain.  Plan will be to give her some Solu-Medrol and review her lab results, chest x-ray, and EKG.  Initial troponin is 5.  Chest x-ray is negative for any acute findings.  CBC and BMP are all normal.   Improvement after Solu-Medrol.  Patient's not tachycardic, oxygen saturation is 95% and above.  No tachypnea.  Blood pressure is normal.  She will be discharged home with a prescription for prednisone and Zyrtec.  She will continue to use her inhaler as prescribed.  She will call and schedule a follow-up appointment with her primary care provider next week.  She was advised that if she develops any chest pain or if the shortness of breath becomes worse, she is to return to the emergency department. ____________________________________________  Differential diagnosis includes, but not limited to:     FINAL CLINICAL IMPRESSION(S) / ED DIAGNOSES  Final diagnoses:  Shortness of breath  Seasonal allergies  Cigarette smoker motivated to quit     ED Discharge Orders          Ordered    predniSONE (STERAPRED UNI-PAK 21 TAB) 10 MG (21) TBPK tablet     Discontinue  Reprint     06/28/19 1934    cetirizine (ZYRTEC ALLERGY) 10 MG tablet  Daily     Discontinue  Reprint     06/28/19 1934           Kristie Miles was evaluated in Emergency Department on 06/28/2019 for the symptoms described in the history of present illness. She was evaluated in the context of the global COVID-19 pandemic, which necessitated consideration that the patient might be at  risk for infection with the SARS-CoV-2 virus that causes COVID-19. Institutional protocols and algorithms that pertain to the evaluation of patients at risk for COVID-19 are in a state of rapid change based on information released by regulatory bodies including the CDC and federal and state organizations. These policies and algorithms were followed during the patient's care in the ED.   Note:  This document was prepared using Dragon voice recognition software and may include unintentional dictation errors.   Victorino Dike, FNP 06/28/19 2013    Earleen Newport, MD 06/28/19 2020

## 2019-06-28 NOTE — ED Triage Notes (Signed)
First nurse note- [facility] for few days. Sent for EKG changes. Pulled for EKG

## 2019-06-30 ENCOUNTER — Ambulatory Visit: Payer: Managed Care, Other (non HMO) | Admitting: Internal Medicine

## 2019-07-04 ENCOUNTER — Ambulatory Visit: Payer: Managed Care, Other (non HMO) | Admitting: Internal Medicine

## 2019-07-09 ENCOUNTER — Other Ambulatory Visit: Payer: Self-pay

## 2019-07-10 ENCOUNTER — Ambulatory Visit: Payer: Managed Care, Other (non HMO) | Admitting: Internal Medicine

## 2019-07-10 ENCOUNTER — Other Ambulatory Visit: Payer: Self-pay

## 2019-07-10 ENCOUNTER — Encounter: Payer: Self-pay | Admitting: Internal Medicine

## 2019-07-10 VITALS — BP 116/72 | HR 68 | Temp 97.6°F | Wt 143.0 lb

## 2019-07-10 DIAGNOSIS — F17200 Nicotine dependence, unspecified, uncomplicated: Secondary | ICD-10-CM

## 2019-07-10 DIAGNOSIS — R0602 Shortness of breath: Secondary | ICD-10-CM | POA: Diagnosis not present

## 2019-07-10 DIAGNOSIS — K219 Gastro-esophageal reflux disease without esophagitis: Secondary | ICD-10-CM | POA: Diagnosis not present

## 2019-07-10 MED ORDER — ONDANSETRON HCL 4 MG PO TABS: 4.0000 mg | ORAL_TABLET | Freq: Three times a day (TID) | ORAL | 0 refills | Status: DC | PRN

## 2019-07-10 MED ORDER — CHANTIX STARTING MONTH PAK 0.5 MG X 11 & 1 MG X 42 PO TABS: ORAL_TABLET | ORAL | 0 refills | Status: DC

## 2019-07-10 MED ORDER — PANTOPRAZOLE SODIUM 40 MG PO TBEC
40.0000 mg | DELAYED_RELEASE_TABLET | Freq: Every day | ORAL | 1 refills | Status: DC
Start: 2019-07-10 — End: 2019-12-04

## 2019-07-10 NOTE — Patient Instructions (Signed)
Coping with Quitting Smoking  Quitting smoking is a physical and mental challenge. You will face cravings, withdrawal symptoms, and temptation. Before quitting, work with your health care provider to make a plan that can help you cope. Preparation can help you quit and keep you from giving in. How can I cope with cravings? Cravings usually last for 5-10 minutes. If you get through it, the craving will pass. Consider taking the following actions to help you cope with cravings:  Keep your mouth busy: ? Chew sugar-free gum. ? Suck on hard candies or a straw. ? Brush your teeth.  Keep your hands and body busy: ? Immediately change to a different activity when you feel a craving. ? Squeeze or play with a ball. ? Do an activity or a hobby, like making bead jewelry, practicing needlepoint, or working with wood. ? Mix up your normal routine. ? Take a short exercise break. Go for a quick walk or run up and down stairs. ? Spend time in public places where smoking is not allowed.  Focus on doing something kind or helpful for someone else.  Call a friend or family member to talk during a craving.  Join a support group.  Call a quit line, such as 1-800-QUIT-NOW.  Talk with your health care provider about medicines that might help you cope with cravings and make quitting easier for you. How can I deal with withdrawal symptoms? Your body may experience negative effects as it tries to get used to not having nicotine in the system. These effects are called withdrawal symptoms. They may include:  Feeling hungrier than normal.  Trouble concentrating.  Irritability.  Trouble sleeping.  Feeling depressed.  Restlessness and agitation.  Craving a cigarette. To manage withdrawal symptoms:  Avoid places, people, and activities that trigger your cravings.  Remember why you want to quit.  Get plenty of sleep.  Avoid coffee and other caffeinated drinks. These may worsen some of your  symptoms. How can I handle social situations? Social situations can be difficult when you are quitting smoking, especially in the first few weeks. To manage this, you can:  Avoid parties, bars, and other social situations where people might be smoking.  Avoid alcohol.  Leave right away if you have the urge to smoke.  Explain to your family and friends that you are quitting smoking. Ask for understanding and support.  Plan activities with friends or family where smoking is not an option. What are some ways I can cope with stress? Wanting to smoke may cause stress, and stress can make you want to smoke. Find ways to manage your stress. Relaxation techniques can help. For example:  Breathe slowly and deeply, in through your nose and out through your mouth.  Listen to soothing, relaxing music.  Talk with a family member or friend about your stress.  Light a candle.  Soak in a bath or take a shower.  Think about a peaceful place. What are some ways I can prevent weight gain? Be aware that many people gain weight after they quit smoking. However, not everyone does. To keep from gaining weight, have a plan in place before you quit and stick to the plan after you quit. Your plan should include:  Having healthy snacks. When you have a craving, it may help to: ? Eat plain popcorn, crunchy carrots, celery, or other cut vegetables. ? Chew sugar-free gum.  Changing how you eat: ? Eat small portion sizes at meals. ? Eat 4-6 small meals   throughout the day instead of 1-2 large meals a day. ? Be mindful when you eat. Do not watch television or do other things that might distract you as you eat.  Exercising regularly: ? Make time to exercise each day. If you do not have time for a long workout, do short bouts of exercise for 5-10 minutes several times a day. ? Do some form of strengthening exercise, like weight lifting, and some form of aerobic exercise, like running or swimming.  Drinking  plenty of water or other low-calorie or no-calorie drinks. Drink 6-8 glasses of water daily, or as much as instructed by your health care provider. Summary  Quitting smoking is a physical and mental challenge. You will face cravings, withdrawal symptoms, and temptation to smoke again. Preparation can help you as you go through these challenges.  You can cope with cravings by keeping your mouth busy (such as by chewing gum), keeping your body and hands busy, and making calls to family, friends, or a helpline for people who want to quit smoking.  You can cope with withdrawal symptoms by avoiding places where people smoke, avoiding drinks with caffeine, and getting plenty of rest.  Ask your health care provider about the different ways to prevent weight gain, avoid stress, and handle social situations. This information is not intended to replace advice given to you by your health care provider. Make sure you discuss any questions you have with your health care provider. Document Revised: 12/01/2016 Document Reviewed: 12/17/2015 Elsevier Patient Education  2020 Elsevier Inc.  

## 2019-07-10 NOTE — Progress Notes (Signed)
Subjective:    Patient ID: Kristie Miles, female    DOB: 02/04/1965, 54 y.o.   MRN: [id number]  HPI  Patient presents the clinic today for ER follow-up.  She went to the ER 6/26 with 5-day history of shortness of breath.  Labs were unremarkable.  Chest x-ray was negative.  She was treated with Solu-Medrol and given prescriptions for Prednisone and Zyrtec to take in addition to her Albuterol.  Since discharge, she still has some SOB but it is much better. It occurs mostly with exertion but can occur at rest. She denies chest pain.  She is requesting possible referral to pulmonology and cardiology for further evaluation of her symptoms.  She is a current smoker. She is interested in quitting smoking. She is currently smoking 1/2 ppd for 39 years. She has tried Chantix in the past, reports this caused some nausea. She would like to try Chantix again if she could also get something for the nausea.  She also reports her Esomeprazole is not covered by her insurance.  Review of Systems      Past Medical History:  Diagnosis Date  . Allergy   . Asthma   . GERD (gastroesophageal reflux disease)   . Hyperlipidemia   . Smoking     Current Outpatient Medications  Medication Sig Dispense Refill  . atorvastatin (LIPITOR) 10 MG tablet Take 1 tablet (10 mg total) by mouth daily. 90 tablet 0  . butalbital-acetaminophen-caffeine (FIORICET) 50-325-40 MG tablet Take 1-2 tablets by mouth every 6 (six) hours as needed for headache. 20 tablet 2  . cetirizine (ZYRTEC ALLERGY) 10 MG tablet Take 1 tablet (10 mg total) by mouth daily. 30 tablet 2  . fenofibrate 54 MG tablet Take 1 tablet (54 mg total) by mouth daily. 90 tablet 0  . Flaxseed, Linseed, (FLAX SEEDS PO) Take by mouth.    . fluticasone (FLONASE) 50 MCG/ACT nasal spray Place into both nostrils daily.    . meloxicam (MOBIC) 15 MG tablet TAKE 1 TABLET BY MOUTH EVERY DAY 30 tablet 3  . nortriptyline (PAMELOR) 10 MG capsule TAKE 1 CAPSULE (10 MG TOTAL)  BY MOUTH AT BEDTIME. 90 capsule 0  . Nutritional Supplements (ESTROVEN PO) Take by mouth.    . ondansetron (ZOFRAN) 4 MG tablet Take 1 tablet (4 mg total) by mouth every 8 (eight) hours as needed. 20 tablet 2  . PARoxetine (PAXIL) 40 MG tablet TAKE 1 TABLET(40 MG) BY MOUTH EVERY MORNING 90 tablet 2  . predniSONE (STERAPRED UNI-PAK 21 TAB) 10 MG (21) TBPK tablet Take 6 tablets on the first day and decrease by 1 tablet each day until finished. 21 tablet 0  . primidone (MYSOLINE) 50 MG tablet TAKE 2 TABLETS BY MOUTH AT  BEDTIME 180 tablet 3  . propranolol ER (INDERAL LA) 160 MG SR capsule TAKE 1 CAPSULE BY MOUTH  DAILY 90 capsule 3  . Rhubarb (ESTROVEN MENOPAUSE RELIEF) 4 MG TABS Take by mouth.    . triamcinolone cream (KENALOG) 0.1 % Apply 1 application topically 2 (two) times daily. 453.6 g 1  . valACYclovir (VALTREX) 500 MG tablet Take 1 tablet (500 mg total) by mouth 2 (two) times daily as needed. SCHEDULE PHYSICAL 90 tablet 0  . VENTOLIN HFA 108 (90 Base) MCG/ACT inhaler TAKE 2 PUFFS BY MOUTH EVERY 6 HOURS AS NEEDED FOR WHEEZE OR SHORTNESS OF BREATH 18 Inhaler 0  . Vitamin D, Ergocalciferol, (DRISDOL) 1.25 MG (50000 UNIT) CAPS capsule Take 1 capsule (50,000 Units total) by  mouth every 7 (seven) days. 12 capsule 0   No current facility-administered medications for this visit.    Allergies  Allergen Reactions  . Other     Other reaction(s): rash/itching  . Latex Itching  . Sulfa Antibiotics Rash    Family History  Problem Relation Age of Onset  . Arthritis Mother   . Hyperlipidemia Mother   . Hypertension Mother   . Asthma Father   . Kidney cancer Father   . Kidney disease Father   . Alcohol abuse Maternal Grandmother   . Arthritis Maternal Grandmother   . Heart disease Maternal Grandfather   . Arthritis Paternal Grandmother   . Asthma Paternal Grandmother     Social History   Socioeconomic History  . Marital status: Married    Spouse name: Not on file  . Number of  children: Not on file  . Years of education: Not on file  . Highest education level: Not on file  Occupational History  . Not on file  Tobacco Use  . Smoking status: Current Every Day Smoker    Packs/day: 0.50    Types: Cigarettes  . Smokeless tobacco: Never Used  . Tobacco comment: less then 1/2 a pack a day.  prev able to tolerate chantix  Substance and Sexual Activity  . Alcohol use: Yes    Comment: social drinker  . Drug use: No  . Sexual activity: Not on file  Other Topics Concern  . Not on file  Social History Narrative  . Not on file   Social Determinants of Health   Financial Resource Strain:   . Difficulty of Paying Living Expenses:   Food Insecurity:   . Worried About Charity fundraiser in the Last Year:   . Arboriculturist in the Last Year:   Transportation Needs:   . Film/video editor (Medical):   Marland Kitchen Lack of Transportation (Non-Medical):   Physical Activity:   . Days of Exercise per Week:   . Minutes of Exercise per Session:   Stress:   . Feeling of Stress :   Social Connections:   . Frequency of Communication with Friends and Family:   . Frequency of Social Gatherings with Friends and Family:   . Attends Religious Services:   . Active Member of Clubs or Organizations:   . Attends Archivist Meetings:   Marland Kitchen Marital Status:   Intimate Partner Violence:   . Fear of Current or Ex-Partner:   . Emotionally Abused:   Marland Kitchen Physically Abused:   . Sexually Abused:      Constitutional: Denies fever, malaise, fatigue, headache or abrupt weight changes.  HEENT: Denies eye pain, eye redness, ear pain, ringing in the ears, wax buildup, runny nose, nasal congestion, bloody nose, or sore throat. Respiratory: Pt reports shortness of breath. Denies difficulty breathing, cough or sputum production.   Cardiovascular: Denies chest pain, chest tightness, palpitations or swelling in the hands or feet.  Gastrointestinal: Pt reports intermittent reflux. Denies  abdominal pain, bloating, constipation, diarrhea or blood in the stool.   No other specific complaints in a complete review of systems (except as listed in HPI above).  Objective:   Physical Exam  BP 116/72   Pulse 68   Temp 97.6 F (36.4 C) (Temporal)   Wt 143 lb (64.9 kg)   SpO2 98%   BMI 24.55 kg/m   Wt Readings from Last 3 Encounters:  06/28/19 141 lb (64 kg)  03/10/19 142 lb (64.4 kg)  02/13/19 141 lb (64 kg)    General: Appears her stated age, well developed, well nourished in NAD. Skin: Warm, dry and intact. No rashesnoted. HEENT: Head: normal shape and size; Eyes: sclera white, no icterus, conjunctiva pink, PERRLA and EOMs intact; Neck:  Neck supple, trachea midline. No masses, lumps or thyromegaly present.  Cardiovascular: Normal rate and rhythm. S1,S2 noted.  No murmur, rubs or gallops noted.  Pulmonary/Chest: Normal effort and positive vesicular breath sounds. No respiratory distress. No wheezes, rales or ronchi noted.  Neurological: Alert and oriented.    BMET    Component Value Date/Time   NA 137 06/28/2019 1710   K 4.1 06/28/2019 1710   CL 103 06/28/2019 1710   CO2 24 06/28/2019 1710   GLUCOSE 114 (H) 06/28/2019 1710   BUN 17 06/28/2019 1710   CREATININE 0.61 06/28/2019 1710   CALCIUM 9.5 06/28/2019 1710   GFRNONAA >60 06/28/2019 1710   GFRAA >60 06/28/2019 1710    Lipid Panel     Component Value Date/Time   CHOL 233 (H) 04/25/2019 0859   TRIG (H) 04/25/2019 0859    791.0 Triglyceride is over 400; calculations on Lipids are invalid.   HDL 37.80 (L) 04/25/2019 0859   CHOLHDL 6 04/25/2019 0859   VLDL 65.8 (H) 01/21/2019 1259    CBC    Component Value Date/Time   WBC 7.1 06/28/2019 1710   RBC 4.12 06/28/2019 1710   HGB 13.4 06/28/2019 1710   HCT 39.2 06/28/2019 1710   PLT 324 06/28/2019 1710   MCV 95.1 06/28/2019 1710   MCH 32.5 06/28/2019 1710   MCHC 34.2 06/28/2019 1710   RDW 12.6 06/28/2019 1710    Hgb A1C No results found for:  HGBA1C          Assessment & Plan:   ER follow-up for Shortness of Breath, Smoker:  ER notes, labs and imaging reviewed RX for Chantix and Zofran Continue Zyrtec and Albuterol Will hold off on referral to pulm/cards unless symptoms persist/worsen despite smoking cessation  GERD:  RX for Pantoprazole 40 mg PO daily.  Return precautions discussed Webb Silversmith, NP This visit occurred during the SARS-CoV-2 public health emergency.  Safety protocols were in place, including screening questions prior to the visit, additional usage of staff PPE, and extensive cleaning of exam room while observing appropriate contact time as indicated for disinfecting solutions.

## 2019-08-20 ENCOUNTER — Other Ambulatory Visit: Payer: Self-pay | Admitting: Internal Medicine

## 2019-08-22 NOTE — Telephone Encounter (Signed)
Last filled 07/10/2019 at hosp f/u visit... please advise if appropriate to refill

## 2019-09-11 ENCOUNTER — Other Ambulatory Visit: Payer: Self-pay | Admitting: Internal Medicine

## 2019-09-11 NOTE — Telephone Encounter (Signed)
Pt called stating she is out of her meds and needs refill.  She is having migrane now.  Best number [phone]

## 2019-09-11 NOTE — Telephone Encounter (Signed)
Please address in Regina's absence.  Name of Medication: Wiggins Name of Pharmacy: [facility] Last Fill or Written Date and Quantity: 03/11/19, #20 Last Office Visit and Type: 07/10/19, SOB and GERD Next Office Visit and Type: none Last Controlled Substance Agreement Date: none Last UDS: none

## 2019-09-12 ENCOUNTER — Other Ambulatory Visit: Payer: Self-pay | Admitting: Internal Medicine

## 2019-09-23 ENCOUNTER — Other Ambulatory Visit: Payer: Self-pay | Admitting: Internal Medicine

## 2019-09-23 MED ORDER — ONDANSETRON HCL 4 MG PO TABS: 4.0000 mg | ORAL_TABLET | Freq: Three times a day (TID) | ORAL | 0 refills | Status: DC | PRN

## 2019-09-23 NOTE — Telephone Encounter (Signed)
Zofran last filled 08/22/2019... please advise

## 2019-09-24 ENCOUNTER — Encounter: Payer: Self-pay | Admitting: Adult Health

## 2019-09-24 ENCOUNTER — Ambulatory Visit (INDEPENDENT_AMBULATORY_CARE_PROVIDER_SITE_OTHER): Payer: Managed Care, Other (non HMO) | Admitting: Adult Health

## 2019-09-24 ENCOUNTER — Other Ambulatory Visit: Payer: Self-pay

## 2019-09-24 VITALS — BP 123/75 | HR 64 | Ht 64.0 in | Wt 146.2 lb

## 2019-09-24 DIAGNOSIS — R519 Headache, unspecified: Secondary | ICD-10-CM | POA: Diagnosis not present

## 2019-09-24 DIAGNOSIS — G250 Essential tremor: Secondary | ICD-10-CM | POA: Diagnosis not present

## 2019-09-24 NOTE — Patient Instructions (Addendum)
Your Plan:  Continue Propranolol and primidone If your symptoms worsen or you develop new symptoms please let us know.   Thank you for coming to see [place] at [facility]. I hope we have been able to provide you high quality care today.  You may receive a patient satisfaction survey over the next few weeks. We would appreciate your feedback and comments so that we may continue to improve ourselves and the health of our patients.

## 2019-09-24 NOTE — Progress Notes (Addendum)
PATIENT: Kristie Miles DOB: [birth year]  REASON FOR VISIT: follow up HISTORY FROM: patient  HISTORY OF PRESENT ILLNESS: Today 09/24/19:  Kristie Miles is a 54 year old female with a history of essential tremor.  She returns today for follow-up.  Overall she feels that she is doing well on primidone and propranolol.  She did stop smoking 6 weeks ago using Chantix.  She stopped Chantix approximately 1 week ago.  She feels that her tremors have improved since she stopped smoking.  Subsequently she states that her headaches have gotten worse.  This is in the past been managed by PCP.  At the last visit she reported that she was recently started on nortriptyline however she does not recall why she is no longer taking this.  She returns today for an evaluation.  HISTORY 03/10/19:  Kristie Miles is a 54 year old female with a history of essential tremor.  She returns today for follow-up.  She continues on primidone and propranolol.  She feels that the combination of these medications have been beneficial.  She states that she notices the tremor in both upper extremities.  She notices that with her handwriting and holding a glass.  She also states that she cannot pain her fingernails.  She also notes that she has migraine headaches.  This is currently being managed by her primary care provider.  Notes that she has had a daily headache for quite some time.  Was recently started on nortriptyline 10 mg at bedtime.  Also states that she typically takes Tylenol or ibuprofen on a daily basis.  REVIEW OF SYSTEMS: Out of a complete 14 system review of symptoms, the patient complains only of the following symptoms, and all other reviewed systems are negative.  See HPI  ALLERGIES: Allergies  Allergen Reactions  . Other     Other reaction(s): rash/itching  . Latex Itching  . Sulfa Antibiotics Rash    HOME MEDICATIONS: Outpatient Medications Prior to Visit  Medication Sig Dispense Refill  . atorvastatin  (LIPITOR) 10 MG tablet TAKE 1 TABLET BY MOUTH  DAILY 90 tablet 1  . butalbital-acetaminophen-caffeine (FIORICET) 50-325-40 MG tablet TAKE 1-2 TABLETS BY MOUTH EVERY 6 (SIX) HOURS AS NEEDED FOR HEADACHE. 20 tablet 0  . cetirizine (ZYRTEC ALLERGY) 10 MG tablet Take 1 tablet (10 mg total) by mouth daily. 30 tablet 2  . fenofibrate 54 MG tablet TAKE 1 TABLET BY MOUTH  DAILY 90 tablet 1  . Flaxseed, Linseed, (FLAX SEEDS PO) Take by mouth.    . fluticasone (FLONASE) 50 MCG/ACT nasal spray Place into both nostrils daily.    . nortriptyline (PAMELOR) 10 MG capsule TAKE 1 CAPSULE (10 MG TOTAL) BY MOUTH AT BEDTIME. 90 capsule 0  . Nutritional Supplements (ESTROVEN PO) Take by mouth.    . ondansetron (ZOFRAN) 4 MG tablet Take 1 tablet (4 mg total) by mouth every 8 (eight) hours as needed. 20 tablet 0  . pantoprazole (PROTONIX) 40 MG tablet Take 1 tablet (40 mg total) by mouth daily. 90 tablet 1  . PARoxetine (PAXIL) 40 MG tablet TAKE 1 TABLET(40 MG) BY MOUTH EVERY MORNING 90 tablet 2  . primidone (MYSOLINE) 50 MG tablet TAKE 2 TABLETS BY MOUTH AT  BEDTIME 180 tablet 3  . propranolol ER (INDERAL LA) 160 MG SR capsule TAKE 1 CAPSULE BY MOUTH  DAILY 90 capsule 3  . Rhubarb (ESTROVEN MENOPAUSE RELIEF) 4 MG TABS Take by mouth.    . triamcinolone cream (KENALOG) 0.1 % Apply 1 application topically 2 (  two) times daily. 453.6 g 1  . valACYclovir (VALTREX) 500 MG tablet Take 1 tablet (500 mg total) by mouth 2 (two) times daily as needed. SCHEDULE PHYSICAL 90 tablet 0  . varenicline (CHANTIX STARTING MONTH PAK) 0.5 MG X 11 & 1 MG X 42 tablet Take one 0.5 mg tablet by mouth once daily for 3 days, then increase to one 0.5 mg tablet twice daily for 4 days, then increase to one 1 mg tablet twice daily. 53 tablet 0  . VENTOLIN HFA 108 (90 Base) MCG/ACT inhaler TAKE 2 PUFFS BY MOUTH EVERY 6 HOURS AS NEEDED FOR WHEEZE OR SHORTNESS OF BREATH 18 Inhaler 0   No facility-administered medications prior to visit.    PAST  MEDICAL HISTORY: Past Medical History:  Diagnosis Date  . Allergy   . Asthma   . GERD (gastroesophageal reflux disease)   . Hyperlipidemia   . Smoking     PAST SURGICAL HISTORY: Past Surgical History:  Procedure Laterality Date  . FOOT SURGERY Bilateral     FAMILY HISTORY: Family History  Problem Relation Age of Onset  . Arthritis Mother   . Hyperlipidemia Mother   . Hypertension Mother   . Asthma Father   . Kidney cancer Father   . Kidney disease Father   . Alcohol abuse Maternal Grandmother   . Arthritis Maternal Grandmother   . Heart disease Maternal Grandfather   . Arthritis Paternal Grandmother   . Asthma Paternal Grandmother     SOCIAL HISTORY: Social History   Socioeconomic History  . Marital status: Married    Spouse name: Not on file  . Number of children: Not on file  . Years of education: Not on file  . Highest education level: Not on file  Occupational History  . Not on file  Tobacco Use  . Smoking status: Current Every Day Smoker    Packs/day: 0.50    Types: Cigarettes  . Smokeless tobacco: Never Used  . Tobacco comment: less then 1/2 a pack a day.  prev able to tolerate chantix  Substance and Sexual Activity  . Alcohol use: Yes    Comment: social drinker  . Drug use: No  . Sexual activity: Not on file  Other Topics Concern  . Not on file  Social History Narrative  . Not on file   Social Determinants of Health   Financial Resource Strain:   . Difficulty of Paying Living Expenses: Not on file  Food Insecurity:   . Worried About Charity fundraiser in the Last Year: Not on file  . Ran Out of Food in the Last Year: Not on file  Transportation Needs:   . Lack of Transportation (Medical): Not on file  . Lack of Transportation (Non-Medical): Not on file  Physical Activity:   . Days of Exercise per Week: Not on file  . Minutes of Exercise per Session: Not on file  Stress:   . Feeling of Stress : Not on file  Social Connections:   .  Frequency of Communication with Friends and Family: Not on file  . Frequency of Social Gatherings with Friends and Family: Not on file  . Attends Religious Services: Not on file  . Active Member of Clubs or Organizations: Not on file  . Attends Archivist Meetings: Not on file  . Marital Status: Not on file  Intimate Partner Violence:   . Fear of Current or Ex-Partner: Not on file  . Emotionally Abused: Not on file  .  Physically Abused: Not on file  . Sexually Abused: Not on file      PHYSICAL EXAM  Vitals:   09/24/19 1405  BP: 123/75  Pulse: 64  Weight: 146 lb 3.2 oz (66.3 kg)  Height: 5\' 4"  (1.626 m)   Body mass index is 25.1 kg/m.  Generalized: Well developed, in no acute distress   Neurological examination  Mentation: Alert oriented to time, place, history taking. Follows all commands speech and language fluent Cranial nerve II-XII: Pupils were equal round reactive to light. Extraocular movements were full, visual field were full on confrontational test.  Head turning and shoulder shrug  were normal and symmetric. Motor: The motor testing reveals 5 over 5 strength of all 4 extremities. Good symmetric motor tone is noted throughout.  Sensory: Sensory testing is intact to soft touch on all 4 extremities. No evidence of extinction is noted.  Coordination: Cerebellar testing reveals good finger-nose-finger and heel-to-shin bilaterally.  Intention tremor noted in the upper extremities left greater than right Gait and station: Gait is normal.  DIAGNOSTIC DATA (LABS, IMAGING, TESTING) - I reviewed patient records, labs, notes, testing and imaging myself where available.  Lab Results  Component Value Date   WBC 7.1 06/28/2019   HGB 13.4 06/28/2019   HCT 39.2 06/28/2019   MCV 95.1 06/28/2019   PLT 324 06/28/2019      Component Value Date/Time   NA 137 06/28/2019 1710   K 4.1 06/28/2019 1710   CL 103 06/28/2019 1710   CO2 24 06/28/2019 1710   GLUCOSE 114 (H)  06/28/2019 1710   BUN 17 06/28/2019 1710   CREATININE 0.61 06/28/2019 1710   CALCIUM 9.5 06/28/2019 1710   PROT 6.4 04/25/2019 0859   ALBUMIN 4.3 04/25/2019 0859   AST 39 (H) 04/25/2019 0859   ALT 44 (H) 04/25/2019 0859   ALKPHOS 82 04/25/2019 0859   BILITOT 0.4 04/25/2019 0859   GFRNONAA >60 06/28/2019 1710   GFRAA >60 06/28/2019 1710   Lab Results  Component Value Date   CHOL 233 (H) 04/25/2019   HDL 37.80 (L) 04/25/2019   LDLDIRECT 91.0 04/25/2019   TRIG (H) 04/25/2019    791.0 Triglyceride is over 400; calculations on Lipids are invalid.   CHOLHDL 6 04/25/2019      ASSESSMENT AND PLAN 54 y.o. year old female  has a past medical history of Allergy, Asthma, GERD (gastroesophageal reflux disease), Hyperlipidemia, and Smoking. here with:  1.  Essential tremor   Continue primidone and propranolol  In regards to her migraine headaches encouraged her to follow-up with her PCP.  If neurology referral is needed to help manage her headaches we will be glad to see her.  Patient states that she plans to call her PCP today.  She will follow-up in 6 months or sooner if needed.   I spent 25 minutes of face-to-face and non-face-to-face time with patient.  This included previsit chart review, lab review, study review, order entry, electronic health record documentation, patient education.  Ward Givens, MSN, NP-C 09/24/2019, 2:02 PM [facility] [address], [place], [place] [address] [phone]  I reviewed the above note and documentation by the Nurse Practitioner and agree with the history, exam, assessment and plan as outlined above. I was available for consultation. Star Age, MD, PhD [facility])

## 2019-09-24 NOTE — Telephone Encounter (Signed)
Needs to make an appt to discuss

## 2019-09-25 NOTE — Telephone Encounter (Signed)
Has she ever taken anything other than Fioricet. We could try some Imitrex or Maxalt in the meantime but yes she needs to make an appt to discuss.

## 2019-11-26 ENCOUNTER — Other Ambulatory Visit: Payer: Self-pay | Admitting: Neurology

## 2019-12-02 ENCOUNTER — Other Ambulatory Visit: Payer: Self-pay | Admitting: Internal Medicine

## 2019-12-04 ENCOUNTER — Other Ambulatory Visit: Payer: Self-pay

## 2019-12-04 ENCOUNTER — Encounter: Payer: Self-pay | Admitting: Internal Medicine

## 2019-12-04 ENCOUNTER — Ambulatory Visit: Payer: Managed Care, Other (non HMO) | Admitting: Internal Medicine

## 2019-12-04 VITALS — BP 124/80 | HR 74 | Temp 97.8°F | Wt 143.0 lb

## 2019-12-04 DIAGNOSIS — N898 Other specified noninflammatory disorders of vagina: Secondary | ICD-10-CM

## 2019-12-04 DIAGNOSIS — Z780 Asymptomatic menopausal state: Secondary | ICD-10-CM

## 2019-12-04 MED ORDER — FLUCONAZOLE 150 MG PO TABS: 150.0000 mg | ORAL_TABLET | Freq: Once | ORAL | 0 refills | Status: AC

## 2019-12-04 NOTE — Progress Notes (Signed)
Subjective:    Patient ID: Kristie Miles, female    DOB: 1965/05/24, 54 y.o.   MRN: [id number]  HPI  Pt presents to the clinic today with c/o vaginal itching. She reports this started yesterday. She denies vaginal discharge, odor or abnormal bleeding. She does have some discomfort with intercourse. She denies urinary urgency, frequency, dysuria or blood in her urine. She denies fever, chills, nausea, low back pain or abdominal pain. She did an Evist 2 weeks ago for the same, prescribed Diflucan with some improvement but not complete resolution of symptoms. She is postmenopausal.  Review of Systems  Past Medical History:  Diagnosis Date  . Allergy   . Asthma   . GERD (gastroesophageal reflux disease)   . Hyperlipidemia   . Smoking     Current Outpatient Medications  Medication Sig Dispense Refill  . atorvastatin (LIPITOR) 10 MG tablet TAKE 1 TABLET BY MOUTH  DAILY 90 tablet 1  . butalbital-acetaminophen-caffeine (FIORICET) 50-325-40 MG tablet TAKE 1-2 TABLETS BY MOUTH EVERY 6 (SIX) HOURS AS NEEDED FOR HEADACHE. 20 tablet 0  . cetirizine (ZYRTEC ALLERGY) 10 MG tablet Take 1 tablet (10 mg total) by mouth daily. 30 tablet 2  . fenofibrate 54 MG tablet TAKE 1 TABLET BY MOUTH  DAILY 90 tablet 1  . Flaxseed, Linseed, (FLAX SEEDS PO) Take by mouth.    . Nutritional Supplements (ESTROVEN PO) Take by mouth.    . ondansetron (ZOFRAN) 4 MG tablet Take 1 tablet (4 mg total) by mouth every 8 (eight) hours as needed. 20 tablet 0  . pantoprazole (PROTONIX) 40 MG tablet Take 1 tablet (40 mg total) by mouth daily. 90 tablet 1  . PARoxetine (PAXIL) 40 MG tablet TAKE 1 TABLET(40 MG) BY MOUTH EVERY MORNING 90 tablet 2  . primidone (MYSOLINE) 50 MG tablet TAKE 2 TABLETS BY MOUTH AT  BEDTIME 180 tablet 3  . propranolol ER (INDERAL LA) 160 MG SR capsule TAKE 1 CAPSULE BY MOUTH  DAILY 90 capsule 3  . Rhubarb (ESTROVEN MENOPAUSE RELIEF) 4 MG TABS Take by mouth.    . triamcinolone cream (KENALOG) 0.1 % Apply  1 application topically 2 (two) times daily. 453.6 g 1  . valACYclovir (VALTREX) 500 MG tablet Take 1 tablet (500 mg total) by mouth 2 (two) times daily as needed. SCHEDULE PHYSICAL FOR Jan 2022 90 tablet 0  . varenicline (CHANTIX STARTING MONTH PAK) 0.5 MG X 11 & 1 MG X 42 tablet Take one 0.5 mg tablet by mouth once daily for 3 days, then increase to one 0.5 mg tablet twice daily for 4 days, then increase to one 1 mg tablet twice daily. 53 tablet 0  . VENTOLIN HFA 108 (90 Base) MCG/ACT inhaler TAKE 2 PUFFS BY MOUTH EVERY 6 HOURS AS NEEDED FOR WHEEZE OR SHORTNESS OF BREATH 18 Inhaler 0   No current facility-administered medications for this visit.    Allergies  Allergen Reactions  . Other     Other reaction(s): rash/itching  . Latex Itching  . Sulfa Antibiotics Rash    Family History  Problem Relation Age of Onset  . Arthritis Mother   . Hyperlipidemia Mother   . Hypertension Mother   . Asthma Father   . Kidney cancer Father   . Kidney disease Father   . Alcohol abuse Maternal Grandmother   . Arthritis Maternal Grandmother   . Heart disease Maternal Grandfather   . Arthritis Paternal Grandmother   . Asthma Paternal Grandmother     Social  History   Socioeconomic History  . Marital status: Married    Spouse name: Not on file  . Number of children: Not on file  . Years of education: Not on file  . Highest education level: Not on file  Occupational History  . Not on file  Tobacco Use  . Smoking status: Former Smoker    Packs/day: 0.50    Types: Cigarettes    Quit date: 08/17/2019    Years since quitting: 0.2  . Smokeless tobacco: Never Used  . Tobacco comment: less then 1/2 a pack a day.  prev able to tolerate chantix  Substance and Sexual Activity  . Alcohol use: Yes    Comment: social drinker  . Drug use: No  . Sexual activity: Not on file  Other Topics Concern  . Not on file  Social History Narrative  . Not on file   Social Determinants of Health   Financial  Resource Strain:   . Difficulty of Paying Living Expenses: Not on file  Food Insecurity:   . Worried About Charity fundraiser in the Last Year: Not on file  . Ran Out of Food in the Last Year: Not on file  Transportation Needs:   . Lack of Transportation (Medical): Not on file  . Lack of Transportation (Non-Medical): Not on file  Physical Activity:   . Days of Exercise per Week: Not on file  . Minutes of Exercise per Session: Not on file  Stress:   . Feeling of Stress : Not on file  Social Connections:   . Frequency of Communication with Friends and Family: Not on file  . Frequency of Social Gatherings with Friends and Family: Not on file  . Attends Religious Services: Not on file  . Active Member of Clubs or Organizations: Not on file  . Attends Archivist Meetings: Not on file  . Marital Status: Not on file  Intimate Partner Violence:   . Fear of Current or Ex-Partner: Not on file  . Emotionally Abused: Not on file  . Physically Abused: Not on file  . Sexually Abused: Not on file     Constitutional: Denies fever, malaise, fatigue, headache or abrupt weight changes.  Respiratory: Denies difficulty breathing, shortness of breath, cough or sputum production.   Cardiovascular: Denies chest pain, chest tightness, palpitations or swelling in the hands or feet.  Gastrointestinal: Denies abdominal pain, bloating, constipation, diarrhea or blood in the stool.  GU: Pt reports vaginal itching. Denies urgency, frequency, pain with urination, burning sensation, discharge, blood in urine, odor. Skin: Denies redness, rashes, lesions or ulcercations.    No other specific complaints in a complete review of systems (except as listed in HPI above).     Objective:   Physical Exam  BP 124/80   Pulse 74   Temp 97.8 F (36.6 C) (Temporal)   Wt 143 lb (64.9 kg)   SpO2 97%   BMI 24.55 kg/m   Wt Readings from Last 3 Encounters:  09/24/19 146 lb 3.2 oz (66.3 kg)  07/10/19 143  lb (64.9 kg)  06/28/19 141 lb (64 kg)    General: Appears her stated age, well developed, well nourished in NAD. Pelvic: Self swabbed. Neurological: Alert and oriented.     BMET    Component Value Date/Time   NA 137 06/28/2019 1710   K 4.1 06/28/2019 1710   CL 103 06/28/2019 1710   CO2 24 06/28/2019 1710   GLUCOSE 114 (H) 06/28/2019 1710   BUN  17 06/28/2019 1710   CREATININE 0.61 06/28/2019 1710   CALCIUM 9.5 06/28/2019 1710   GFRNONAA >60 06/28/2019 1710   GFRAA >60 06/28/2019 1710    Lipid Panel     Component Value Date/Time   CHOL 233 (H) 04/25/2019 0859   TRIG (H) 04/25/2019 0859    791.0 Triglyceride is over 400; calculations on Lipids are invalid.   HDL 37.80 (L) 04/25/2019 0859   CHOLHDL 6 04/25/2019 0859   VLDL 65.8 (H) 01/21/2019 1259    CBC    Component Value Date/Time   WBC 7.1 06/28/2019 1710   RBC 4.12 06/28/2019 1710   HGB 13.4 06/28/2019 1710   HCT 39.2 06/28/2019 1710   PLT 324 06/28/2019 1710   MCV 95.1 06/28/2019 1710   MCH 32.5 06/28/2019 1710   MCHC 34.2 06/28/2019 1710   RDW 12.6 06/28/2019 1710    Hgb A1C No results found for: HGBA1C          Assessment & Plan:   Vaginal Itching:  Will obtain wet prep r/o yeast, BV RX for Diflucan 150 mg PO x 1, repeat in 3 days Could be postmenopausal estrogen deficiency- could consider Premarin cream  Will follow up after labs, return precautions discussed  Webb Silversmith, NP This visit occurred during the SARS-CoV-2 public health emergency.  Safety protocols were in place, including screening questions prior to the visit, additional usage of staff PPE, and extensive cleaning of exam room while observing appropriate contact time as indicated for disinfecting solutions.

## 2019-12-04 NOTE — Addendum Note (Signed)
Addended by: Lurlean Nanny on: 12/04/2019 03:07 PM   Modules accepted: Orders

## 2019-12-04 NOTE — Patient Instructions (Signed)

## 2019-12-05 ENCOUNTER — Encounter: Payer: Self-pay | Admitting: Internal Medicine

## 2019-12-05 LAB — WET PREP FOR TRICH, YEAST, CLUE
Clue Cell Exam: NEGATIVE
Trichomonas Exam: NEGATIVE
Yeast Exam: NEGATIVE

## 2019-12-09 MED ORDER — PREMARIN 0.625 MG/GM VA CREA
1.0000 | TOPICAL_CREAM | VAGINAL | 12 refills | Status: DC
Start: 2019-12-10 — End: 2021-04-04

## 2019-12-12 ENCOUNTER — Other Ambulatory Visit: Payer: Self-pay | Admitting: Neurology

## 2019-12-19 ENCOUNTER — Other Ambulatory Visit: Payer: Self-pay | Admitting: Internal Medicine

## 2019-12-21 ENCOUNTER — Other Ambulatory Visit: Payer: Self-pay | Admitting: Internal Medicine

## 2019-12-21 MED ORDER — ONDANSETRON HCL 4 MG PO TABS: 4.0000 mg | ORAL_TABLET | Freq: Three times a day (TID) | ORAL | 0 refills | Status: DC | PRN

## 2019-12-21 NOTE — Telephone Encounter (Signed)
Zofran last filled 09/2019...Marland Kitchen please advise   Also, pt requesting Rx for Chantix... please advise see mychart note, respond via mychart to let pt know

## 2020-01-15 ENCOUNTER — Encounter: Payer: Self-pay | Admitting: Internal Medicine

## 2020-01-15 ENCOUNTER — Telehealth: Payer: Self-pay | Admitting: Internal Medicine

## 2020-01-15 DIAGNOSIS — L600 Ingrowing nail: Secondary | ICD-10-CM

## 2020-01-15 NOTE — Telephone Encounter (Signed)
Patient called in complaining that she has a really bad ingrown toenail. Its painful, red and swollen > the earliest I could get her an appt is Monday. Can you see if there is anything she can do for the patient or possibly have her come in sooner? Thanks, EM

## 2020-01-15 NOTE — Telephone Encounter (Signed)
Have her send me a picture of her toe via mychart. Soak in epsom salt for 10 minutes daily

## 2020-01-16 ENCOUNTER — Other Ambulatory Visit: Payer: Self-pay | Admitting: Internal Medicine

## 2020-01-16 MED ORDER — VALACYCLOVIR HCL 500 MG PO TABS: 500.0000 mg | ORAL_TABLET | Freq: Two times a day (BID) | ORAL | 0 refills | Status: DC | PRN

## 2020-01-18 MED ORDER — CEPHALEXIN 500 MG PO CAPS: 500.0000 mg | ORAL_CAPSULE | Freq: Three times a day (TID) | ORAL | 0 refills | Status: DC

## 2020-01-19 ENCOUNTER — Ambulatory Visit: Payer: Managed Care, Other (non HMO) | Admitting: Internal Medicine

## 2020-01-23 ENCOUNTER — Encounter: Payer: Self-pay | Admitting: Podiatry

## 2020-01-23 ENCOUNTER — Other Ambulatory Visit: Payer: Self-pay

## 2020-01-23 ENCOUNTER — Ambulatory Visit: Payer: Managed Care, Other (non HMO) | Admitting: Podiatry

## 2020-01-23 DIAGNOSIS — L600 Ingrowing nail: Secondary | ICD-10-CM | POA: Diagnosis not present

## 2020-01-23 MED ORDER — GENTAMICIN SULFATE 0.1 % EX CREA
1.0000 | TOPICAL_CREAM | Freq: Two times a day (BID) | CUTANEOUS | 1 refills | Status: DC
Start: 2020-01-23 — End: 2021-04-04

## 2020-01-23 NOTE — Progress Notes (Signed)
   Subjective: Patient presents today for evaluation of pain to the lateral border left great toe. Patient is concerned for possible ingrown nail. Patient presents today for further treatment and evaluation.  Past Medical History:  Diagnosis Date  . Allergy   . Asthma   . GERD (gastroesophageal reflux disease)   . Hyperlipidemia   . Smoking     Objective:  General: Well developed, nourished, in no acute distress, alert and oriented x3   Dermatology: Skin is warm, dry and supple bilateral.  Lateral border left great toe appears to be erythematous with evidence of an ingrowing nail. Pain on palpation noted to the border of the nail fold. The remaining nails appear unremarkable at this time. There are no open sores, lesions.  Vascular: Dorsalis Pedis artery and Posterior Tibial artery pedal pulses palpable. No lower extremity edema noted.   Neruologic: Grossly intact via light touch bilateral.  Musculoskeletal: Muscular strength within normal limits in all groups bilateral. Normal range of motion noted to all pedal and ankle joints.   Assesement: #1 Paronychia with ingrowing nail lateral border left great toe #2 Pain in toe #3 Incurvated nail  Plan of Care:  1. Patient evaluated.  2. Discussed treatment alternatives and plan of care. Explained nail avulsion procedure and post procedure course to patient. 3. Patient opted for permanent partial nail avulsion of the lateral border left great toe.  4. Prior to procedure, local anesthesia infiltration utilized using 3 ml of a 50:50 mixture of 2% plain lidocaine and 0.5% plain marcaine in a normal hallux block fashion and a betadine prep performed.  5. Partial permanent nail avulsion with chemical matrixectomy performed using [id number] applications of phenol followed by alcohol flush.  6. Light dressing applied. 7.  Prescription for gentamicin cream applied daily  Eight.  Return to clinic 2 weeks.  Edrick Kins, DPM [facility]  Dr. Edrick Kins, DPM    [address], [place] [address]                Office [phone]  Fax [phone]

## 2020-01-23 NOTE — Patient Instructions (Signed)

## 2020-01-26 ENCOUNTER — Other Ambulatory Visit: Payer: Self-pay | Admitting: Neurology

## 2020-01-27 ENCOUNTER — Ambulatory Visit: Payer: Managed Care, Other (non HMO) | Admitting: Internal Medicine

## 2020-02-01 ENCOUNTER — Encounter: Payer: Self-pay | Admitting: Internal Medicine

## 2020-02-06 ENCOUNTER — Encounter: Payer: Self-pay | Admitting: Podiatry

## 2020-02-06 ENCOUNTER — Other Ambulatory Visit: Payer: Self-pay

## 2020-02-06 ENCOUNTER — Ambulatory Visit: Payer: Managed Care, Other (non HMO) | Admitting: Podiatry

## 2020-02-06 DIAGNOSIS — L600 Ingrowing nail: Secondary | ICD-10-CM

## 2020-02-06 MED ORDER — DOXYCYCLINE HYCLATE 100 MG PO TABS: 100.0000 mg | ORAL_TABLET | Freq: Two times a day (BID) | ORAL | 0 refills | Status: DC

## 2020-02-06 NOTE — Progress Notes (Signed)
   Subjective: 55 y.o. female presents today status post permanent nail avulsion procedure of the lateral border left great toe that was performed on 01/23/2020.  Patient states she is feeling much better.  She continues to have some redness around the area.  She has been soaking her foot and applying the antibiotic cream as instructed.  No new complaints at this time  Past Medical History:  Diagnosis Date  . Allergy   . Asthma   . GERD (gastroesophageal reflux disease)   . Hyperlipidemia   . Smoking     Objective: Skin is warm, dry and supple. Nail and respective nail fold appears to be healing appropriately. Open wound to the associated nail fold with a granular wound base and moderate amount of fibrotic tissue. Minimal drainage noted. Mild erythema around the periungual region likely due to phenol chemical matricectomy.  Assessment: #1 postop permanent partial nail avulsion lateral border left great toe #2 open wound periungual nail fold of respective digit.   Plan of care: #1 patient was evaluated  #2 debridement of open wound was performed to the periungual border of the respective toe using a currette. Antibiotic ointment and Band-Aid was applied. #3  Prescription for doxycycline 100 mg 2 times daily #20 #4 return to clinic in 2 weeks   Edrick Kins, DPM [facility]  Dr. Edrick Kins, DPM    [address], [place] [address]                Office [phone]  Fax [phone]

## 2020-02-08 ENCOUNTER — Other Ambulatory Visit: Payer: Self-pay | Admitting: Internal Medicine

## 2020-02-17 ENCOUNTER — Ambulatory Visit: Payer: Managed Care, Other (non HMO) | Admitting: Internal Medicine

## 2020-02-18 ENCOUNTER — Other Ambulatory Visit: Payer: Self-pay

## 2020-02-19 ENCOUNTER — Encounter: Payer: Self-pay | Admitting: Internal Medicine

## 2020-02-19 ENCOUNTER — Ambulatory Visit: Payer: Managed Care, Other (non HMO) | Admitting: Internal Medicine

## 2020-02-19 ENCOUNTER — Ambulatory Visit (INDEPENDENT_AMBULATORY_CARE_PROVIDER_SITE_OTHER)
Admission: RE | Admit: 2020-02-19 | Discharge: 2020-02-19 | Disposition: A | Discharge status: Home / Self Care | Payer: Managed Care, Other (non HMO) | Source: Ambulatory Visit | Attending: Internal Medicine | Admitting: Internal Medicine

## 2020-02-19 VITALS — BP 116/78 | HR 57 | Temp 97.4°F | Wt 145.0 lb

## 2020-02-19 DIAGNOSIS — M898X8 Other specified disorders of bone, other site: Secondary | ICD-10-CM

## 2020-02-19 DIAGNOSIS — R519 Headache, unspecified: Secondary | ICD-10-CM

## 2020-02-19 IMAGING — DX DG STERNUM 2+V
2 series · 3 of 3 positions shown · non-contrast
Comparison: None.

CLINICAL DATA: Lump about xiphoid process.

EXAM:
STERNUM - 2+ VIEW

[sternum obl]
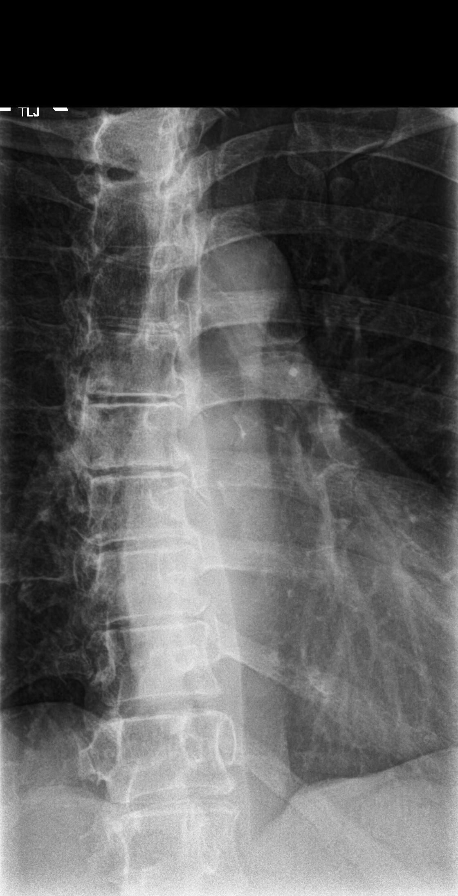

[Series 2: sternum lat · 0.14mm/px · 2 of 2 slices shown]
[im 1/2]
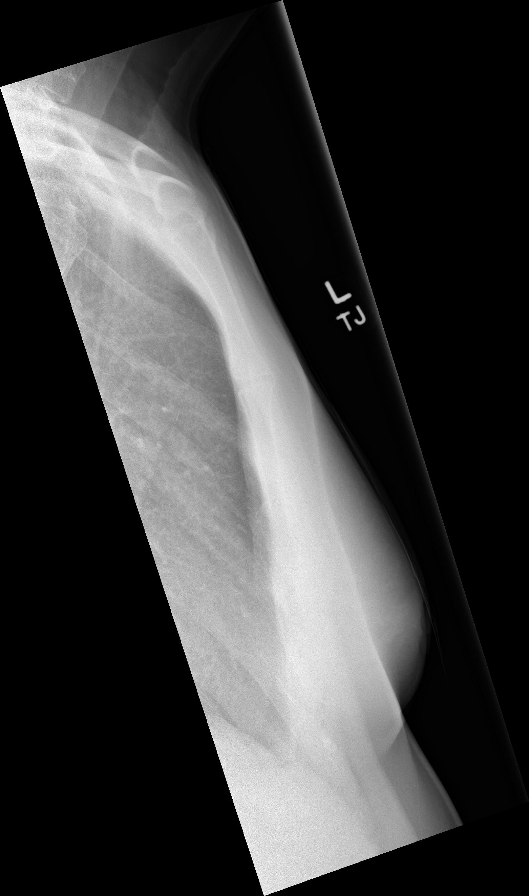
[im 2/2]
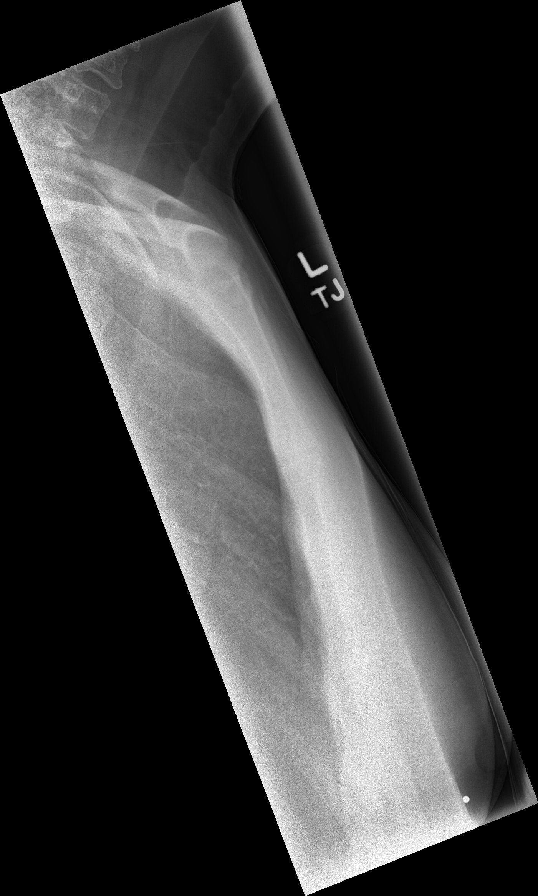

[3 of 3 positions shown; findings below may reference images not displayed]

FINDINGS: Cortical margins of the sternum are intact. There is no evidence of
fracture or other focal bone lesions. No focal soft tissue
abnormality in the region of clinical concern denoted by a BB.
IMPRESSION: No radiographic findings to explain lump about the xiphoid process.

## 2020-02-19 MED ORDER — TOPIRAMATE 25 MG PO TABS
25.0000 mg | ORAL_TABLET | Freq: Every evening | ORAL | 1 refills | Status: DC
Start: 2020-02-19 — End: 2020-03-15

## 2020-02-19 NOTE — Progress Notes (Signed)
Subjective:    Patient ID: Kristie Miles, female    DOB: [birth year], 55 y.o.   MRN: [id number]  HPI  Pt presents to the clinic today with c/o a lump of her sternum.  She noticed this 2 months ago.  She reports the area feels tight but is not overtly painful.  She does feel like it has decreased in size over the last 2 days.  She denies any injury to the area.  She has not tried anything OTC for this.  She also reports frequent headaches.  These are occurring at least 3 times a week.  The pain is located in her left temple.  She describes the pain as pressure.  Her headaches seem worse at night.  She does have some associated sensitivity to light but denies sensitivity to sound, dizziness, nausea or vomiting.  She has tried Tylenol and Excedrin OTC with minimal relief of symptoms.  Review of Systems   Past Medical History:  Diagnosis Date  . Allergy   . Asthma   . GERD (gastroesophageal reflux disease)   . Hyperlipidemia   . Smoking     Current Outpatient Medications  Medication Sig Dispense Refill  . atorvastatin (LIPITOR) 10 MG tablet TAKE 1 TABLET BY MOUTH  DAILY 90 tablet 1  . cetirizine (ZYRTEC ALLERGY) 10 MG tablet Take 1 tablet (10 mg total) by mouth daily. 30 tablet 2  . conjugated estrogens (PREMARIN) vaginal cream Place 1 Applicatorful vaginally 3 (three) times a week. 42.5 g 12  . doxycycline (VIBRA-TABS) 100 MG tablet Take 1 tablet (100 mg total) by mouth 2 (two) times daily. 20 tablet 0  . fenofibrate 54 MG tablet TAKE 1 TABLET BY MOUTH  DAILY 90 tablet 1  . Flaxseed, Linseed, (FLAX SEEDS PO) Take by mouth.    Marland Kitchen gentamicin cream (GARAMYCIN) 0.1 % Apply 1 application topically 2 (two) times daily. 30 g 1  . Nutritional Supplements (ESTROVEN PO) Take by mouth.    . ondansetron (ZOFRAN) 4 MG tablet Take 1 tablet (4 mg total) by mouth every 8 (eight) hours as needed. 20 tablet 0  . PARoxetine (PAXIL) 40 MG tablet TAKE 1 TABLET(40 MG) BY MOUTH EVERY MORNING 90 tablet 2  .  primidone (MYSOLINE) 50 MG tablet TAKE 2 TABLETS BY MOUTH AT  BEDTIME 180 tablet 3  . propranolol ER (INDERAL LA) 160 MG SR capsule TAKE 1 CAPSULE BY MOUTH  DAILY 90 capsule 3  . Rhubarb (ESTROVEN MENOPAUSE RELIEF) 4 MG TABS Take by mouth.    . valACYclovir (VALTREX) 500 MG tablet TAKE 1 TABLET BY MOUTH TWICE A DAY AS NEEDED 60 tablet 1   No current facility-administered medications for this visit.    Allergies  Allergen Reactions  . Other     Other reaction(s): rash/itching  . Latex Itching  . Sulfa Antibiotics Rash    Family History  Problem Relation Age of Onset  . Arthritis Mother   . Hyperlipidemia Mother   . Hypertension Mother   . Asthma Father   . Kidney cancer Father   . Kidney disease Father   . Alcohol abuse Maternal Grandmother   . Arthritis Maternal Grandmother   . Heart disease Maternal Grandfather   . Arthritis Paternal Grandmother   . Asthma Paternal Grandmother     Social History   Socioeconomic History  . Marital status: Married    Spouse name: Not on file  . Number of children: Not on file  . Years of education: Not  on file  . Highest education level: Not on file  Occupational History  . Not on file  Tobacco Use  . Smoking status: Former Smoker    Packs/day: 0.50    Types: Cigarettes    Quit date: 08/17/2019    Years since quitting: 0.5  . Smokeless tobacco: Never Used  . Tobacco comment: less then 1/2 a pack a day.  prev able to tolerate chantix  Substance and Sexual Activity  . Alcohol use: Yes    Comment: social drinker  . Drug use: No  . Sexual activity: Not on file  Other Topics Concern  . Not on file  Social History Narrative  . Not on file   Social Determinants of Health   Financial Resource Strain: Not on file  Food Insecurity: Not on file  Transportation Needs: Not on file  Physical Activity: Not on file  Stress: Not on file  Social Connections: Not on file  Intimate Partner Violence: Not on file     Constitutional:  Patient reports headaches.  Denies fever, malaise, fatigue, or abrupt weight changes.  HEENT: Denies eye pain, eye redness, ear pain, ringing in the ears, wax buildup, runny nose, nasal congestion, bloody nose, or sore throat. Respiratory: Denies difficulty breathing, shortness of breath, cough or sputum production.   Cardiovascular: Denies chest pain, chest tightness, palpitations or swelling in the hands or feet.  Gastrointestinal: Denies abdominal pain, bloating, constipation, diarrhea or blood in the stool.  Musculoskeletal: Patient reports lump of sternum.  Denies decrease in range of motion, difficulty with gait, muscle pain or joint pain and swelling.  Skin: Denies redness, rashes, lesions or ulcercations.  Neurological: Denies dizziness, difficulty with memory, difficulty with speech or problems with balance and coordination.    No other specific complaints in a complete review of systems (except as listed in HPI above).   Objective:   Physical Exam  BP 116/78   Pulse (!) 57   Temp (!) 97.4 F (36.3 C) (Temporal)   Wt 65.8 kg   SpO2 98%   BMI 24.89 kg/m   Wt Readings from Last 3 Encounters:  12/04/19 143 lb (64.9 kg)  09/24/19 146 lb 3.2 oz (66.3 kg)  07/10/19 143 lb (64.9 kg)    General: Appears her stated age, well developed, well nourished in NAD. Skin: 0.5 cm round raised mass noted of the xiphoid process. HEENT: Head: normal shape and size; Eyes: sclera white, no icterus, conjunctiva pink, PERRLA and EOMs intact;  Cardiovascular: Normal rate and rhythm. S1,S2 noted.  No murmur, rubs or gallops noted.  Pulmonary/Chest: Normal effort and positive vesicular breath sounds. No respiratory distress. No wheezes, rales or ronchi noted.  Neurological: Alert and oriented.  Coordination normal.    BMET    Component Value Date/Time   NA 137 06/28/2019 1710   K 4.1 06/28/2019 1710   CL 103 06/28/2019 1710   CO2 24 06/28/2019 1710   GLUCOSE 114 (H) 06/28/2019 1710   BUN  17 06/28/2019 1710   CREATININE 0.61 06/28/2019 1710   CALCIUM 9.5 06/28/2019 1710   GFRNONAA >60 06/28/2019 1710   GFRAA >60 06/28/2019 1710    Lipid Panel     Component Value Date/Time   CHOL 233 (H) 04/25/2019 0859   TRIG (H) 04/25/2019 0859    791.0 Triglyceride is over 400; calculations on Lipids are invalid.   HDL 37.80 (L) 04/25/2019 0859   CHOLHDL 6 04/25/2019 0859   VLDL 65.8 (H) 01/21/2019 1259  CBC    Component Value Date/Time   WBC 7.1 06/28/2019 1710   RBC 4.12 06/28/2019 1710   HGB 13.4 06/28/2019 1710   HCT 39.2 06/28/2019 1710   PLT 324 06/28/2019 1710   MCV 95.1 06/28/2019 1710   MCH 32.5 06/28/2019 1710   MCHC 34.2 06/28/2019 1710   RDW 12.6 06/28/2019 1710    Hgb A1C No results found for: HGBA1C         Assessment & Plan:  Mass of Sternum:  X-ray sternum for further evaluation  Frequent Headaches:  We will trial Topamax 25 mg p.o. nightly Continue Excedrin and Tylenol as needed  Update me in 1 month and let me know how your headaches are doing   Webb Silversmith, NP This visit occurred during the SARS-CoV-2 public health emergency.  Safety protocols were in place, including screening questions prior to the visit, additional usage of staff PPE, and extensive cleaning of exam room while observing appropriate contact time as indicated for disinfecting solutions.

## 2020-02-20 ENCOUNTER — Ambulatory Visit: Payer: Managed Care, Other (non HMO) | Admitting: Podiatry

## 2020-02-23 ENCOUNTER — Encounter: Payer: Self-pay | Admitting: Internal Medicine

## 2020-02-23 NOTE — Patient Instructions (Signed)
Chronic Migraine Headache A migraine headache is throbbing pain that is usually on one side of the head. Migraines that keep coming back are called recurring migraines. A migraine is called a chronic migraine if it happens at least 15 days in a month for more than 3 months. Talk with your doctor about what things may bring on (trigger) your migraines. What are the causes? The exact cause of this condition is not known. A migraine may be caused when nerves in the brain become irritated and release chemicals that cause irritation and swelling (inflammation) of blood vessels. The irritation and swelling of the blood vessels causes pain. Migraines may be brought on or caused by:  Smoking.  Foods and drinks, such as: ? Cheese. ? Chocolate. ? Alcohol. ? Caffeine.  Certain substances in some foods or drinks.  Some medicines. Other things that may bring on a migraine include:  Periods, for women.  Stress.  Not enough sleep or too much sleep.  Feeling very tired.  Bright lights or loud noises.  Smells  Weather changes and being at high altitude. What increases the risk? The following factors may make you more likely to have chronic migraine:  Having migraines or family members who have them.  Being very sad (depressed) or feeling worried or nervous (anxious).  Taking a lot of pain medicine.  Having problems sleeping.  Having heart disease, diabetes, or being very overweight (obese). What are the signs or symptoms? Symptoms of this condition include:  Pain that feels like it throbs.  Pain that is usually only on one side of the head. In some cases, the pain may be on both sides of the head or around the head or neck.  Very bad pain that keeps you from doing daily activities.  Pain that gets worse with activity.  Feeling like you may vomit (feeling nauseous) or vomiting.  Pain when you are around bright lights, loud noises, or activity.  Being sensitive to bright  lights, loud noises, or smells.  Feeling dizzy. How is this treated? This condition is treated with:  Medicines. These help to: ? Lessen pain and the feeling like you may vomit. ? Prevent migraines.  Changes to your diet or sleep.  Therapy. This might include: ? Relaxation training. ? Biofeedback. This is a treatment that teaches you to relax, use your brain to lower your heart rate, and control your breathing. ? Cognitive behavioral therapy (CBT). This therapy helps you set goals and follow up on the changes that you make.  Acupuncture.  Using a device that provides electrical stimulation to your nerves, which can help take away pain.  Surgery, if the other treatments do not work. Follow these instructions at home: Medicines  Take over-the-counter and prescription medicines only as told by your doctor.  Ask your doctor if the medicine prescribed to you requires you to avoid driving or using machinery. Lifestyle  Do not use any products that contain nicotine or tobacco, such as cigarettes, e-cigarettes, and chewing tobacco. If you need help quitting, ask your doctor.  Do not drink alcohol.  Get 7-9 hours of sleep each night.  Lower the stress in your life. Ask your doctor about ways to do this.  Stay at a healthy weight. Talk with your doctor if you need help losing weight.  Get regular exercise.   General instructions  Keep a journal to find out if certain things bring on migraines. For example, write down: ? What you eat and drink. ? How   much sleep you get. ? Any change to your diet or medicines.  Lie down in a dark, quiet room when you have a migraine.  Try placing a cool towel over your head when you have a migraine.  Keep lights dim if bright lights bother you or make your migraines worse.  Keep all follow-up visits as told by your doctor. This is important.   Where to find more information  Coalition for Headache and Migraine Patients (CHAMP):  headachemigraine.org  American Migraine Foundation: americanmigrainefoundation.org  National Headache Foundation: headaches.org Contact a doctor if:  Medicine does not help your migraine.  Your pain keeps coming back. Get help right away if:  Your migraine becomes really bad and medicine does not help.  You have a stiff neck and fever.  You have trouble seeing.  Your muscles are weak or you lose control of them.  You lose your balance or have trouble walking.  You feel like you will faint or you faint.  You start having sudden, very bad headaches.  You have a seizure. Summary  A migraine headache is very bad, throbbing pain that is usually on one side of the head.  A chronic migraine is a migraine that happens 15 days in a month for more than 3 months.  Talk with your doctor about what things may bring on your migraines.  Lie down in a dark, quiet room when you have a migraine.  Keep a journal. This can help you find out if certain things make you have migraines. This information is not intended to replace advice given to you by your health care provider. Make sure you discuss any questions you have with your health care provider. Document Revised: 02/05/2019 Document Reviewed: 02/05/2019 Elsevier Patient Education  2021 Elsevier Inc.  

## 2020-03-01 ENCOUNTER — Other Ambulatory Visit: Payer: Self-pay | Admitting: Internal Medicine

## 2020-03-13 ENCOUNTER — Other Ambulatory Visit: Payer: Self-pay | Admitting: Internal Medicine

## 2020-03-15 ENCOUNTER — Encounter: Payer: Self-pay | Admitting: Family Medicine

## 2020-03-15 ENCOUNTER — Telehealth (INDEPENDENT_AMBULATORY_CARE_PROVIDER_SITE_OTHER): Payer: Managed Care, Other (non HMO) | Admitting: Family Medicine

## 2020-03-15 VITALS — Temp 97.1°F

## 2020-03-15 DIAGNOSIS — F17210 Nicotine dependence, cigarettes, uncomplicated: Secondary | ICD-10-CM

## 2020-03-15 DIAGNOSIS — J019 Acute sinusitis, unspecified: Secondary | ICD-10-CM | POA: Diagnosis not present

## 2020-03-15 MED ORDER — AMOXICILLIN-POT CLAVULANATE 500-125 MG PO TABS: 1.0000 | ORAL_TABLET | Freq: Two times a day (BID) | ORAL | 0 refills | Status: AC

## 2020-03-15 NOTE — Progress Notes (Signed)
Virtual Visit via Telephone Note  I connected with Kristie Miles on 03/15/20 at  1:30 PM EDT by telephone and verified that I am speaking with the correct person using two identifiers.   I discussed the limitations, risks, security and privacy concerns of performing an evaluation and management service by telephone and the availability of in person appointments. I also discussed with the patient that there may be a patient responsible charge related to this service. The patient expressed understanding and agreed to proceed.  Location patient: home Location provider: work or home office Participants present for the call: patient, provider Patient did not have a visit in the prior 7 days to address this/these issue(s).   History of Present Illness: Pt with sore throat last Monday and Tuesday.  Then developed rhinorrhea, nasal congestion, cough, HA, cough, decreased appetite. Denies ear pain/pressure, facial pain/pressure, dental pain, N/V, fever. Tried OTC sinus meds, antihistamine, mucinex, Tylenol sinus, cold meds.  Pt is a smoker.  Smoking less since being since, now 5-6 cigarettes/day typically smokes 1/2 pack.  Was on Chantix until it was recalled.  Started at age 55.  Pt had a negative COVID test this am.     Pt started cephalexin 500 mg TID last Thursday or Friday.  Observations/Objective: Patient sounds cheerful and well on the phone. I do not appreciate any SOB. Speech and thought processing are grossly intact. Patient reported vitals:  Assessment and Plan: Acute sinusitis, recurrence not specified, unspecified location  -continue supportive care - Plan: amoxicillin-clavulanate (AUGMENTIN) 500-125 MG tablet  Cigarette nicotine dependence without complication -Smoking cessation: 3 minutes, less than 10 minutes -Patient advised cut down on nicotine use  Given current symptoms and h/o tobacco use also consider COPD exacerbation.  DG sternum from 02/19/20 and CXR from 06/28/19  reviewed and normal.  Follow Up Instructions:  Prn with pcp  [id number] [id number] [id number] 11-20 [id number] 21-30 I did not refer this patient for an OV in the next 24 hours for this/these issue(s).  I discussed the assessment and treatment plan with the patient. The patient was provided an opportunity to ask questions and all were answered. The patient agreed with the plan and demonstrated an understanding of the instructions.   The patient was advised to call back or seek an in-person evaluation if the symptoms worsen or if the condition fails to improve as anticipated.  I provided 11 minutes of non-face-to-face time during this encounter.   Billie Ruddy, MD

## 2020-03-18 ENCOUNTER — Other Ambulatory Visit: Payer: Self-pay | Admitting: Internal Medicine

## 2020-03-24 ENCOUNTER — Ambulatory Visit: Payer: Managed Care, Other (non HMO) | Admitting: Adult Health

## 2020-03-25 ENCOUNTER — Ambulatory Visit: Payer: Managed Care, Other (non HMO) | Admitting: Adult Health

## 2020-05-11 ENCOUNTER — Other Ambulatory Visit: Payer: Self-pay | Admitting: Internal Medicine

## 2020-06-04 ENCOUNTER — Other Ambulatory Visit: Payer: Self-pay | Admitting: Internal Medicine

## 2020-06-04 ENCOUNTER — Encounter: Payer: Self-pay | Admitting: Internal Medicine

## 2020-06-04 MED ORDER — BUTALBITAL-APAP-CAFFEINE 50-325-40 MG PO TABS: 1.0000 | ORAL_TABLET | Freq: Four times a day (QID) | ORAL | 0 refills | Status: DC | PRN

## 2020-06-04 MED ORDER — ONDANSETRON HCL 4 MG PO TABS: 4.0000 mg | ORAL_TABLET | Freq: Three times a day (TID) | ORAL | 0 refills | Status: DC | PRN

## 2020-06-07 ENCOUNTER — Ambulatory Visit: Payer: Managed Care, Other (non HMO) | Admitting: Family Medicine

## 2020-06-07 DIAGNOSIS — Z0289 Encounter for other administrative examinations: Secondary | ICD-10-CM

## 2020-06-27 ENCOUNTER — Other Ambulatory Visit: Payer: Self-pay | Admitting: Internal Medicine

## 2020-06-28 ENCOUNTER — Ambulatory Visit: Payer: Managed Care, Other (non HMO) | Admitting: Family Medicine

## 2020-06-28 LAB — LIPID PANEL
Cholesterol: 269 — AB (ref 0–200)
HDL: 48 (ref 35–70)
LDL Cholesterol: 142
Triglycerides: 426 — AB (ref 40–160)

## 2020-06-28 LAB — HEMOGLOBIN A1C: Hemoglobin A1C: 6

## 2020-06-28 LAB — BASIC METABOLIC PANEL
Creatinine: 0.7 (ref 0.5–1.1)
Glucose: 119

## 2020-06-30 ENCOUNTER — Other Ambulatory Visit: Payer: Self-pay | Admitting: Internal Medicine

## 2020-07-01 MED ORDER — BUTALBITAL-APAP-CAFFEINE 50-325-40 MG PO TABS: 1.0000 | ORAL_TABLET | Freq: Four times a day (QID) | ORAL | 0 refills | Status: DC | PRN

## 2020-07-01 MED ORDER — ONDANSETRON HCL 4 MG PO TABS: 4.0000 mg | ORAL_TABLET | Freq: Three times a day (TID) | ORAL | 0 refills | Status: DC | PRN

## 2020-07-12 ENCOUNTER — Encounter: Payer: Self-pay | Admitting: Family Medicine

## 2020-07-12 ENCOUNTER — Other Ambulatory Visit: Payer: Self-pay

## 2020-07-12 ENCOUNTER — Ambulatory Visit: Payer: Managed Care, Other (non HMO) | Admitting: Family Medicine

## 2020-07-12 VITALS — BP 118/72 | HR 84 | Temp 97.7°F | Ht 64.0 in | Wt 140.0 lb

## 2020-07-12 DIAGNOSIS — F17200 Nicotine dependence, unspecified, uncomplicated: Secondary | ICD-10-CM

## 2020-07-12 DIAGNOSIS — G43909 Migraine, unspecified, not intractable, without status migrainosus: Secondary | ICD-10-CM

## 2020-07-12 DIAGNOSIS — G250 Essential tremor: Secondary | ICD-10-CM

## 2020-07-12 DIAGNOSIS — Z8673 Personal history of transient ischemic attack (TIA), and cerebral infarction without residual deficits: Secondary | ICD-10-CM | POA: Diagnosis not present

## 2020-07-12 DIAGNOSIS — E785 Hyperlipidemia, unspecified: Secondary | ICD-10-CM | POA: Diagnosis not present

## 2020-07-12 DIAGNOSIS — G4452 New daily persistent headache (NDPH): Secondary | ICD-10-CM

## 2020-07-12 DIAGNOSIS — G43009 Migraine without aura, not intractable, without status migrainosus: Secondary | ICD-10-CM

## 2020-07-12 MED ORDER — BUTALBITAL-APAP-CAFFEINE 50-325-40 MG PO TABS: 1.0000 | ORAL_TABLET | Freq: Four times a day (QID) | ORAL | 0 refills | Status: DC | PRN

## 2020-07-12 MED ORDER — ESOMEPRAZOLE MAGNESIUM 20 MG PO CPDR: 20.0000 mg | DELAYED_RELEASE_CAPSULE | Freq: Every day | ORAL | 1 refills | Status: DC

## 2020-07-12 MED ORDER — ATORVASTATIN CALCIUM 10 MG PO TABS: 10.0000 mg | ORAL_TABLET | Freq: Every day | ORAL | 1 refills | Status: DC

## 2020-07-12 MED ORDER — ASPIRIN 81 MG PO TBEC: 81.0000 mg | DELAYED_RELEASE_TABLET | Freq: Every day | ORAL | Status: AC

## 2020-07-12 MED ORDER — FENOFIBRATE 54 MG PO TABS: 54.0000 mg | ORAL_TABLET | Freq: Every day | ORAL | 1 refills | Status: DC

## 2020-07-12 MED ORDER — ONDANSETRON HCL 4 MG PO TABS: 4.0000 mg | ORAL_TABLET | Freq: Three times a day (TID) | ORAL | 0 refills | Status: DC | PRN

## 2020-07-12 MED ORDER — TOPIRAMATE 50 MG PO TABS: 50.0000 mg | ORAL_TABLET | Freq: Every evening | ORAL | 1 refills | Status: DC

## 2020-07-12 NOTE — Progress Notes (Signed)
Patient ID: Kristie Miles, female    DOB: 07/21/1965, 55 y.o.   MRN: [id number]  This visit was conducted in person.  BP 118/72   Pulse 84   Temp 97.7 F (36.5 C) (Temporal)   Ht $R'5\' 4"'Ia$  (1.626 m)   Wt 140 lb (63.5 kg)   SpO2 97%   BMI 24.03 kg/m    CC: worsening HA  Subjective:   HPI: Kristie Miles is a 55 y.o. female presenting on 07/12/2020 for Headache, Migraine, and Back Pain   Chronic daily headache for the past month. Headache devolves into migraine by the evenings to the point of nausea/vomiting. HA present when she wakes up but doesn't wake her up, usually L temporal region. +photo/phonophobia, activity limiting. No aura. No vision changes, paresthesias, numbness, hemiparesis, slurred speech. Does snore. Husband has mentioned she stops breathing at night. Non-restorative sleep. Daytime somnolence.   Known chronic headaches/migraines since college, managed with propranolol ER $RemoveBefore'160mg'XiFotPzlaIPIe$  daily. Migraines did improve after birth of daughter 48 yrs ago but they have started recurring. Triggers include stress, poor sleep. Migraines managed by PCP.   Topamax $Remove'25mg'GTIJhQT$  started 03/2020 - initially helpful but now again migraines worsening. Fioricet abortively helps. Several different tried triptans weren't effective (this was prior to menopause).  H/o CVA age 89s incidentally found on imaging.  Dyslipidemia - on fibrate and statin.  On primidone and propranolol ER $RemoveBefore'160mg'rTJibZjZyyJeI$  daily for essential tremors through neurology.  Takes OTC estroven for post-menopausal symptoms.  Upcoming yearly labwork 07/28/2020 through [facility].   Fmhx migraines (father), ocular migraines (mother) and cluster headaches (mother).   Works at [employer] in [place].  LMP 05/2015.  Current smoker - 1/2 ppd for 38 yrs. Chantix was effective, would be interested in retrial.  Alcohol - no significant intake.      Relevant past medical, surgical, family and social history reviewed and updated as indicated. Interim medical  history since our last visit reviewed. Allergies and medications reviewed and updated. Outpatient Medications Prior to Visit  Medication Sig Dispense Refill   conjugated estrogens (PREMARIN) vaginal cream Place 1 Applicatorful vaginally 3 (three) times a week. 42.5 g 12   Flaxseed, Linseed, (FLAX SEEDS PO) Take by mouth.     gentamicin cream (GARAMYCIN) 0.1 % Apply 1 application topically 2 (two) times daily. 30 g 1   Nutritional Supplements (ESTROVEN PO) Take by mouth.     PARoxetine (PAXIL) 40 MG tablet TAKE 1 TABLET(40 MG) BY MOUTH EVERY MORNING 90 tablet 2   primidone (MYSOLINE) 50 MG tablet TAKE 2 TABLETS BY MOUTH AT  BEDTIME 180 tablet 3   propranolol ER (INDERAL LA) 160 MG SR capsule TAKE 1 CAPSULE BY MOUTH  DAILY 90 capsule 3   Rhubarb (ESTROVEN MENOPAUSE RELIEF) 4 MG TABS Take by mouth.     valACYclovir (VALTREX) 500 MG tablet TAKE 1 TABLET BY MOUTH TWICE A DAY AS NEEDED 180 tablet 1   atorvastatin (LIPITOR) 10 MG tablet Take 1 tablet (10 mg total) by mouth daily. MUST SCHEDULE PHYSICAL 90 tablet 0   butalbital-acetaminophen-caffeine (FIORICET) 50-325-40 MG tablet Take 1 tablet by mouth every 6 (six) hours as needed for headache. 30 tablet 0   fenofibrate 54 MG tablet Take 1 tablet (54 mg total) by mouth daily. MUST SCHEDULE PHYSICAL 90 tablet 0   ondansetron (ZOFRAN) 4 MG tablet Take 1 tablet (4 mg total) by mouth every 8 (eight) hours as needed. 20 tablet 0   topiramate (TOPAMAX) 25 MG tablet TAKE 1 TABLET BY  MOUTH EVERYDAY AT BEDTIME 90 tablet 1   cetirizine (ZYRTEC ALLERGY) 10 MG tablet Take 1 tablet (10 mg total) by mouth daily. 30 tablet 2   No facility-administered medications prior to visit.     Per HPI unless specifically indicated in ROS section below Review of Systems  Objective:  BP 118/72   Pulse 84   Temp 97.7 F (36.5 C) (Temporal)   Ht $R'5\' 4"'Dn$  (1.626 m)   Wt 140 lb (63.5 kg)   SpO2 97%   BMI 24.03 kg/m   Wt Readings from Last 3 Encounters:  07/12/20 140 lb  (63.5 kg)  02/19/20 145 lb (65.8 kg)  12/04/19 143 lb (64.9 kg)      Physical Exam Vitals and nursing note reviewed.  Constitutional:      Appearance: Normal appearance. She is not ill-appearing.  Eyes:     Extraocular Movements: Extraocular movements intact.     Pupils: Pupils are equal, round, and reactive to light.  Neck:     Thyroid: No thyroid mass or thyromegaly.     Vascular: No carotid bruit.  Cardiovascular:     Rate and Rhythm: Normal rate and regular rhythm.     Pulses: Normal pulses.     Heart sounds: Normal heart sounds. No murmur heard. Pulmonary:     Effort: Pulmonary effort is normal. No respiratory distress.     Breath sounds: Normal breath sounds. No wheezing, rhonchi or rales.  Musculoskeletal:     Cervical back: Normal range of motion and neck supple.     Right lower leg: No edema.     Left lower leg: No edema.  Skin:    General: Skin is warm and dry.     Findings: No rash.  Neurological:     Mental Status: She is alert.     Cranial Nerves: Cranial nerves are intact.     Sensory: Sensation is intact.     Motor: Motor function is intact.     Coordination: Coordination is intact. Romberg sign negative.     Gait: Gait is intact.     Comments:  CN 2-12 intact FTN intact EOMI No pronator drift  Psychiatric:        Mood and Affect: Mood normal.        Behavior: Behavior normal.      Results for orders placed or performed in visit on 12/04/19  WET PREP FOR [place], YEAST, CLUE   Specimen: Vaginal Fluid   Vaginal Flui  Result Value Ref Range   Trichomonas Exam Negative Negative   Yeast Exam Negative Negative   Clue Cell Exam Negative Negative    Assessment & Plan:  This visit occurred during the SARS-CoV-2 public health emergency.  Safety protocols were in place, including screening questions prior to the visit, additional usage of staff PPE, and extensive cleaning of exam room while observing appropriate contact time as indicated for disinfecting  solutions.   Problem List Items Addressed This Visit     History of CVA (cerebrovascular accident)    Incidental finding on MRI 2015 - small chronic R cerebellar infarct - without residual deficit, continues aspirin and statin.        Smoker    Continued smoker - ~18 PY hx, so not likely yet eligible for lung cancer screening.  Encouraged full smoking cessation, she is in action phase. chantix was only medication that was previously effective however was recalled last year. Discussed possible trial of generic varenicline pending workup.  Dyslipidemia    H/o markedly elevated triglycerides, on fibrate and statin (h/o CVA).  Gets yearly labs through [facility] - upcoming labs scheduled later this month, she will bring [place] a copy to update chart.        Relevant Medications   fenofibrate 54 MG tablet   atorvastatin (LIPITOR) 10 MG tablet   Essential tremor    On primidone and propranolol managed by neurology.        Migraine    Current regimen is propranolol and topamax preventatively, fioricet abortively. Avoid triptans in stroke history. Consider gepants like nurtec abortively pending below evaluation. Encouraged smoking cessation. See below.        Relevant Medications   fenofibrate 54 MG tablet   atorvastatin (LIPITOR) 10 MG tablet   butalbital-acetaminophen-caffeine (FIORICET) 50-325-40 MG tablet   topiramate (TOPAMAX) 50 MG tablet   aspirin 81 MG EC tablet   New daily persistent headache - Primary    1 month of change in characteristics of typical migraine headaches, describes R sided temporal pain present in the mornings, progressively worsens throughout the day and can develop into migraines with associated nausea/vomiting.  Check ESR, CRP r/o giant cell arteritis.  Increase topamax to $RemoveBe'50mg'dvthgiFBz$  nightly, update with effect. Given change in characteristics of her chronic migraines and previous stroke history, reasonable to check head CT r/o other acute cause of worsening  headaches.  Zofran refilled. Describes sleep apnea symptoms, recommend sleep evaluation - she declines for now.  Pt agrees with plan.        Relevant Medications   butalbital-acetaminophen-caffeine (FIORICET) 50-325-40 MG tablet   topiramate (TOPAMAX) 50 MG tablet   aspirin 81 MG EC tablet   Other Relevant Orders   Sedimentation rate   C-reactive protein   CT Head Wo Contrast   TSH     Meds ordered this encounter  Medications   fenofibrate 54 MG tablet    Sig: Take 1 tablet (54 mg total) by mouth daily. MUST SCHEDULE PHYSICAL    Dispense:  90 tablet    Refill:  1   atorvastatin (LIPITOR) 10 MG tablet    Sig: Take 1 tablet (10 mg total) by mouth daily. MUST SCHEDULE PHYSICAL    Dispense:  90 tablet    Refill:  1   butalbital-acetaminophen-caffeine (FIORICET) 50-325-40 MG tablet    Sig: Take 1 tablet by mouth every 6 (six) hours as needed for headache.    Dispense:  30 tablet    Refill:  0   topiramate (TOPAMAX) 50 MG tablet    Sig: Take 1 tablet (50 mg total) by mouth at bedtime.    Dispense:  90 tablet    Refill:  1   ondansetron (ZOFRAN) 4 MG tablet    Sig: Take 1 tablet (4 mg total) by mouth every 8 (eight) hours as needed.    Dispense:  20 tablet    Refill:  0   esomeprazole (NEXIUM) 20 MG capsule    Sig: Take 1 capsule (20 mg total) by mouth daily at 12 noon.    Dispense:  90 capsule    Refill:  1   aspirin 81 MG EC tablet    Sig: Take 1 tablet (81 mg total) by mouth daily. Swallow whole.    Orders Placed This Encounter  Procedures   CT Head Wo Contrast    Standing Status:   Future    Standing Expiration Date:   07/12/2021    Order Specific Question:   Is patient  pregnant?    Answer:   No    Order Specific Question:   Preferred imaging location?    Answer:   [facility] [address]   Sedimentation rate   C-reactive protein   TSH     Patient Instructions  Labs today Will check head CT as new baseline and for worsening headaches.  Will consider sleep  apnea evaluation.  Increase topamax to $RemoveBe'50mg'ByszzhTOa$  nightly, update [place] with effect. Other medicines refilled including cholesterol medicines.  Will consider generic chantix to help you quit smoking.  We will be in touch with results.   Follow up plan: Return if symptoms worsen or fail to improve.  Ria Bush, MD

## 2020-07-12 NOTE — Patient Instructions (Addendum)
Labs today Will check head CT as new baseline and for worsening headaches.  Will consider sleep apnea evaluation.  Increase topamax to 50mg  nightly, update [place] with effect. Other medicines refilled including cholesterol medicines.  Will consider generic chantix to help you quit smoking.  We will be in touch with results.

## 2020-07-13 ENCOUNTER — Encounter: Payer: Self-pay | Admitting: Family Medicine

## 2020-07-13 DIAGNOSIS — G4452 New daily persistent headache (NDPH): Secondary | ICD-10-CM | POA: Insufficient documentation

## 2020-07-13 LAB — TSH: TSH: 0.965 u[IU]/mL (ref 0.450–4.500)

## 2020-07-13 LAB — C-REACTIVE PROTEIN: CRP: 1 mg/L (ref 0–10)

## 2020-07-13 LAB — SEDIMENTATION RATE: Sed Rate: 9 mm/hr (ref 0–40)

## 2020-07-13 NOTE — Assessment & Plan Note (Signed)
H/o markedly elevated triglycerides, on fibrate and statin (h/o CVA).  Gets yearly labs through [facility] - upcoming labs scheduled later this month, she will bring [place] a copy to update chart.

## 2020-07-13 NOTE — Assessment & Plan Note (Signed)
Current regimen is propranolol and topamax preventatively, fioricet abortively. Avoid triptans in stroke history. Consider gepants like nurtec abortively pending below evaluation. Encouraged smoking cessation. See below.

## 2020-07-13 NOTE — Assessment & Plan Note (Signed)
On primidone and propranolol managed by neurology.

## 2020-07-13 NOTE — Assessment & Plan Note (Addendum)
Incidental finding on MRI 2015 - small chronic R cerebellar infarct - without residual deficit, continues aspirin and statin.

## 2020-07-13 NOTE — Assessment & Plan Note (Signed)
Continued smoker - ~18 PY hx, so not likely yet eligible for lung cancer screening.  Encouraged full smoking cessation, she is in action phase. chantix was only medication that was previously effective however was recalled last year. Discussed possible trial of generic varenicline pending workup.

## 2020-07-13 NOTE — Assessment & Plan Note (Signed)
1 month of change in characteristics of typical migraine headaches, describes R sided temporal pain present in the mornings, progressively worsens throughout the day and can develop into migraines with associated nausea/vomiting.  Check ESR, CRP r/o giant cell arteritis.  Increase topamax to $RemoveBe'50mg'MZneoSbdR$  nightly, update with effect. Given change in characteristics of her chronic migraines and previous stroke history, reasonable to check head CT r/o other acute cause of worsening headaches.  Zofran refilled. Describes sleep apnea symptoms, recommend sleep evaluation - she declines for now.  Pt agrees with plan.

## 2020-07-21 NOTE — Telephone Encounter (Signed)
Can we get head CT set up for patient for a change in characteristics of chronic headaches? Thanks

## 2020-07-30 ENCOUNTER — Other Ambulatory Visit: Payer: Managed Care, Other (non HMO)

## 2020-08-09 ENCOUNTER — Encounter: Payer: Self-pay | Admitting: Family Medicine

## 2020-08-09 ENCOUNTER — Other Ambulatory Visit: Payer: Self-pay | Admitting: Family Medicine

## 2020-08-09 DIAGNOSIS — G4452 New daily persistent headache (NDPH): Secondary | ICD-10-CM

## 2020-08-09 DIAGNOSIS — Z8673 Personal history of transient ischemic attack (TIA), and cerebral infarction without residual deficits: Secondary | ICD-10-CM

## 2020-08-09 DIAGNOSIS — G43009 Migraine without aura, not intractable, without status migrainosus: Secondary | ICD-10-CM

## 2020-08-09 NOTE — Telephone Encounter (Signed)
Name of Medication: Fioricet Name of Pharmacy: [facility] Last Fill or Written Date and Quantity: 07/12/20, #30 Last Office Visit and Type: 07/12/20, migraine Next Office Visit and Type: none Last Controlled Substance Agreement Date: none Last UDS: none

## 2020-08-10 MED ORDER — BUTALBITAL-APAP-CAFFEINE 50-325-40 MG PO TABS: 1.0000 | ORAL_TABLET | Freq: Four times a day (QID) | ORAL | 0 refills | Status: DC | PRN

## 2020-08-11 ENCOUNTER — Ambulatory Visit: Payer: Managed Care, Other (non HMO) | Admitting: Podiatry

## 2020-08-11 ENCOUNTER — Encounter: Payer: Self-pay | Admitting: Podiatry

## 2020-08-11 ENCOUNTER — Ambulatory Visit: Payer: Self-pay

## 2020-08-11 ENCOUNTER — Other Ambulatory Visit: Payer: Self-pay

## 2020-08-11 DIAGNOSIS — R2241 Localized swelling, mass and lump, right lower limb: Secondary | ICD-10-CM

## 2020-08-11 NOTE — Progress Notes (Signed)
  Subjective:  Patient ID: Kristie Miles, female    DOB: Jan 07, 1965,  MRN: [id number]  Chief Complaint  Patient presents with   Foot Pain        right foot near toes have pain x 2-3 months    55 y.o. female presents with the above complaint. History confirmed with patient.  This is been worsening over the time.  And increasing size with a mass under the toes. Objective:  Physical Exam: warm, good capillary refill, no trophic changes or ulcerative lesions, normal DP and PT pulses, and normal sensory exam. Left Foot: normal exam, no swelling, tenderness, instability; ligaments intact, full range of motion of all ankle/foot joints Right Foot: Palpable firm movable subcutaneous mass in second interspace plantarly   Radiographs: Multiple views x-ray of the right foot: no fracture, dislocation, swelling or degenerative changes noted and no osseous abnormality in the area of the mass  Assessment:   1. Subcutaneous mass of right foot      Plan:  Patient was evaluated and treated and all questions answered.   She has a palpable mass that is suspected is likely a fibroma, lipoma, ganglion cyst though this would be unlikely on the plantar foot and also a neuroma.  Particularly tender.  Unable to reproduce a Mulder's click or Tinel's sign here.  I recommended MRI to evaluate the mass and plan further treatment including possible surgical excision if indicated.  She will follow-up after the MRI.  No follow-ups on file.

## 2020-08-13 ENCOUNTER — Ambulatory Visit: Payer: Managed Care, Other (non HMO) | Admitting: Family Medicine

## 2020-08-31 ENCOUNTER — Ambulatory Visit
Admission: RE | Admit: 2020-08-31 | Discharge: 2020-08-31 | Disposition: A | Discharge status: Home / Self Care | Payer: Managed Care, Other (non HMO) | Source: Ambulatory Visit | Attending: Family Medicine | Admitting: Family Medicine

## 2020-08-31 ENCOUNTER — Ambulatory Visit
Admission: RE | Admit: 2020-08-31 | Discharge: 2020-08-31 | Disposition: A | Discharge status: Home / Self Care | Payer: Managed Care, Other (non HMO) | Source: Ambulatory Visit | Attending: Podiatry | Admitting: Podiatry

## 2020-08-31 DIAGNOSIS — R2241 Localized swelling, mass and lump, right lower limb: Secondary | ICD-10-CM

## 2020-08-31 DIAGNOSIS — Z8673 Personal history of transient ischemic attack (TIA), and cerebral infarction without residual deficits: Secondary | ICD-10-CM

## 2020-08-31 DIAGNOSIS — G4452 New daily persistent headache (NDPH): Secondary | ICD-10-CM

## 2020-08-31 DIAGNOSIS — G43009 Migraine without aura, not intractable, without status migrainosus: Secondary | ICD-10-CM

## 2020-08-31 IMAGING — MR MR HEAD W/O CM
10 series · 48 of 48 positions shown · non-contrast
Comparison: None.

CLINICAL DATA: Headache, new or worsening (Age >= 50y) new daily
persistent headache, history of stroke, history of migraines

EXAM:
MRI HEAD WITHOUT CONTRAST
TECHNIQUE: Multiplanar, multiecho pulse sequences of the brain and surrounding
structures were obtained without intravenous contrast.

[Series 2: T1 · sagittal · 5.0mm · 0.45mm/px · 3 of 21 slices shown]
[im 1/21]
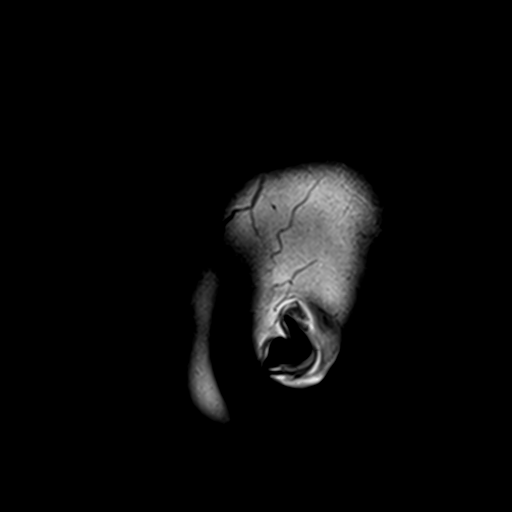
[im 11/21]
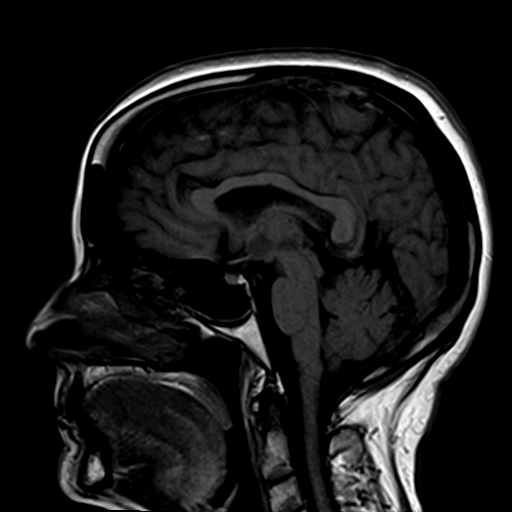
[im 21/21]
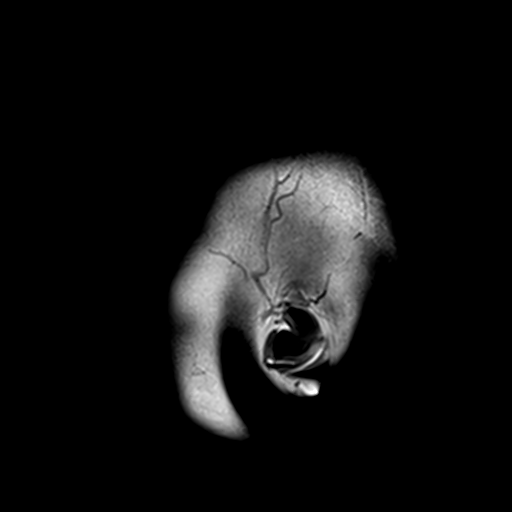

[Series 3: DWI · axial · 3.0mm · 1.80mm/px · z∈[-29,+116]mm · 9 of 100 slices shown (1 of 4)]
[im 1/100]
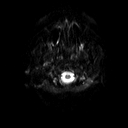
[im 13/100]
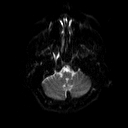
[im 25/100]
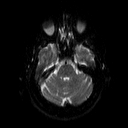
[im 38/100]
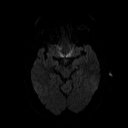
[im 50/100]
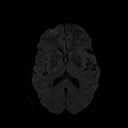
[im 62/100]
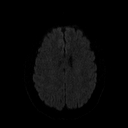
[im 75/100]
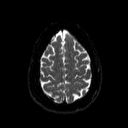
[im 87/100]
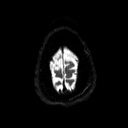
[im 100/100]
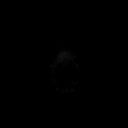

[Series 4: DWI · axial · 3.0mm · 1.80mm/px · z∈[-29,+116]mm · 4 of 49 slices shown (2 of 4)]
[im 1/49]
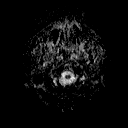
[im 17/49]
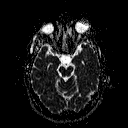
[im 33/49]
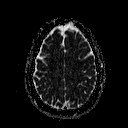
[im 49/49]
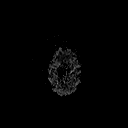

[Series 5: DWI · coronal · 5.0mm · 1.80mm/px · 6 of 67 slices shown (3 of 4)]
[im 1/67]
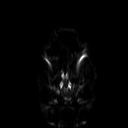
[im 14/67]
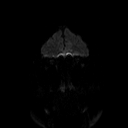
[im 27/67]
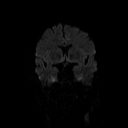
[im 40/67]
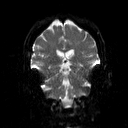
[im 53/67]
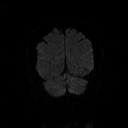
[im 67/67]
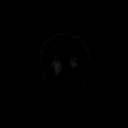

[Series 6: DWI · coronal · 5.0mm · 1.80mm/px · 3 of 34 slices shown (4 of 4)]
[im 1/34]
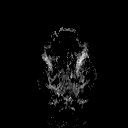
[im 17/34]
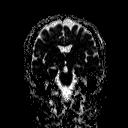
[im 34/34]
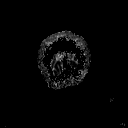

[Series 7: T2 · axial · 5.0mm · 0.51mm/px · z∈[-29,+116]mm · 2 of 22 slices shown (1 of 2)]
[im 1/22]
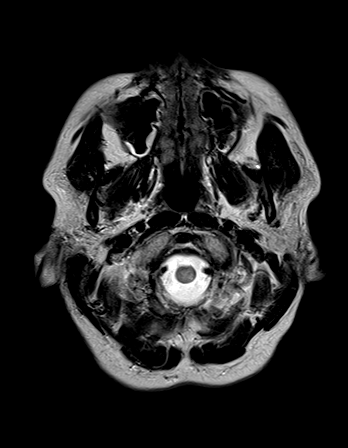
[im 22/22]
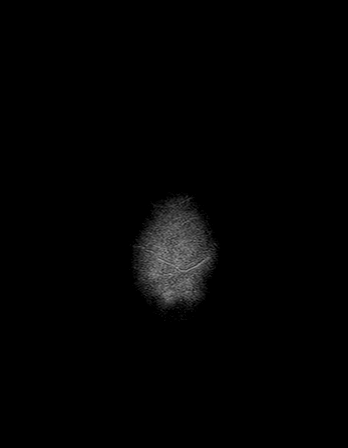

[Series 10: swi_images · axial · 4.0mm · 0.90mm/px · z∈[-26,+113]mm · 3 of 36 slices shown]
[im 1/36]
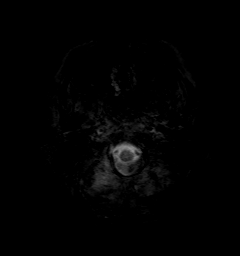
[im 18/36]
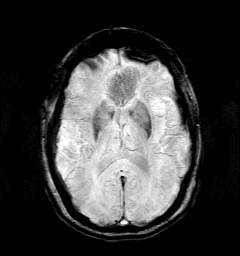
[im 36/36]
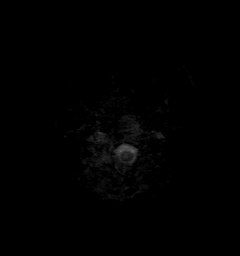

[Series 11: t1_mpr_tra · axial · 1.0mm · 0.71mm/px · z∈[-30,+111]mm · 13 of 144 slices shown]
[im 1/144]
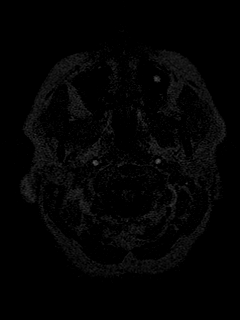
[im 12/144]
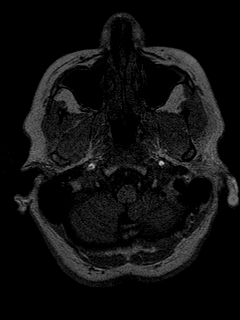
[im 24/144]
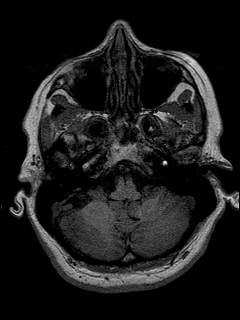
[im 36/144]
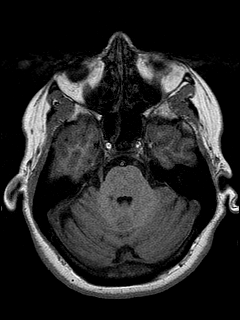
[im 48/144]
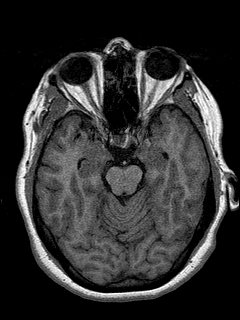
[im 60/144]
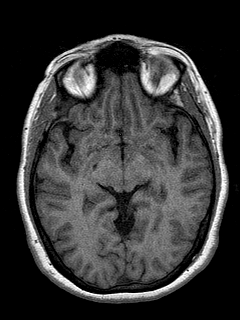
[im 72/144]
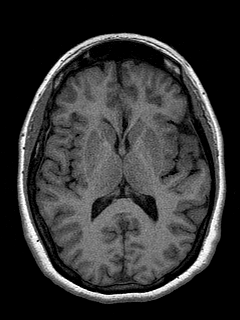
[im 84/144]
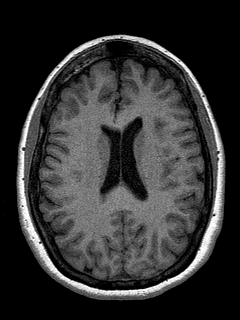
[im 96/144]
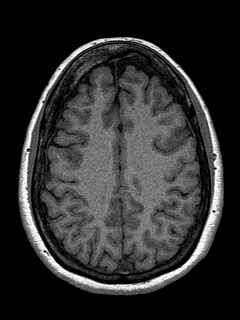
[im 108/144]
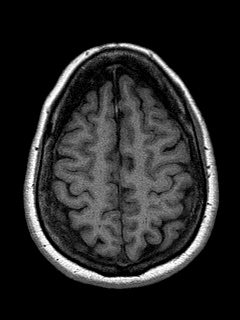
[im 120/144]
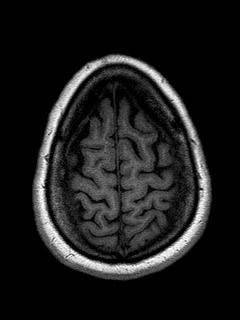
[im 132/144]
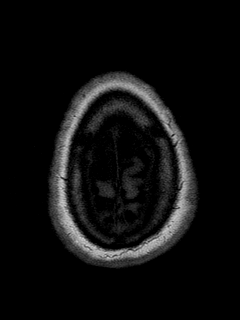
[im 144/144]
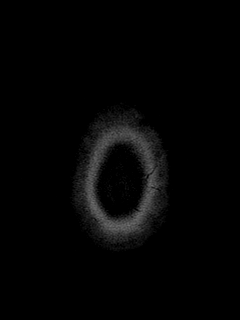

[Series 12: T2 · coronal · 5.0mm · 0.45mm/px · 2 of 25 slices shown (2 of 2)]
[im 1/25]
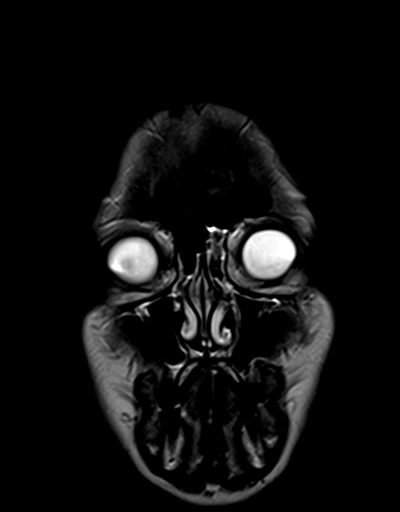
[im 25/25]
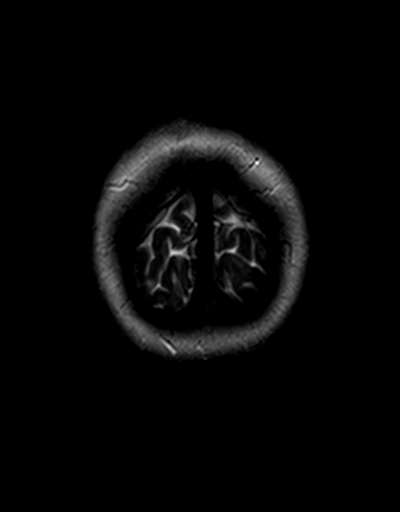

[Series 13: FLAIR · axial · 3.0mm · 0.45mm/px · z∈[-34,+113]mm · 3 of 33 slices shown]
[im 1/33]
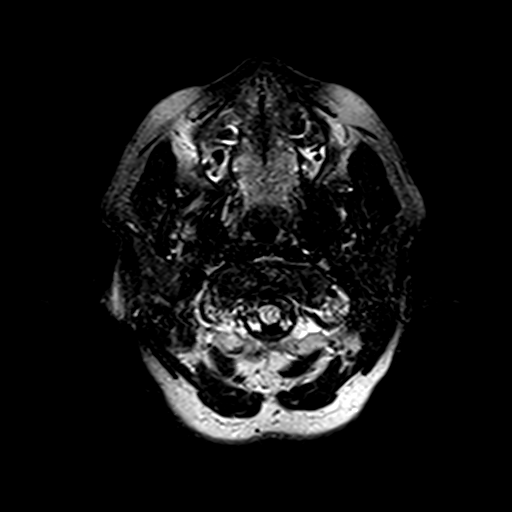
[im 17/33]
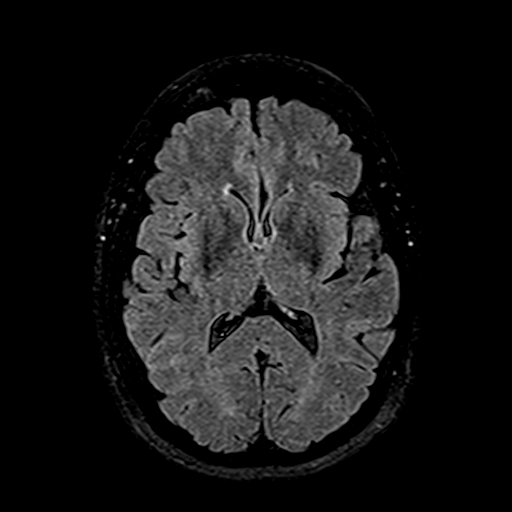
[im 33/33]
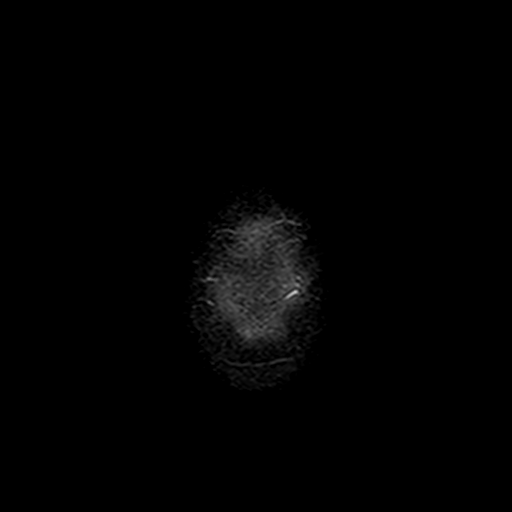

[48 of 48 positions shown; findings below may reference images not displayed]

FINDINGS: Brain: No acute infarction, hemorrhage, hydrocephalus, extra-axial
collection or mass lesion. Remote right cerebellar infarct. Mild
scattered T2 hyperintensities white matter, which are nonspecific
but compatible with chronic microvascular disease.

Vascular: Major arterial flow voids are maintained skull base

Skull and upper cervical spine: Normal marrow signal.

Sinuses/Orbits: Moderate mucosal thickening of scattered ethmoid air
cells bilateral sphenoid sinuses. Mild-to-moderate maxillary sinus
mucosal thickening partially imaged.

Other: Trace right mastoid effusion.
IMPRESSION: 1. No evidence of acute intracranial abnormality
2. Mild chronic microvascular ischemic disease and remote right
cerebellar infarct.
3. Moderate paranasal sinus mucosal.

## 2020-08-31 IMAGING — MR MR TOES*R* WO/W CM
8 of 9 series · 36 of 40 positions shown · IV contrast (multihance)
Comparison: X-ray [DATE]

CLINICAL DATA: Plantar right foot lump for several months

EXAM:
MRI OF THE RIGHT TOES WITHOUT AND WITH CONTRAST
TECHNIQUE: Multiplanar, multisequence MR imaging of the right toes was
performed both before and after administration of intravenous
contrast.
CONTRAST:  13mL MULTIHANCE GADOBENATE DIMEGLUMINE 529 MG/ML IV SOLN

[Series 9: T1 · coronal · 4.0mm · 0.44mm/px · 6 of 26 slices shown (1 of 2)]
[im 1/26]
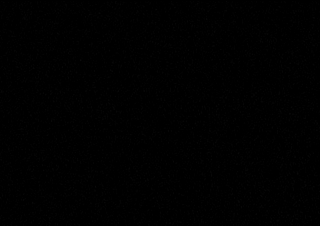
[im 6/26]
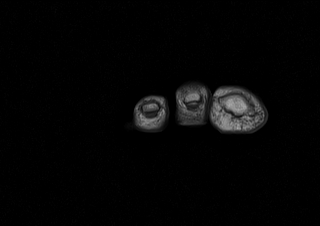
[im 11/26]
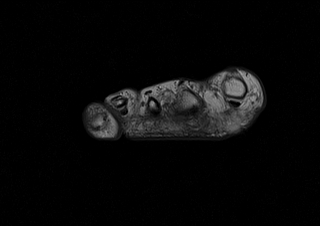
[im 16/26]
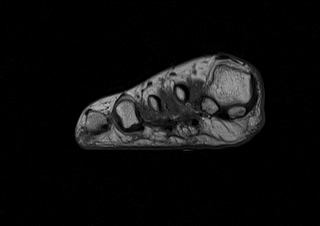
[im 21/26]
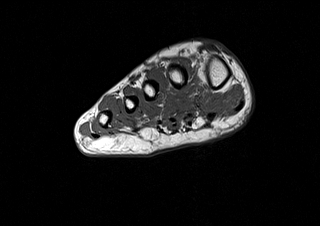
[im 26/26]
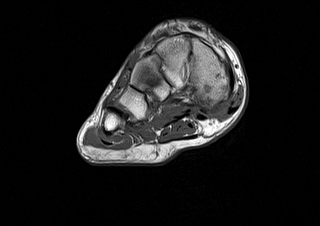

[Series 10: T2 fat-sat · coronal · 4.0mm · 0.36mm/px · 5 of 26 slices shown (1 of 2)]
[im 1/26]
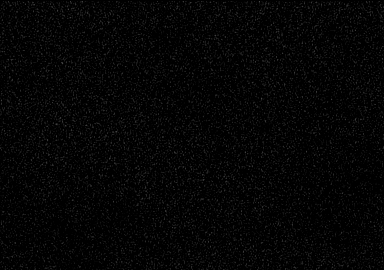
[im 7/26]
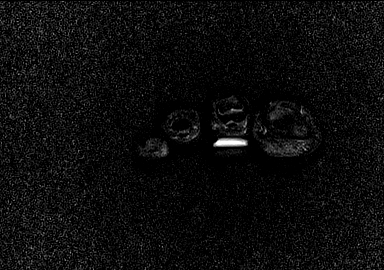
[im 13/26]
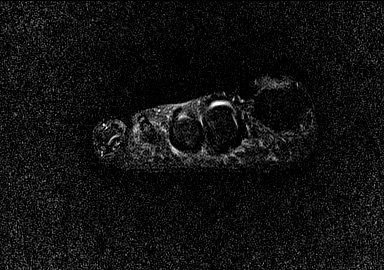
[im 19/26]
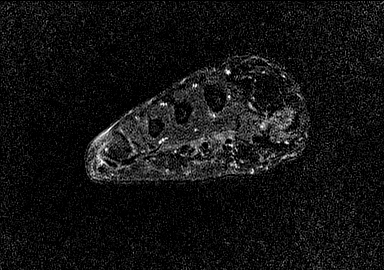
[im 26/26]
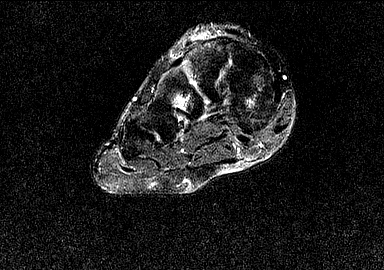

[Series 11: T1 · sagittal · 3.0mm · 0.29mm/px · 5 of 25 slices shown (2 of 2)]
[im 1/25]
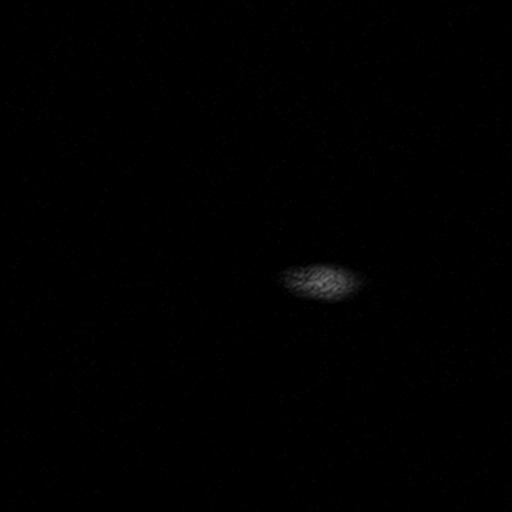
[im 7/25]
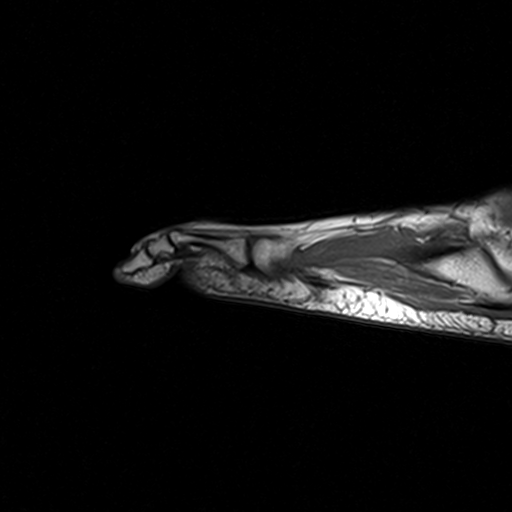
[im 13/25]
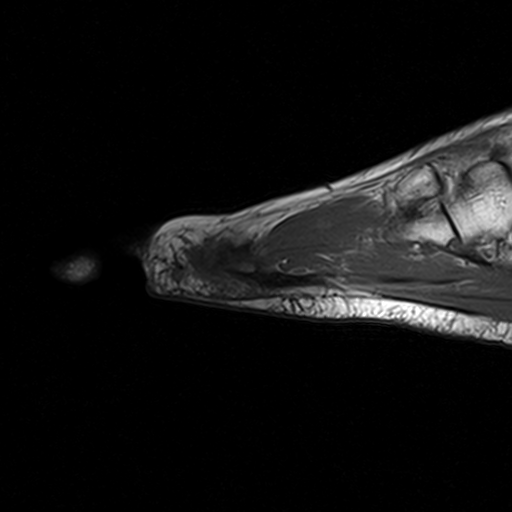
[im 19/25]
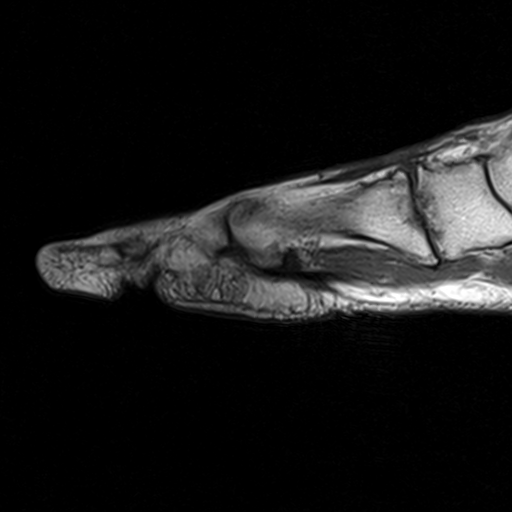
[im 25/25]
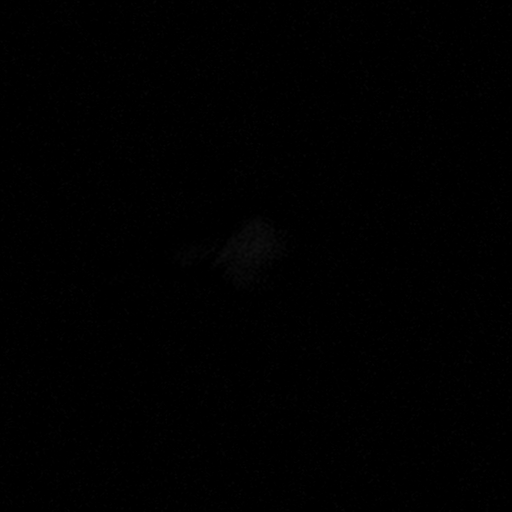

[Series 12: T2 fat-sat · sagittal · 3.0mm · 0.47mm/px · 5 of 25 slices shown (2 of 2)]
[im 1/25]
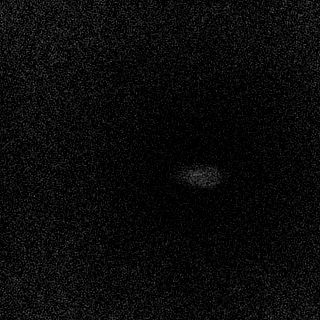
[im 7/25]
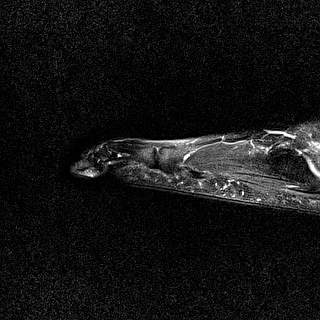
[im 13/25]
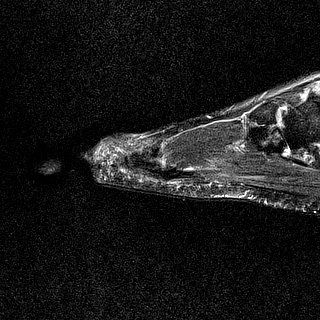
[im 19/25]
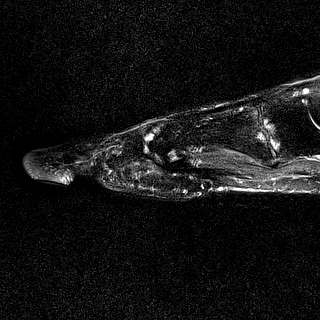
[im 25/25]
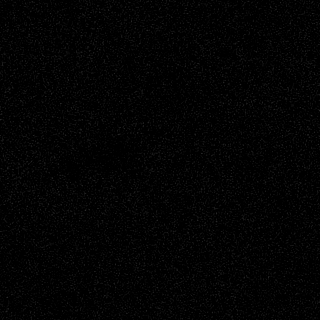

[Series 13: STIR · axial · 3.0mm · 0.62mm/px · z∈[-45,-0]mm · 3 of 15 slices shown]
[im 1/15]
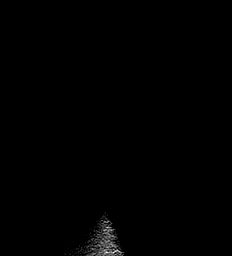
[im 8/15]
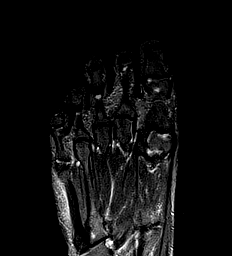
[im 15/15]
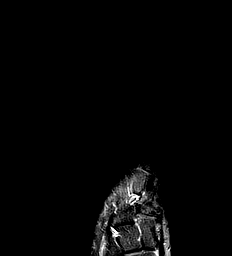

[Series 14: axial fs · coronal · 4.0mm · 0.44mm/px · 4 of 22 slices shown]
[im 1/22]
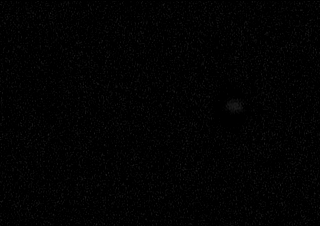
[im 8/22]
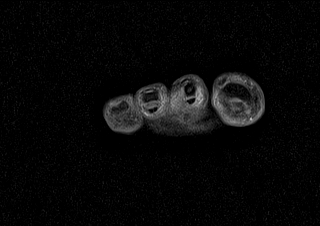
[im 15/22]
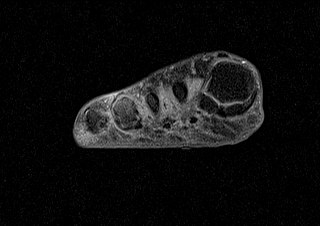
[im 22/22]
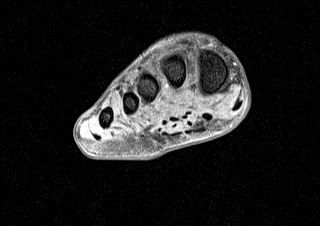

[Series 15: axial post · coronal · 4.0mm · 0.44mm/px · 4 of 22 slices shown]
[im 1/22]
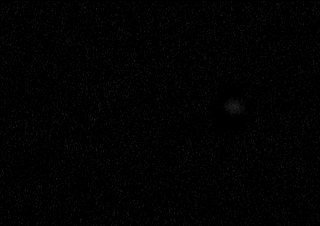
[im 8/22]
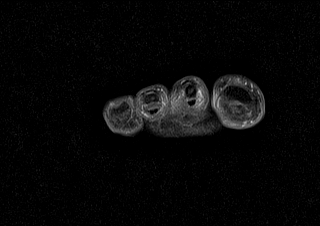
[im 15/22]
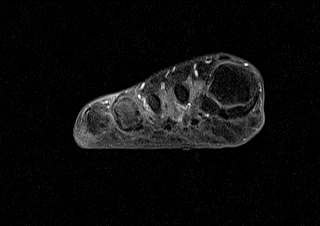
[im 22/22]
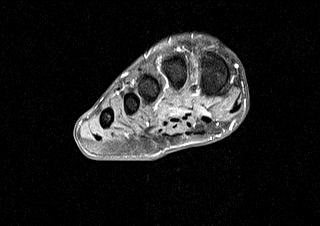

[Series 16: sag post · sagittal · 3.0mm · 0.29mm/px · 4 of 25 slices shown]
[im 1/25]
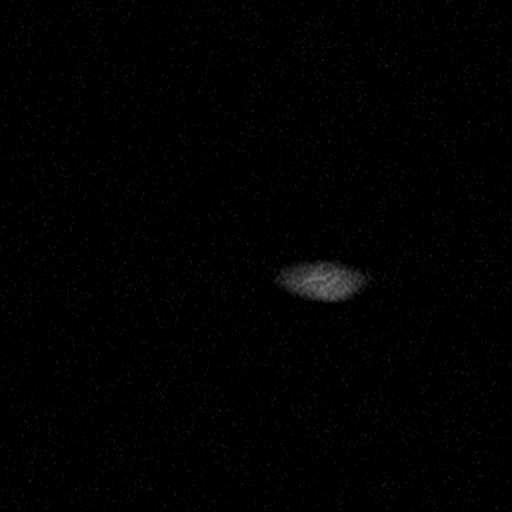
[im 7/25]
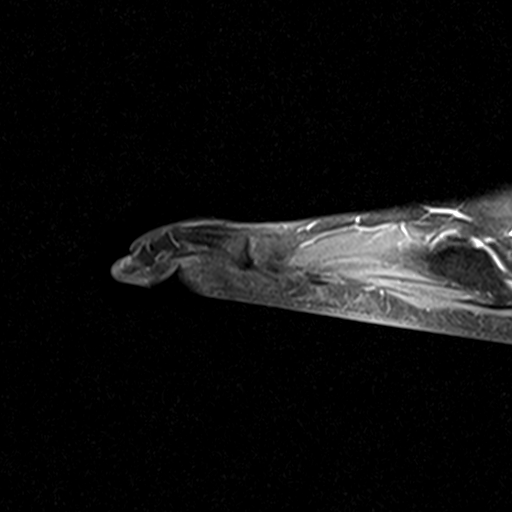
[im 13/25]
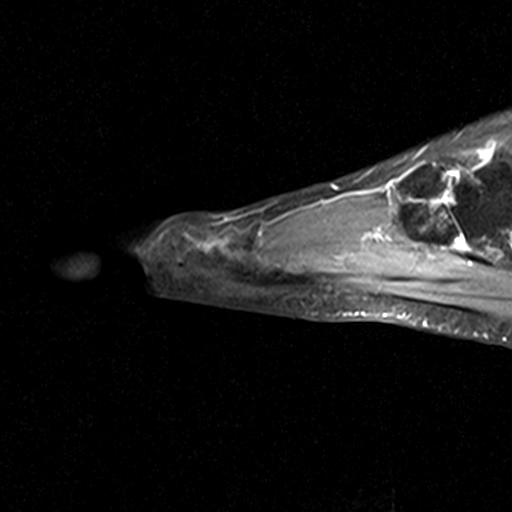
[im 19/25]
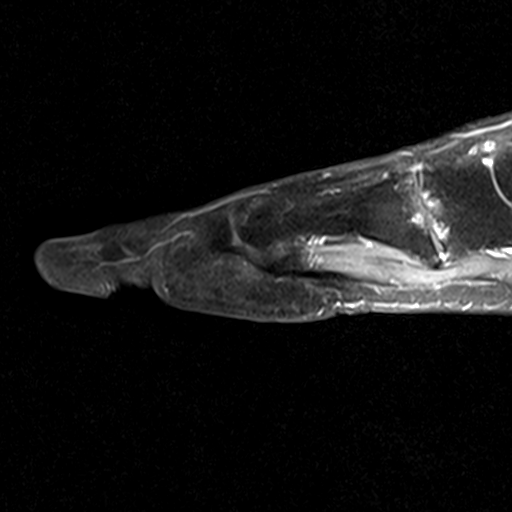

[36 of 40 positions shown; findings below may reference images not displayed]

FINDINGS: Bones/Joint/Cartilage

No acute fracture. No malalignment. Moderate osteoarthritis of the
first TMT joint with associated subchondral marrow signal changes.
Mild arthropathy is also present at the first MTP joint and third
TMT joint. Bone marrow signal is otherwise within normal limits. No
marrow edema or periostitis. No joint effusion.

Ligaments

Intact Lisfranc ligament. Collateral ligaments of forefoot appear
intact.

Muscles and Tendons

Intact flexor and extensor tendons. No tenosynovitis. Normal muscle
bulk and signal intensity without edema, atrophy, or fatty
infiltration.

Soft tissues

T1/T2 hypointense soft tissue mass within the second intermetatarsal
space which extends into the plantar soft tissues measuring
approximately 15 x 7 mm (series 9, image 15). No intermetatarsal
bursitis. No soft tissue edema or fluid collections. No ulcerations.
IMPRESSION: 1. Soft tissue mass within the second intermetatarsal space which
extends into the plantar soft tissues measuring approximately 15 x 7
mm. Findings are most consistent with a Morton's neuroma.
2. No acute osseous abnormality.
3. Moderate osteoarthritis of the first TMT joint.

## 2020-08-31 MED ORDER — GADOBENATE DIMEGLUMINE 529 MG/ML IV SOLN
13.0000 mL | Freq: Once | INTRAVENOUS | Status: AC | PRN
  Administered 2020-08-31: 13 mL via INTRAVENOUS

## 2020-09-02 ENCOUNTER — Encounter: Payer: Self-pay | Admitting: Podiatry

## 2020-09-02 ENCOUNTER — Other Ambulatory Visit: Payer: Self-pay | Admitting: Family Medicine

## 2020-09-03 ENCOUNTER — Other Ambulatory Visit: Payer: Self-pay | Admitting: Family Medicine

## 2020-09-03 ENCOUNTER — Other Ambulatory Visit: Payer: Self-pay | Admitting: Internal Medicine

## 2020-09-03 NOTE — Telephone Encounter (Signed)
Name of Medication: Fioricet Name of Pharmacy: [facility] [address] Last Fill or Written Date and Quantity: 08/10/20, #30 Last Office Visit and Type: 08/10/20, chronic HA/migraine Next Office Visit and Type: none Last Controlled Substance Agreement Date: none Last UDS: none   Zofran last rx:  07/12/20, #20

## 2020-09-03 NOTE — Telephone Encounter (Signed)
Duplicate request

## 2020-09-07 MED ORDER — NURTEC 75 MG PO TBDP: 1.0000 | ORAL_TABLET | Freq: Every day | ORAL | 1 refills | Status: DC | PRN

## 2020-09-07 NOTE — Telephone Encounter (Signed)
Erx

## 2020-09-07 NOTE — Addendum Note (Signed)
Addended by: Ria Bush on: 09/07/2020 08:05 AM   Modules accepted: Orders

## 2020-09-24 ENCOUNTER — Ambulatory Visit: Payer: Managed Care, Other (non HMO) | Admitting: Family Medicine

## 2020-09-24 ENCOUNTER — Other Ambulatory Visit: Payer: Self-pay

## 2020-09-24 ENCOUNTER — Ambulatory Visit (INDEPENDENT_AMBULATORY_CARE_PROVIDER_SITE_OTHER)
Admission: RE | Admit: 2020-09-24 | Discharge: 2020-09-24 | Disposition: A | Discharge status: Home / Self Care | Payer: Managed Care, Other (non HMO) | Source: Ambulatory Visit | Attending: Family Medicine | Admitting: Family Medicine

## 2020-09-24 ENCOUNTER — Encounter: Payer: Self-pay | Admitting: Family Medicine

## 2020-09-24 VITALS — BP 110/70 | HR 72 | Temp 97.6°F | Ht 64.0 in | Wt 134.6 lb

## 2020-09-24 DIAGNOSIS — G8929 Other chronic pain: Secondary | ICD-10-CM | POA: Diagnosis not present

## 2020-09-24 DIAGNOSIS — M5442 Lumbago with sciatica, left side: Secondary | ICD-10-CM | POA: Diagnosis not present

## 2020-09-24 DIAGNOSIS — G43009 Migraine without aura, not intractable, without status migrainosus: Secondary | ICD-10-CM

## 2020-09-24 DIAGNOSIS — K219 Gastro-esophageal reflux disease without esophagitis: Secondary | ICD-10-CM | POA: Diagnosis not present

## 2020-09-24 DIAGNOSIS — Z23 Encounter for immunization: Secondary | ICD-10-CM | POA: Diagnosis not present

## 2020-09-24 DIAGNOSIS — R7303 Prediabetes: Secondary | ICD-10-CM

## 2020-09-24 DIAGNOSIS — Z8673 Personal history of transient ischemic attack (TIA), and cerebral infarction without residual deficits: Secondary | ICD-10-CM

## 2020-09-24 DIAGNOSIS — G4452 New daily persistent headache (NDPH): Secondary | ICD-10-CM

## 2020-09-24 DIAGNOSIS — F17200 Nicotine dependence, unspecified, uncomplicated: Secondary | ICD-10-CM | POA: Diagnosis not present

## 2020-09-24 DIAGNOSIS — E785 Hyperlipidemia, unspecified: Secondary | ICD-10-CM

## 2020-09-24 IMAGING — DX DG LUMBAR SPINE COMPLETE 4+V
5 series · 5 of 5 positions shown · non-contrast
Comparison: None.

CLINICAL DATA: Lumbar spine pain with left-sided sciatica

EXAM:
LUMBAR SPINE - COMPLETE 4+ VIEW

[lumbar spine ap]
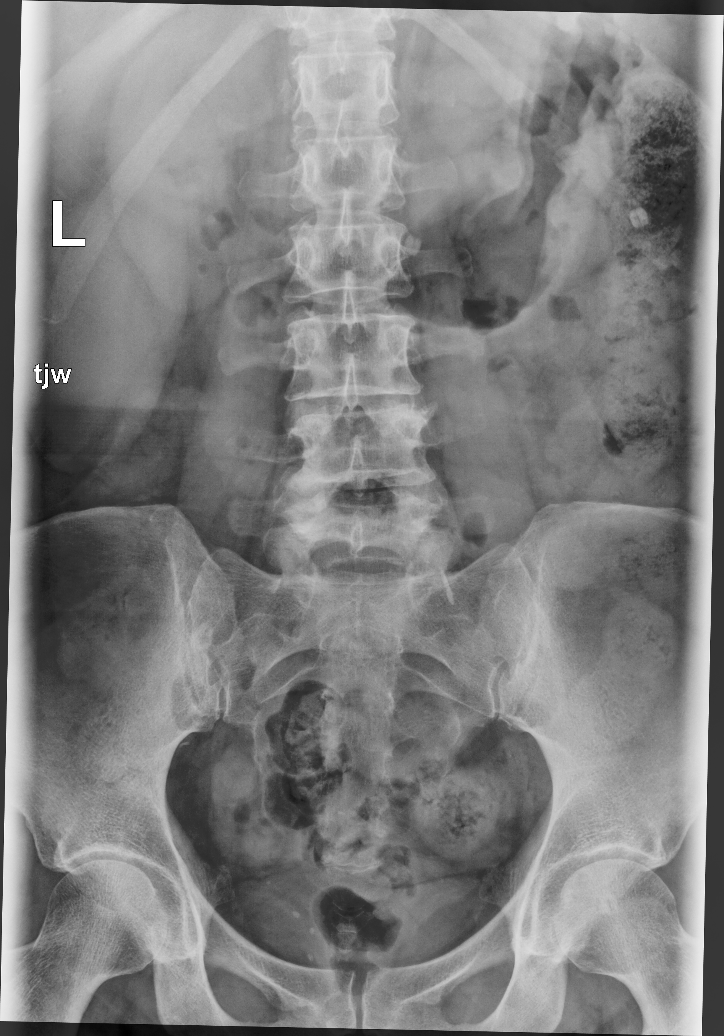

[lumbar spine lmo (1 of 2)]
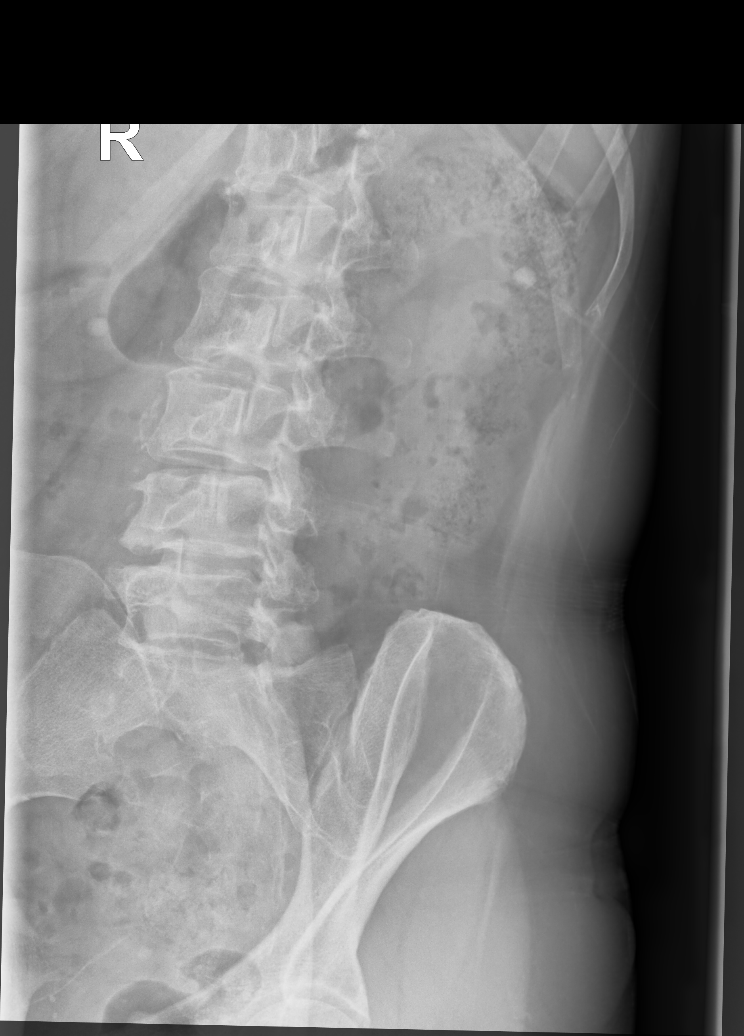

[lumbar spine lmo (2 of 2)]
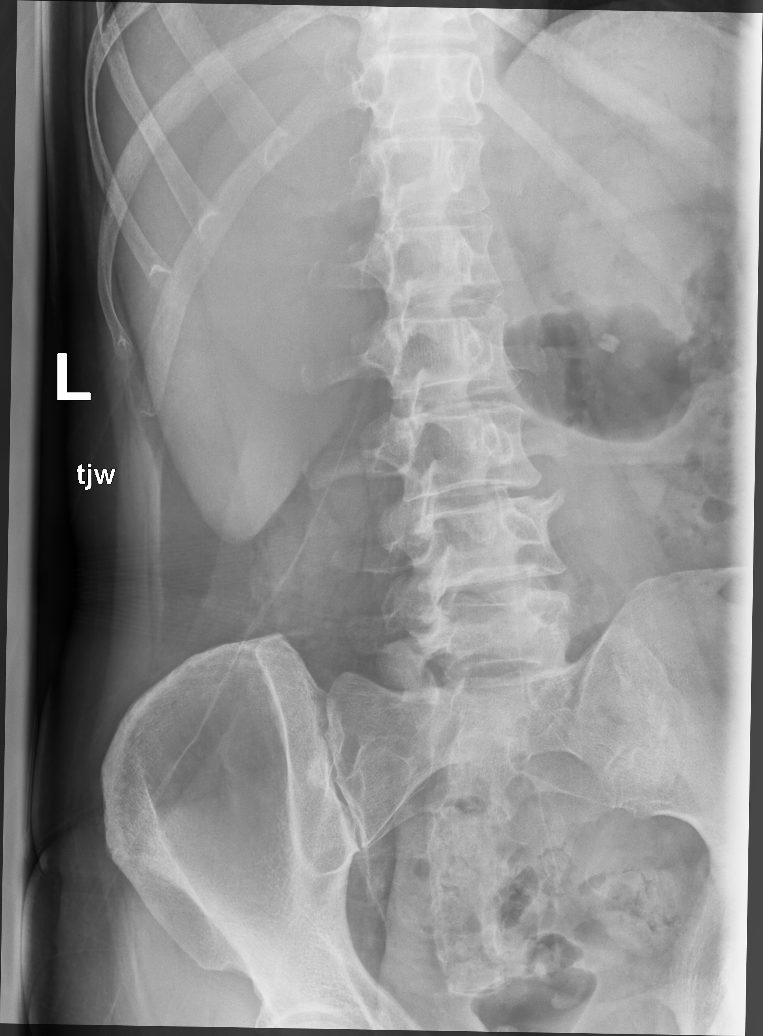

[lumbar spine lat (1 of 2)]
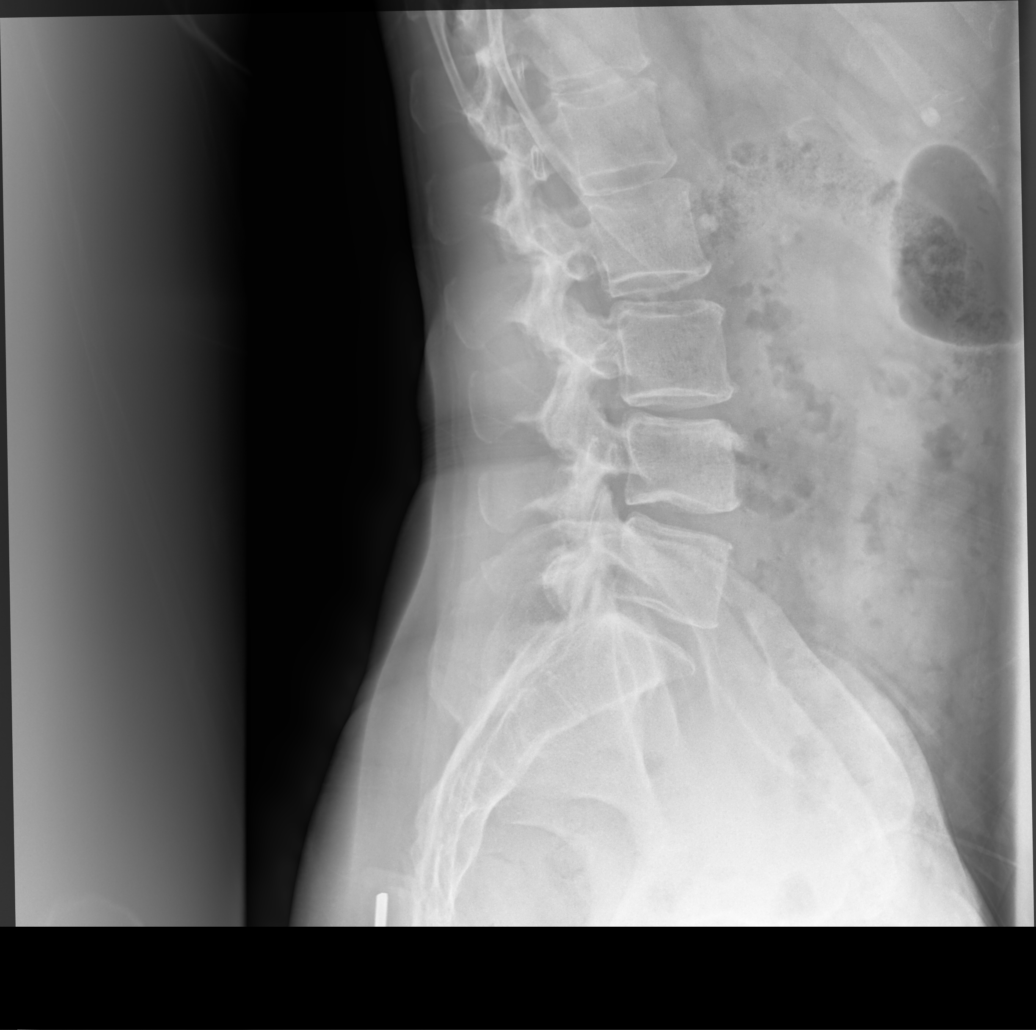

[lumbar spine lat (2 of 2)]
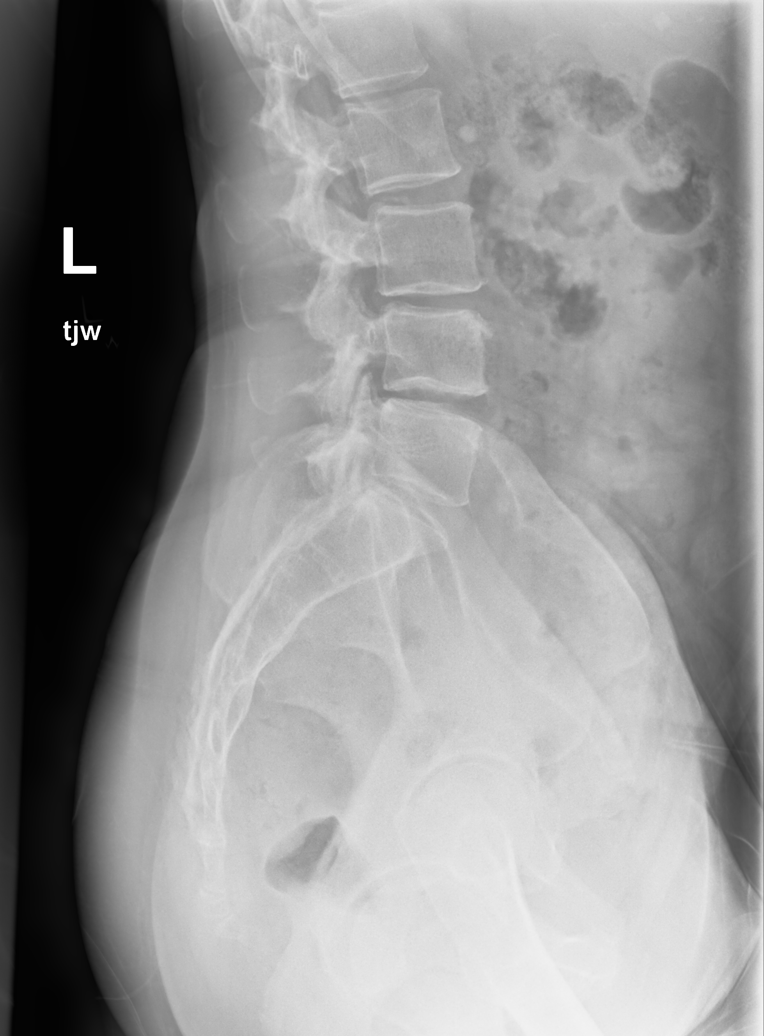

[5 of 5 positions shown; findings below may reference images not displayed]

FINDINGS: There is no evidence of lumbar spine fracture. Alignment is normal.
Intervertebral disc spaces are maintained.
IMPRESSION: Negative.

## 2020-09-24 MED ORDER — VARENICLINE TARTRATE 0.5 MG PO TABS: ORAL_TABLET | ORAL | 1 refills | Status: DC

## 2020-09-24 MED ORDER — ATORVASTATIN CALCIUM 40 MG PO TABS: 40.0000 mg | ORAL_TABLET | Freq: Every day | ORAL | 3 refills | Status: DC

## 2020-09-24 MED ORDER — FAMOTIDINE 40 MG PO TABS: 40.0000 mg | ORAL_TABLET | Freq: Every evening | ORAL | Status: DC | PRN

## 2020-09-24 MED ORDER — ESOMEPRAZOLE MAGNESIUM 40 MG PO CPDR: 40.0000 mg | DELAYED_RELEASE_CAPSULE | Freq: Every day | ORAL | 1 refills | Status: DC

## 2020-09-24 NOTE — Progress Notes (Addendum)
Patient ID: Kristie Miles, female    DOB: [birth year], 55 y.o.   MRN: [id number]  This visit was conducted in person.  BP 110/70   Pulse 72   Temp 97.6 F (36.4 C) (Temporal)   Ht 5\' 4"  (1.626 m)   Wt 134 lb 9 oz (61 kg)   SpO2 97%   BMI 23.10 kg/m    CC: several issues Subjective:   HPI: Kristie Miles is a 55 y.o. female presenting on 09/24/2020 for Back Injury (Injured low back in 02/22.  Pain has worsened and now radiates down back of left leg.), Headache (Here for f/u of HA.  Also, pt provided a copy [to keep] or recent labs.), Gastroesophageal Reflux (C/o heartburn is worsening.  Takes Nexium. ), and Nicotine Dependence (Wants to discuss medication to stop smoking. )   DOI: 02/2020 Injured lower back after move in February, no specific injury noted. Ongoing lower back pain since then, progressively worsening. Pain starts L lower back with radiation down back of leg down to knee - burning shooting pain. Pain wakes her up at night time when trying to roll over in bed. No numbness/weakness, bowel/bladder incontinence, fevers. She has previously received spine injections into the back. Thinks she's previously had lumbar MRI, no records available.   Weight loss of 15 lbs attributed to stress.   Chronic daily headaches in known migraines - see prior notes for details. Workup included normal TSH, inflammatory markers, and MRI head (mild chronic microvascular ischemic disease, remote R cerebellar infarct). She also had symptoms of sleep apnea - declines referral at this time. Have treated with topamax 50mg  nightly as well as fioricet abortively. Avoiding triptans in h/o CVA. Last visit we started nurtec - notes benefit with this, has only taken about 3 times. Still can wake up with headaches. Notes increased stressors as triggers, as well as barometric pressure changes. + photo/phonophobia, nausea with headaches. Currently having 1 bad migraine per week, other headaches  intermittently.  Smoking - interested in assistance for cessation. Continued 1/2 ppd smoker for 38 yrs. Chantix was previously effective.   GERD - longstanding history of reflux, managed with prilosec then nexium 20mg  BID for 5+ years. Nexium overall effective but notes breakthrough symptoms despite taking this. No dysphagia, early satiety, abdominal pain or unexpected weight loss. Has not previously had EGD  Brings labs showing TC 269, LDL 142, trig 426. Glu 119, A1c 6%, Cr 0.74.  Dyslipidemia - continues atorvastatin 10mg  daily and fenofibrate 54mg  daily. Good diet - strong fmhx cholesterol - anticipate familial.      Relevant past medical, surgical, family and social history reviewed and updated as indicated. Interim medical history since our last visit reviewed. Allergies and medications reviewed and updated. Outpatient Medications Prior to Visit  Medication Sig Dispense Refill   aspirin 81 MG EC tablet Take 1 tablet (81 mg total) by mouth daily. Swallow whole.     butalbital-acetaminophen-caffeine (FIORICET) 50-325-40 MG tablet TAKE 1 TABLET BY MOUTH EVERY 6 HOURS AS NEEDED FOR HEADACHE. 30 tablet 0   conjugated estrogens (PREMARIN) vaginal cream Place 1 Applicatorful vaginally 3 (three) times a week. 42.5 g 12   Flaxseed, Linseed, (FLAX SEEDS PO) Take by mouth.     gentamicin cream (GARAMYCIN) 0.1 % Apply 1 application topically 2 (two) times daily. 30 g 1   Nutritional Supplements (ESTROVEN PO) Take by mouth.     ondansetron (ZOFRAN) 4 MG tablet TAKE 1 TABLET BY MOUTH EVERY 8 HOURS AS NEEDED  20 tablet 0   PARoxetine (PAXIL) 40 MG tablet TAKE 1 TABLET(40 MG) BY MOUTH EVERY MORNING 90 tablet 2   primidone (MYSOLINE) 50 MG tablet TAKE 2 TABLETS BY MOUTH AT  BEDTIME 180 tablet 3   propranolol ER (INDERAL LA) 160 MG SR capsule TAKE 1 CAPSULE BY MOUTH  DAILY 90 capsule 3   Rhubarb (ESTROVEN MENOPAUSE RELIEF) 4 MG TABS Take by mouth.     Rimegepant Sulfate (NURTEC) 75 MG TBDP Take 1 tablet  by mouth daily as needed (migraine). 15 tablet 1   topiramate (TOPAMAX) 50 MG tablet Take 1 tablet (50 mg total) by mouth at bedtime. 90 tablet 1   valACYclovir (VALTREX) 500 MG tablet TAKE 1 TABLET BY MOUTH TWICE A DAY AS NEEDED 180 tablet 1   atorvastatin (LIPITOR) 10 MG tablet Take 1 tablet (10 mg total) by mouth daily. MUST SCHEDULE PHYSICAL 90 tablet 1   esomeprazole (NEXIUM) 20 MG capsule Take 1 capsule (20 mg total) by mouth daily at 12 noon. 90 capsule 1   fenofibrate 54 MG tablet Take 1 tablet (54 mg total) by mouth daily. MUST SCHEDULE PHYSICAL 90 tablet 1   No facility-administered medications prior to visit.     Per HPI unless specifically indicated in ROS section below Review of Systems  Objective:  BP 110/70   Pulse 72   Temp 97.6 F (36.4 C) (Temporal)   Ht 5\' 4"  (1.626 m)   Wt 134 lb 9 oz (61 kg)   SpO2 97%   BMI 23.10 kg/m   Wt Readings from Last 3 Encounters:  09/24/20 134 lb 9 oz (61 kg)  07/12/20 140 lb (63.5 kg)  02/19/20 145 lb (65.8 kg)      Physical Exam Vitals and nursing note reviewed.  Constitutional:      Appearance: Normal appearance. She is not ill-appearing.  Eyes:     Extraocular Movements: Extraocular movements intact.     Pupils: Pupils are equal, round, and reactive to light.  Cardiovascular:     Rate and Rhythm: Normal rate and regular rhythm.     Pulses: Normal pulses.     Heart sounds: Normal heart sounds. No murmur heard. Pulmonary:     Effort: Pulmonary effort is normal. No respiratory distress.     Breath sounds: Normal breath sounds. No wheezing, rhonchi or rales.  Abdominal:     General: Abdomen is flat. Bowel sounds are normal. There is no distension.     Palpations: Abdomen is soft. There is no mass.     Tenderness: There is no abdominal tenderness. There is no right CVA tenderness, left CVA tenderness, guarding or rebound.     Hernia: No hernia is present.  Musculoskeletal:        General: Normal range of motion.      Right lower leg: No edema.     Left lower leg: No edema.     Comments:  No pain midline spine No paraspinous mm tenderness Neg SLR bilaterally. No pain with int/ext rotation at hip. Neg FABER. No pain at SIJ, GTB or sciatic notch bilaterally.   Skin:    General: Skin is warm and dry.     Findings: No rash.  Neurological:     Mental Status: She is alert.     Sensory: Sensation is intact.     Motor: Motor function is intact.     Coordination: Coordination is intact.     Deep Tendon Reflexes:     Reflex Scores:  Patellar reflexes are 2+ on the right side and 2+ on the left side.    Comments:  2+ DTR patellar 5/5 strength BLE   Psychiatric:        Mood and Affect: Mood normal.        Behavior: Behavior normal.      Results for orders placed or performed in visit on 07/12/20  Sedimentation rate  Result Value Ref Range   Sed Rate 9 0 - 40 mm/hr  C-reactive protein  Result Value Ref Range   CRP 1 0 - 10 mg/L  TSH  Result Value Ref Range   TSH 0.965 0.450 - 4.500 uIU/mL   DG Lumbar Spine Complete CLINICAL DATA:  Lumbar spine pain with left-sided sciatica  EXAM: LUMBAR SPINE - COMPLETE 4+ VIEW  COMPARISON:  None.  FINDINGS: There is no evidence of lumbar spine fracture. Alignment is normal. Intervertebral disc spaces are maintained.  IMPRESSION: Negative.  Electronically Signed   By: Jacqulynn Cadet M.D.   On: 09/26/2020 08:14   Assessment & Plan:  This visit occurred during the SARS-CoV-2 public health emergency.  Safety protocols were in place, including screening questions prior to the visit, additional usage of staff PPE, and extensive cleaning of exam room while observing appropriate contact time as indicated for disinfecting solutions.   Problem List Items Addressed This Visit     History of CVA (cerebrovascular accident)    Continue aspirin, see below for statin change      GERD (gastroesophageal reflux disease)    Longstanding, on nexium 20mg   BID with breakthrough symptoms. On PPI for >5 yrs. Will change PPI to nexium 40mg  once daily, with PRN pepcid at night. Given duration of GERD, will refer to GI to discuss possible EGD. No red flags.       Relevant Medications   esomeprazole (NEXIUM) 40 MG capsule   famotidine (PEPCID) 40 MG tablet   Other Relevant Orders   Ambulatory referral to Gastroenterology   Smoker    Continued smoker, ~18 PY hx.  Has previously quit successfully with chantix until it was removed from market. She did note nausea to full dose however. Will restart chantix at 0.5mg  daily x1 wk then increase to BID dosing for 2-3 months.  Reviewed possible side effects on chantix.       Dyslipidemia    Anticipate familial given elevated LDL on statin, as well as h/o markedly elevated triglycerides. Will stop fibrate, increase statin to high intensity statin treatment with atorvastatin 40mg  daily. Recheck levels in 3 months when she returns for CPE.  The 10-year ASCVD risk score (Arnett DK, et al., 2019) is: 6.2%   Values used to calculate the score:     Age: 48 years     Sex: Female     Is Non-Hispanic African American: No     Diabetic: No     Tobacco smoker: Yes     Systolic Blood Pressure: 623 mmHg     Is BP treated: No     HDL Cholesterol: 37.8 mg/dL     Total Cholesterol: 233 mg/dL       Relevant Medications   atorvastatin (LIPITOR) 40 MG tablet   Chronic low back pain - Primary    Chronic low back pain for 7 months, acutely worsening recently.  Overall benign physical exam today.  Check baseline lumbar films today. Will refer to PT. Pending results/effect of PT, discussed possible MRI and return to spine clinic to discuss rpt epidural injection.  Relevant Orders   DG Lumbar Spine Complete (Completed)   Ambulatory referral to Physical Therapy   Migraine    Ongoing migraines, ~1 bad one per week.  nurtec has been beneficial however given duration, would benefit from different preventative  medication.  Weight loss noted - ?topamax anorexic effect.  Has tried beta blocker, antidepressant, and anticonvulsant.  Discussed possible Ajovy vs Aimovig vs Emgality - may need neurology input for these medications due to insurance protocol.       Relevant Medications   atorvastatin (LIPITOR) 40 MG tablet   New daily persistent headache    Completed recent reassuring workup      Prediabetes    Reviewed recent A1c encouraged limiting added sugars and watching simple carbs in diet.      Other Visit Diagnoses     Need for influenza vaccination       Relevant Orders   Flu Vaccine QUAD 17mo+IM (Fluarix, Fluzone & Alfiuria Quad PF) (Completed)        Meds ordered this encounter  Medications   varenicline (CHANTIX) 0.5 MG tablet    Sig: Take 1 tablet (0.5 mg total) by mouth daily for 7 days, THEN 1 tablet (0.5 mg total) 2 (two) times daily.    Dispense:  60 tablet    Refill:  1   esomeprazole (NEXIUM) 40 MG capsule    Sig: Take 1 capsule (40 mg total) by mouth daily at 12 noon.    Dispense:  90 capsule    Refill:  1    Note new dose   famotidine (PEPCID) 40 MG tablet    Sig: Take 1 tablet (40 mg total) by mouth at bedtime as needed for heartburn or indigestion.   DISCONTD: atorvastatin (LIPITOR) 40 MG tablet    Sig: Take 1 tablet (40 mg total) by mouth daily.    Dispense:  90 tablet    Refill:  3   atorvastatin (LIPITOR) 40 MG tablet    Sig: Take 1 tablet (40 mg total) by mouth daily.    Dispense:  90 tablet    Refill:  3    In place of atorva 10mg  and fenofibrate 54mg    Orders Placed This Encounter  Procedures   DG Lumbar Spine Complete    Standing Status:   Future    Number of Occurrences:   1    Standing Expiration Date:   09/24/2021    Order Specific Question:   Reason for Exam (SYMPTOM  OR DIAGNOSIS REQUIRED)    Answer:   L lower back pain with L sciatica    Order Specific Question:   Is patient pregnant?    Answer:   No    Order Specific Question:    Preferred imaging location?    Answer:   [facility]   Flu Vaccine QUAD 40mo+IM (Fluarix, Fluzone & Alfiuria Quad PF)   Ambulatory referral to Gastroenterology    Referral Priority:   Routine    Referral Type:   Consultation    Referral Reason:   Specialty Services Required    Number of Visits Requested:   1   Ambulatory referral to Physical Therapy    Referral Priority:   Routine    Referral Type:   Physical Medicine    Referral Reason:   Specialty Services Required    Requested Specialty:   Physical Therapy    Number of Visits Requested:   1     Patient Instructions  Let's try lower dose chantix -  take 0.5mg  daily for 1 week then increase to 0.5mg  twice daily and continue this for 2-3 months.  Continue nurtec as needed. Consider monthly injection like Ajovy - let me know if interested in trying this in place of topamax.  Increase nexium to 40mg  daily, may add pepcid 40mg  at night over the counter as needed for breakthrough symptoms. Given duration of heartburn, we will refer you to GI to discuss endoscopy.  Cholesterol staying too high - stop fenofibrate, increase atorvastatin to 40mg  daily. Schedule labs in 3-4 months to recheck cholesterol. Increase legumes.  For back - xrays today, we will refer you to PT course. We may refer you to spine doctor to consider repeat injections if no better with above.   Follow up plan: Return in about 3 months (around 12/24/2020), or if symptoms worsen or fail to improve, for annual exam, prior fasting for blood work.  Ria Bush, MD

## 2020-09-24 NOTE — Patient Instructions (Addendum)
Let's try lower dose chantix - take 0.5mg  daily for 1 week then increase to 0.5mg  twice daily and continue this for 2-3 months.  Continue nurtec as needed. Consider monthly injection like Ajovy - let me know if interested in trying this in place of topamax.  Increase nexium to 40mg  daily, may add pepcid 40mg  at night over the counter as needed for breakthrough symptoms. Given duration of heartburn, we will refer you to GI to discuss endoscopy.  Cholesterol staying too high - stop fenofibrate, increase atorvastatin to 40mg  daily. Schedule labs in 3-4 months to recheck cholesterol. Increase legumes.  For back - xrays today, we will refer you to PT course. We may refer you to spine doctor to consider repeat injections if no better with above.

## 2020-09-25 DIAGNOSIS — R7303 Prediabetes: Secondary | ICD-10-CM | POA: Insufficient documentation

## 2020-09-25 NOTE — Assessment & Plan Note (Signed)
Longstanding, on nexium 20mg  BID with breakthrough symptoms. On PPI for >5 yrs. Will change PPI to nexium 40mg  once daily, with PRN pepcid at night. Given duration of GERD, will refer to GI to discuss possible EGD. No red flags.

## 2020-09-25 NOTE — Assessment & Plan Note (Signed)
Completed recent reassuring workup

## 2020-09-25 NOTE — Assessment & Plan Note (Signed)
Continue aspirin, see below for statin change

## 2020-09-25 NOTE — Assessment & Plan Note (Signed)
Anticipate familial given elevated LDL on statin, as well as h/o markedly elevated triglycerides. Will stop fibrate, increase statin to high intensity statin treatment with atorvastatin 40mg  daily. Recheck levels in 3 months when she returns for CPE.  The 10-year ASCVD risk score (Arnett DK, et al., 2019) is: 6.2%   Values used to calculate the score:     Age: 55 years     Sex: Female     Is Non-Hispanic African American: No     Diabetic: No     Tobacco smoker: Yes     Systolic Blood Pressure: 511 mmHg     Is BP treated: No     HDL Cholesterol: 37.8 mg/dL     Total Cholesterol: 233 mg/dL

## 2020-09-25 NOTE — Assessment & Plan Note (Addendum)
Chronic low back pain for 7 months, acutely worsening recently.  Overall benign physical exam today.  Check baseline lumbar films today. Will refer to PT. Pending results/effect of PT, discussed possible MRI and return to spine clinic to discuss rpt epidural injection.

## 2020-09-25 NOTE — Assessment & Plan Note (Signed)
Reviewed recent A1c encouraged limiting added sugars and watching simple carbs in diet.

## 2020-09-25 NOTE — Assessment & Plan Note (Signed)
Continued smoker, ~18 PY hx.  Has previously quit successfully with chantix until it was removed from market. She did note nausea to full dose however. Will restart chantix at 0.5mg  daily x1 wk then increase to BID dosing for 2-3 months.  Reviewed possible side effects on chantix.

## 2020-09-25 NOTE — Assessment & Plan Note (Signed)
Ongoing migraines, ~1 bad one per week.  nurtec has been beneficial however given duration, would benefit from different preventative medication.  Weight loss noted - ?topamax anorexic effect.  Has tried beta blocker, antidepressant, and anticonvulsant.  Discussed possible Ajovy vs Aimovig vs Emgality - may need neurology input for these medications due to insurance protocol.

## 2020-09-26 ENCOUNTER — Other Ambulatory Visit: Payer: Self-pay | Admitting: Family Medicine

## 2020-09-27 ENCOUNTER — Telehealth: Payer: Self-pay

## 2020-09-27 MED ORDER — BUTALBITAL-APAP-CAFFEINE 50-325-40 MG PO TABS
1.0000 | ORAL_TABLET | Freq: Three times a day (TID) | ORAL | 0 refills | Status: DC | PRN
Start: 2020-09-27 — End: 2020-10-22

## 2020-09-27 NOTE — Telephone Encounter (Signed)
Pt had OV on 09/24/20 and provided a copy of recent lab work.  However, it's missing the date they were done.    Lvm asking pt to call back.  Need to know when the labs were done.  [Lab results in basket on Lisa's desk pending response from pt.]

## 2020-09-27 NOTE — Telephone Encounter (Signed)
Refill request Fioricet Last office visit 09/24/20 Upcoming appointment 01/31/21 Last refill 09/07/20 #30

## 2020-09-27 NOTE — Telephone Encounter (Signed)
ERx 

## 2020-09-27 NOTE — Addendum Note (Signed)
Addended by: Ria Bush on: 09/27/2020 07:43 AM   Modules accepted: Orders

## 2020-09-28 NOTE — Telephone Encounter (Signed)
Pt had OV on 09/24/20 and provided a copy of recent lab work.  However, it's missing the date they were done.    Lvm asking pt to call back.  Need to know when the labs were done.  [Lab results in basket on Lisa's desk pending response from pt.]

## 2020-09-29 ENCOUNTER — Encounter: Payer: Self-pay | Admitting: Family Medicine

## 2020-09-29 NOTE — Telephone Encounter (Signed)
Spoke with pt asking when the labs were done for the results she provided.  States they were done on 06/28/20.    I documented date and abstracted results.

## 2020-10-02 DIAGNOSIS — G5760 Lesion of plantar nerve, unspecified lower limb: Secondary | ICD-10-CM

## 2020-10-02 HISTORY — DX: Lesion of plantar nerve, unspecified lower limb: G57.60

## 2020-10-11 ENCOUNTER — Other Ambulatory Visit: Payer: Self-pay

## 2020-10-11 ENCOUNTER — Ambulatory Visit: Payer: Managed Care, Other (non HMO) | Admitting: Podiatry

## 2020-10-11 DIAGNOSIS — G5761 Lesion of plantar nerve, right lower limb: Secondary | ICD-10-CM | POA: Diagnosis not present

## 2020-10-11 MED ORDER — HYDROCODONE-ACETAMINOPHEN 5-325 MG PO TABS
1.0000 | ORAL_TABLET | Freq: Four times a day (QID) | ORAL | 0 refills | Status: DC | PRN
Start: 2020-10-11 — End: 2020-11-04

## 2020-10-13 ENCOUNTER — Telehealth: Payer: Self-pay | Admitting: Urology

## 2020-10-13 NOTE — Telephone Encounter (Signed)
DOS - 11/04/20  NEURECTOMY 2ND RIGHT --- 50093   [employer] EFFECTIVE DATE - 01/03/19  PER [employer]'S AUTOMATIVE SYSTEM FOR CPT CODE 81829 NO PRIOR AUTH IS REQUIRED.   REF# [id number]

## 2020-10-15 ENCOUNTER — Other Ambulatory Visit: Payer: Self-pay | Admitting: Adult Health

## 2020-10-18 ENCOUNTER — Telehealth: Payer: Self-pay | Admitting: Family Medicine

## 2020-10-18 ENCOUNTER — Other Ambulatory Visit: Payer: Self-pay | Admitting: Family Medicine

## 2020-10-18 NOTE — Telephone Encounter (Signed)
Dea from [facility] called following up on an authorization that was sent Callback number [phone]

## 2020-10-18 NOTE — Telephone Encounter (Signed)
Spoke to [employer] and was advised that I do not see anything in our records where a PA was requested. Dea stated that they sent the request over electronically  on 09/09/20. Dea was given our fax number and she stated that she will fax the paperwork again today.

## 2020-10-18 NOTE — Telephone Encounter (Signed)
Refill request Chantix Last refill 09/24/20 #60/1 Last office visit 09/24/20 Upcoming appointment 01/31/21

## 2020-10-20 NOTE — Telephone Encounter (Signed)
Submitted PA for Nurtec 75 mg TBDP; key: [id number].  Decision pending.

## 2020-10-22 ENCOUNTER — Other Ambulatory Visit: Payer: Self-pay | Admitting: Family Medicine

## 2020-10-22 NOTE — Telephone Encounter (Signed)
ERx 

## 2020-10-22 NOTE — Telephone Encounter (Signed)
Name of Medication: Fioricet Name of Pharmacy: [facility] Last Fill or Written Date and Quantity: 09/27/20, #30 Last Office Visit and Type: 09/24/20, HA; back pain Next Office Visit and Type: 01/31/21, CPE Last Controlled Substance Agreement Date: none Last UDS: none

## 2020-10-22 NOTE — Telephone Encounter (Signed)
Duplicate request

## 2020-10-24 NOTE — Progress Notes (Signed)
   HPI: 55 y.o. female presenting today for follow-up evaluation of a Morton's neuroma to the right second interspace which is very symptomatic and painful.  Is been painful for several months now.  MRI was ordered last visit with Dr. Sherryle Lis.  She presents for further treatment and evaluation  Past Medical History:  Diagnosis Date   Allergy    Asthma    GERD (gastroesophageal reflux disease)    Hyperlipidemia    Smoking      Physical Exam: General: The patient is alert and oriented x3 in no acute distress.  Dermatology: Skin is warm, dry and supple bilateral lower extremities. Negative for open lesions or macerations.  Vascular: Palpable pedal pulses bilaterally. No edema or erythema noted. Capillary refill within normal limits.  Neurological: Epicritic and protective threshold grossly intact bilaterally.   Musculoskeletal Exam: There continues to be some tenderness to palpation to the second interspace of the right foot  MR toes RT w/wo contrast 08/31/2020: IMPRESSION: 1. Soft tissue mass within the second intermetatarsal space which extends into the plantar soft tissues measuring approximately 15 x 7 mm. 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 2. No acute osseous abnormality. 3. 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  Assessment: 1.  Symptomatic Morton's neuroma right second interspace   Plan of Care:  1. Patient evaluated.  MRI reviewed.  2. Today we discussed the conservative versus surgical management of the presenting pathology. The patient opts for surgical management. All possible complications and details of the procedure were explained. All patient questions were answered. No guarantees were expressed or implied. 3. Authorization for surgery was initiated today. Surgery will consist of excision of Morton's neuroma right second interspace 4.  Prescription for Vicodin 5/325 mg 5.  Return to clinic 1 week postop     Edrick Kins, DPM [facility]  Dr. Edrick Kins, DPM    [address],  [address]                Office [phone]  Fax [phone]

## 2020-10-26 ENCOUNTER — Encounter: Payer: Self-pay | Admitting: Family Medicine

## 2020-10-28 ENCOUNTER — Encounter: Payer: Self-pay | Admitting: Family Medicine

## 2020-11-02 ENCOUNTER — Other Ambulatory Visit: Payer: Self-pay | Admitting: Family Medicine

## 2020-11-02 NOTE — Telephone Encounter (Signed)
Refill request Zofran Last office visit 09/24/20 Last refill 09/07/20 #30

## 2020-11-03 DIAGNOSIS — M79676 Pain in unspecified toe(s): Secondary | ICD-10-CM

## 2020-11-04 ENCOUNTER — Encounter: Payer: Self-pay | Admitting: Podiatry

## 2020-11-04 ENCOUNTER — Other Ambulatory Visit: Payer: Self-pay | Admitting: Podiatry

## 2020-11-04 DIAGNOSIS — G5761 Lesion of plantar nerve, right lower limb: Secondary | ICD-10-CM | POA: Diagnosis not present

## 2020-11-04 HISTORY — PX: NEURECTOMY FOOT: SUR888

## 2020-11-04 HISTORY — PX: FOOT SURGERY: SHX648

## 2020-11-04 MED ORDER — IBUPROFEN 800 MG PO TABS: 800.0000 mg | ORAL_TABLET | Freq: Three times a day (TID) | ORAL | 1 refills | Status: DC

## 2020-11-04 MED ORDER — OXYCODONE-ACETAMINOPHEN 5-325 MG PO TABS
1.0000 | ORAL_TABLET | ORAL | 0 refills | Status: DC | PRN
Start: 2020-11-04 — End: 2021-01-31

## 2020-11-04 MED ORDER — ONDANSETRON HCL 4 MG PO TABS: 4.0000 mg | ORAL_TABLET | Freq: Three times a day (TID) | ORAL | 0 refills | Status: DC | PRN

## 2020-11-04 NOTE — Telephone Encounter (Signed)
Looks like PA was denied due to additional requested info not received.  Received new faxed PA request; key:  [id number].  Decision pending.

## 2020-11-04 NOTE — Progress Notes (Signed)
PRN postop 

## 2020-11-09 NOTE — Telephone Encounter (Signed)
Faxed form.  Decision pending.  

## 2020-11-09 NOTE — Telephone Encounter (Signed)
Filled and in Lisa's box 

## 2020-11-09 NOTE — Telephone Encounter (Signed)
Received faxed PA form from [facility].  Placed in Dr. Synthia Innocent box.

## 2020-11-10 ENCOUNTER — Telehealth: Payer: Self-pay

## 2020-11-10 ENCOUNTER — Other Ambulatory Visit: Payer: Self-pay

## 2020-11-10 ENCOUNTER — Ambulatory Visit (INDEPENDENT_AMBULATORY_CARE_PROVIDER_SITE_OTHER): Payer: Managed Care, Other (non HMO) | Admitting: Podiatry

## 2020-11-10 DIAGNOSIS — Z9889 Other specified postprocedural states: Secondary | ICD-10-CM

## 2020-11-10 NOTE — Telephone Encounter (Signed)
[place] Night - Client TELEPHONE ADVICE RECORD [facility] Patient Name: Kristie Miles Gender: Unknown DOB: [birth year] Age: 56 Y 17 M 10 D Return Phone Number: [phone] (Primary) Address: City/ State/ Zip: [place]  [address] Client [place] Night - Client Client Site [place] Physician Ria Bush - MD Contact Type Call Who Is Calling Pharmacy Call Type Pharmacy Send to RN Chief Complaint Medication Question (non symptomatic) Reason for Call Request to clarify medication order Initial Comment Caller states she is calling regarding patient and needing verification on medication order. Pharmacy Name [facility] Pharmacist Name My ( pronounced "Me") Pharmacy Number [phone] Translation No Disp. Time Eilene Ghazi Time) Disposition Final User 11/09/2020 11:09:36 PM Pharmacy Call [place], RN, [place] Reason: Spoke with [facility]. See quick note 11/09/2020 11:09:47 PM Clinical Call Yes [place], RN, [place] Comments User: Danielle Rankin, RN Date/Time Eilene Ghazi Time): 11/09/2020 11:09:14 PM [facility] regarding medication clarification. [facility] did send fax to drs office to clarify medication with the prescribing dr. Joylene Igo shows as recieved. [facility] will wait to hear back from dr. Fletcher Anon did attempt to call pt to speak with her about her prescription. [facility] unable to reach pt

## 2020-11-10 NOTE — Progress Notes (Signed)
   Subjective:  Patient presents today status post Morton's neurectomy right foot. DOS: 11/04/2020.  Patient states that she is doing very well.  She has been weightbearing in cam boot as instructed.  No new complaints at this time.  Pain is tolerable.  Past Medical History:  Diagnosis Date   Allergy    Asthma    GERD (gastroesophageal reflux disease)    Hyperlipidemia    Morton's neuroma 10/2020   followed by podiatry   Smoking       Objective/Physical Exam Neurovascular status intact.  Skin incisions appear to be well coapted with sutures intact. No sign of infectious process noted. No dehiscence. No active bleeding noted.  Negative for any significant edema noted to the surgical extremity.   Assessment: 1. s/p Morton's neurectomy right foot. DOS: 11/04/2020   Plan of Care:  1. Patient was evaluated.  2.  Dressings changed.  Overall the incision site and the foot appear to be healing nicely 3.  Continue weightbearing in the cam boot x1 week 4.  Return to clinic in 1 week for suture removal   Edrick Kins, DPM [facility]  Dr. Edrick Kins, DPM    [address], [place] [address]                Office [phone]  Fax [phone]

## 2020-11-11 ENCOUNTER — Encounter: Payer: Self-pay | Admitting: Podiatry

## 2020-11-11 NOTE — Telephone Encounter (Signed)
Returned call to [facility] and the recording states Nurtec PA was approved.  Made pharmacy aware.

## 2020-11-11 NOTE — Telephone Encounter (Signed)
Please advise 

## 2020-11-12 ENCOUNTER — Other Ambulatory Visit: Payer: Self-pay | Admitting: Podiatry

## 2020-11-12 DIAGNOSIS — M79676 Pain in unspecified toe(s): Secondary | ICD-10-CM

## 2020-11-12 MED ORDER — HYDROCODONE-ACETAMINOPHEN 5-325 MG PO TABS: 1.0000 | ORAL_TABLET | Freq: Four times a day (QID) | ORAL | 0 refills | Status: DC | PRN

## 2020-11-12 NOTE — Progress Notes (Signed)
As needed postop 

## 2020-11-15 ENCOUNTER — Other Ambulatory Visit: Payer: Self-pay | Admitting: Family Medicine

## 2020-11-15 NOTE — Telephone Encounter (Signed)
ERx 

## 2020-11-15 NOTE — Telephone Encounter (Signed)
Refill request Chantix Last refill 10/22/20 #60/1 Last office visit 09/24/20

## 2020-11-17 ENCOUNTER — Ambulatory Visit (INDEPENDENT_AMBULATORY_CARE_PROVIDER_SITE_OTHER): Payer: Managed Care, Other (non HMO) | Admitting: Podiatry

## 2020-11-17 ENCOUNTER — Other Ambulatory Visit: Payer: Self-pay

## 2020-11-17 DIAGNOSIS — Z9889 Other specified postprocedural states: Secondary | ICD-10-CM

## 2020-11-19 NOTE — Telephone Encounter (Signed)
Can you clarify? I received message from [facility] dated 11/09/2020 that nurtec was denied but I see below where it was approved? I placed forms in Lisa's box. Has pt been able to fill Nurtec?  ppx treatments tried: nortriptyline, topamax, propranolol Abortive treatments tried: triptans (imitrex, maxalt - now contraindicated in stroke hx), fioricet, tylenol and ibuprofen (limited due to [facility]) Currently on propranolol ER (tremors) and topamax preventatively and fioricet anortively

## 2020-11-19 NOTE — Telephone Encounter (Signed)
Received faxed PA denial.  Reason: See letter in pt's chart.

## 2020-11-23 ENCOUNTER — Other Ambulatory Visit: Payer: Self-pay | Admitting: Family Medicine

## 2020-11-24 MED ORDER — BUTALBITAL-APAP-CAFFEINE 50-325-40 MG PO TABS: 1.0000 | ORAL_TABLET | Freq: Three times a day (TID) | ORAL | 0 refills | Status: DC | PRN

## 2020-11-24 NOTE — Telephone Encounter (Signed)
Printed PA approval [from [facility]], valid 11/04/2020- 05/04/2021.

## 2020-11-24 NOTE — Telephone Encounter (Signed)
ERx 

## 2020-11-24 NOTE — Telephone Encounter (Addendum)
According to [facility], PA was approved.    Spoke with [facility] asking if rx was picked up.  States they had it ready but pt never picked it up.  Med returned to shelf.

## 2020-11-24 NOTE — Telephone Encounter (Signed)
LAST OV - 09/24/2020 NEXT OV - 01/31/2021 LAST FILLED - 10/22/2020

## 2020-11-25 ENCOUNTER — Telehealth: Payer: Self-pay | Admitting: Adult Health

## 2020-11-25 NOTE — Progress Notes (Signed)
   Subjective:  Patient presents today status post Morton's neurectomy right foot. DOS: 11/04/2020.  Patient states that she is doing very well.  She continues to weight-bear in the cam boot.  No new complaints at this time Past Medical History:  Diagnosis Date   Allergy    Asthma    GERD (gastroesophageal reflux disease)    Hyperlipidemia    Morton's neuroma 10/2020   followed by podiatry   Smoking       Objective/Physical Exam Neurovascular status intact.  Skin incisions appear to be well coapted with sutures intact. No sign of infectious process noted. No dehiscence. No active bleeding noted.  Negative for any significant edema noted to the surgical extremity.   Assessment: 1. s/p Morton's neurectomy right foot. DOS: 11/04/2020   Plan of Care:  1. Patient was evaluated.  2.  Sutures removed today 3.  Patient may return to work 11/22/2020.  Note provided with restrictions 3 01/03/2021 4.  Patient may slowly transition into good supportive shoes and sneakers 5.  Return to clinic in 4 weeks   Edrick Kins, DPM [facility]  Dr. Edrick Kins, DPM    [address], [place] [address]                Office [phone]  Fax [phone]

## 2020-11-29 ENCOUNTER — Encounter: Payer: Managed Care, Other (non HMO) | Admitting: Podiatry

## 2020-11-30 NOTE — Telephone Encounter (Signed)
Pt was called back.  She accepted office visit with Jinny Blossom, NP on 11-30, she is aware of 10:30 check in for 11:00 appointment.  This is FYI no call back requested

## 2020-11-30 NOTE — Telephone Encounter (Signed)
Pt returned phone call, would like a call back.  

## 2020-11-30 NOTE — Telephone Encounter (Signed)
Pt hasn't been seen since September 2021. She needs a follow-up. I called her and LVM asking for call back ASAP. We can provide a one month refill but pt must be seen.

## 2020-12-01 ENCOUNTER — Encounter: Payer: Self-pay | Admitting: Adult Health

## 2020-12-01 ENCOUNTER — Ambulatory Visit: Payer: Managed Care, Other (non HMO) | Admitting: Adult Health

## 2020-12-01 VITALS — BP 118/78 | HR 57 | Ht 64.0 in | Wt 133.6 lb

## 2020-12-01 DIAGNOSIS — G250 Essential tremor: Secondary | ICD-10-CM

## 2020-12-01 MED ORDER — PROPRANOLOL HCL ER 160 MG PO CP24: 160.0000 mg | ORAL_CAPSULE | Freq: Every day | ORAL | 3 refills | Status: DC

## 2020-12-01 MED ORDER — PRIMIDONE 50 MG PO TABS: 100.0000 mg | ORAL_TABLET | Freq: Every day | ORAL | 3 refills | Status: DC

## 2020-12-01 NOTE — Progress Notes (Signed)
PATIENT: Kristie Miles DOB: 1965-11-05  REASON FOR VISIT: follow up HISTORY FROM: patient  HISTORY OF PRESENT ILLNESS: Today 12/01/20:  Kristie Miles is a 55 year old female with a history of essential tremor.  She returns today for follow-up.  She is currently on primidone and propranolol.  She states that these medications are working well for her.  She states that her stress level has also improved.  She denies any new symptoms.  She returns today for an evaluation.  09/24/19: Kristie Miles is a 55 year old female with a history of essential tremor.  She returns today for follow-up.  Overall she feels that she is doing well on primidone and propranolol.  She did stop smoking 6 weeks ago using Chantix.  She stopped Chantix approximately 1 week ago.  She feels that her tremors have improved since she stopped smoking.  Subsequently she states that her headaches have gotten worse.  This is in the past been managed by PCP.  At the last visit she reported that she was recently started on nortriptyline however she does not recall why she is no longer taking this.  She returns today for an evaluation.  HISTORY 03/10/19:   Kristie Miles is a 55 year old female with a history of essential tremor.  She returns today for follow-up.  She continues on primidone and propranolol.  She feels that the combination of these medications have been beneficial.  She states that she notices the tremor in both upper extremities.  She notices that with her handwriting and holding a glass.  She also states that she cannot pain her fingernails.  She also notes that she has migraine headaches.  This is currently being managed by her primary care provider.  Notes that she has had a daily headache for quite some time.  Was recently started on nortriptyline 10 mg at bedtime.  Also states that she typically takes Tylenol or ibuprofen on a daily basis.  REVIEW OF SYSTEMS: Out of a complete 14 system review of symptoms, the patient  complains only of the following symptoms, and all other reviewed systems are negative.  See HPI  ALLERGIES: Allergies  Allergen Reactions   Other     Other reaction(s): rash/itching   Latex Itching   Sulfa Antibiotics Rash    HOME MEDICATIONS: Outpatient Medications Prior to Visit  Medication Sig Dispense Refill   aspirin 81 MG EC tablet Take 1 tablet (81 mg total) by mouth daily. Swallow whole.     atorvastatin (LIPITOR) 40 MG tablet Take 1 tablet (40 mg total) by mouth daily. 90 tablet 3   butalbital-acetaminophen-caffeine (FIORICET) 50-325-40 MG tablet Take 1 tablet by mouth 3 (three) times daily as needed for migraine. 30 tablet 0   Cholecalciferol (VITAMIN D3 PO) Take by mouth daily.     conjugated estrogens (PREMARIN) vaginal cream Place 1 Applicatorful vaginally 3 (three) times a week. 42.5 g 12   Cyanocobalamin (B-12 PO) Take by mouth daily.     esomeprazole (NEXIUM) 40 MG capsule Take 1 capsule (40 mg total) by mouth daily at 12 noon. 90 capsule 1   famotidine (PEPCID) 40 MG tablet Take 1 tablet (40 mg total) by mouth at bedtime as needed for heartburn or indigestion.     Flaxseed, Linseed, (FLAX SEEDS PO) Take by mouth.     Multiple Vitamins-Minerals (CENTRUM PO) Take by mouth daily.     Nutritional Supplements (ESTROVEN PO) Take by mouth.     ondansetron (ZOFRAN) 4 MG tablet Take  1 tablet (4 mg total) by mouth every 8 (eight) hours as needed. 20 tablet 0   PARoxetine (PAXIL) 40 MG tablet TAKE 1 TABLET(40 MG) BY MOUTH EVERY MORNING 90 tablet 2   primidone (MYSOLINE) 50 MG tablet Take 2 tablets (100 mg total) by mouth at bedtime. MUST be seen for further refills. Call [phone] to schedule. 60 tablet 0   propranolol ER (INDERAL LA) 160 MG SR capsule TAKE 1 CAPSULE BY MOUTH  DAILY 90 capsule 0   Rhubarb (ESTROVEN MENOPAUSE RELIEF) 4 MG TABS Take by mouth.     Rimegepant Sulfate (NURTEC) 75 MG TBDP Take 1 tablet by mouth daily as needed (migraine). 15 tablet 1   topiramate  (TOPAMAX) 50 MG tablet Take 1 tablet (50 mg total) by mouth at bedtime. 90 tablet 1   valACYclovir (VALTREX) 500 MG tablet TAKE 1 TABLET BY MOUTH TWICE A DAY AS NEEDED 180 tablet 1   varenicline (CHANTIX) 0.5 MG tablet TAKE 1 TABLET BY MOUTH 2 TIMES DAILY. 60 tablet 0   gentamicin cream (GARAMYCIN) 0.1 % Apply 1 application topically 2 (two) times daily. 30 g 1   HYDROcodone-acetaminophen (NORCO/VICODIN) 5-325 MG tablet Take 1 tablet by mouth every 6 (six) hours as needed for moderate pain. 30 tablet 0   ibuprofen (ADVIL) 800 MG tablet Take 1 tablet (800 mg total) by mouth 3 (three) times daily. 90 tablet 1   oxyCODONE-acetaminophen (PERCOCET) 5-325 MG tablet Take 1 tablet by mouth every 4 (four) hours as needed for severe pain. 30 tablet 0   No facility-administered medications prior to visit.    PAST MEDICAL HISTORY: Past Medical History:  Diagnosis Date   Allergy    Asthma    GERD (gastroesophageal reflux disease)    Hyperlipidemia    Morton's neuroma 10/2020   followed by podiatry   Smoking     PAST SURGICAL HISTORY: Past Surgical History:  Procedure Laterality Date   FOOT SURGERY Right 11/04/2020    FAMILY HISTORY: Family History  Problem Relation Age of Onset   Arthritis Mother    Hyperlipidemia Mother    Hypertension Mother    Tremor Mother    Asthma Father    Kidney cancer Father    Kidney disease Father    Alcohol abuse Maternal Grandmother    Arthritis Maternal Grandmother    Heart disease Maternal Grandfather    Arthritis Paternal Grandmother    Asthma Paternal Grandmother     SOCIAL HISTORY: Social History   Socioeconomic History   Marital status: Married    Spouse name: Not on file   Number of children: Not on file   Years of education: Not on file   Highest education level: Not on file  Occupational History   Not on file  Tobacco Use   Smoking status: Some Days    Packs/day: 0.25    Types: Cigarettes   Smokeless tobacco: Never   Tobacco  comments:    less then 1/2 a pack a day.  prev able to tolerate chantix  Substance and Sexual Activity   Alcohol use: Yes    Comment: social drinker   Drug use: No   Sexual activity: Not on file  Other Topics Concern   Not on file  Social History Narrative   Not on file   Social Determinants of Health   Financial Resource Strain: Not on file  Food Insecurity: Not on file  Transportation Needs: Not on file  Physical Activity: Not on file  Stress:  Not on file  Social Connections: Not on file  Intimate Partner Violence: Not on file      PHYSICAL EXAM  Vitals:   12/01/20 1100  BP: 118/78  Pulse: (!) 57  Weight: 133 lb 9.6 oz (60.6 kg)  Height: 5\' 4"  (1.626 m)   Body mass index is 22.93 kg/m.  Generalized: Well developed, in no acute distress   Neurological examination  Mentation: Alert oriented to time, place, history taking. Follows all commands speech and language fluent Cranial nerve II-XII: Pupils were equal round reactive to light. Extraocular movements were full, visual field were full on confrontational test.  Head turning and shoulder shrug  were normal and symmetric. Motor: The motor testing reveals 5 over 5 strength of all 4 extremities. Good symmetric motor tone is noted throughout.  Sensory: Sensory testing is intact to soft touch on all 4 extremities. No evidence of extinction is noted.  Coordination: Cerebellar testing reveals good finger-nose-finger and heel-to-shin bilaterally.  Intention tremor noted in the upper extremities left greater than right Gait and station: Gait is normal.  DIAGNOSTIC DATA (LABS, IMAGING, TESTING) - I reviewed patient records, labs, notes, testing and imaging myself where available.  Lab Results  Component Value Date   WBC 7.1 06/28/2019   HGB 13.4 06/28/2019   HCT 39.2 06/28/2019   MCV 95.1 06/28/2019   PLT 324 06/28/2019      Component Value Date/Time   NA 137 06/28/2019 1710   K 4.1 06/28/2019 1710   CL 103  06/28/2019 1710   CO2 24 06/28/2019 1710   GLUCOSE 114 (H) 06/28/2019 1710   BUN 17 06/28/2019 1710   CREATININE 0.7 06/28/2020 0000   CREATININE 0.61 06/28/2019 1710   CALCIUM 9.5 06/28/2019 1710   PROT 6.4 04/25/2019 0859   ALBUMIN 4.3 04/25/2019 0859   AST 39 (H) 04/25/2019 0859   ALT 44 (H) 04/25/2019 0859   ALKPHOS 82 04/25/2019 0859   BILITOT 0.4 04/25/2019 0859   GFRNONAA >60 06/28/2019 1710   GFRAA >60 06/28/2019 1710   Lab Results  Component Value Date   CHOL 269 (A) 06/28/2020   HDL 48 06/28/2020   LDLCALC 142 06/28/2020   LDLDIRECT 91.0 04/25/2019   TRIG 426 (A) 06/28/2020   CHOLHDL 6 04/25/2019      ASSESSMENT AND PLAN 55 y.o. year old female  has a past medical history of Allergy, Asthma, GERD (gastroesophageal reflux disease), Hyperlipidemia, Morton's neuroma (10/2020), and Smoking. here with:  1.  Essential tremor  Continue primidone 100 mg at bedtime  Continue propranolol ER 160 mg daily She will follow-up in 6 months or sooner if needed.    Ward Givens, MSN, NP-C 12/01/2020, 11:19 AM [facility] [address] [place], [place] [address] [phone]

## 2020-12-03 ENCOUNTER — Encounter: Payer: Self-pay | Admitting: Podiatry

## 2020-12-08 ENCOUNTER — Ambulatory Visit (INDEPENDENT_AMBULATORY_CARE_PROVIDER_SITE_OTHER): Payer: Managed Care, Other (non HMO) | Admitting: Podiatry

## 2020-12-08 ENCOUNTER — Other Ambulatory Visit: Payer: Self-pay

## 2020-12-08 DIAGNOSIS — M79676 Pain in unspecified toe(s): Secondary | ICD-10-CM

## 2020-12-08 DIAGNOSIS — Z9889 Other specified postprocedural states: Secondary | ICD-10-CM

## 2020-12-08 MED ORDER — HYDROCODONE-ACETAMINOPHEN 5-325 MG PO TABS: 1.0000 | ORAL_TABLET | Freq: Four times a day (QID) | ORAL | 0 refills | Status: DC | PRN

## 2020-12-08 NOTE — Progress Notes (Signed)
   Subjective:  Patient presents today status post Morton's neurectomy right foot. DOS: 11/04/2020.  Patient states that she is getting recurrence of the same pain that she had prior to surgery.  She has been walking in regular shoes.  She presents for further treatment and evaluation  Past Medical History:  Diagnosis Date   Allergy    Asthma    GERD (gastroesophageal reflux disease)    Hyperlipidemia    Morton's neuroma 10/2020   followed by podiatry   Smoking       Objective/Physical Exam Neurovascular status intact.  Skin incisions appear to be well coapted and healed. No sign of infectious process noted. No dehiscence. No active bleeding noted.  Negative for any significant edema noted to the surgical extremity.  There is some inflamed soft tissue to the plantar distal aspect of the second and third MTP joints.  Just distal to the ball of the foot.  Associated tenderness to palpation as well.  No palpable mass noted however the soft tissue clinically feels 'spongy' for a lack of better terminology   Assessment: 1. s/p Morton's neurectomy right foot. DOS: 11/04/2020   Plan of Care:  1. Patient was evaluated.  2.  Unfortunately the patient is having some swelling with recurrent tenderness of the same intensity prior to surgery to the same area. 3.  Continue ibuprofen 800 mg 3 times daily as needed 4.  Continue Vicodin 5/325 mg every 6 hours as needed 5.  Offloading felt metatarsal pads were applied to the insoles of the shoes to offload pressure from the forefoot 6.  Return to clinic in 4 weeks after the holidays to reevaluate.  He did discuss possible repeat MRI if there is no improvement   Edrick Kins, DPM [facility]  Dr. Edrick Kins, DPM    [address], [place] [address]                Office [phone]  Fax [phone]

## 2020-12-09 ENCOUNTER — Encounter: Payer: Self-pay | Admitting: Family Medicine

## 2020-12-15 ENCOUNTER — Other Ambulatory Visit: Payer: Self-pay | Admitting: Family Medicine

## 2020-12-15 MED ORDER — ONDANSETRON HCL 4 MG PO TABS: 4.0000 mg | ORAL_TABLET | Freq: Three times a day (TID) | ORAL | 0 refills | Status: DC | PRN

## 2020-12-15 NOTE — Telephone Encounter (Signed)
Last refilled on 11/04/20 #20 with 0 refill LOV 09/24/20 several issues addressed.  Next appointment on 01/31/21 for CPE

## 2020-12-19 ENCOUNTER — Other Ambulatory Visit: Payer: Self-pay | Admitting: Family Medicine

## 2020-12-20 NOTE — Telephone Encounter (Signed)
Refill request Chantix Last refill 11/15/20 #60 Last office visit 07/12/20

## 2020-12-21 NOTE — Telephone Encounter (Signed)
Erx

## 2020-12-22 ENCOUNTER — Encounter: Payer: Managed Care, Other (non HMO) | Admitting: Podiatry

## 2020-12-23 ENCOUNTER — Other Ambulatory Visit: Payer: Self-pay | Admitting: Family Medicine

## 2020-12-23 NOTE — Telephone Encounter (Signed)
Name of Medication: Fioricet Name of Pharmacy: [facility] Last Fill or Written Date and Quantity: 11/24/20, #30 Last Office Visit and Type: 09/24/20, back pain; HA Next Office Visit and Type: 01/31/21, CPE Last Controlled Substance Agreement Date: none Last UDS: none

## 2020-12-25 MED ORDER — BUTALBITAL-APAP-CAFFEINE 50-325-40 MG PO TABS: 1.0000 | ORAL_TABLET | Freq: Three times a day (TID) | ORAL | 0 refills | Status: DC | PRN

## 2020-12-25 NOTE — Telephone Encounter (Signed)
ERx 

## 2021-01-03 ENCOUNTER — Other Ambulatory Visit: Payer: Self-pay | Admitting: Podiatry

## 2021-01-03 ENCOUNTER — Other Ambulatory Visit: Payer: Self-pay | Admitting: Family Medicine

## 2021-01-10 ENCOUNTER — Other Ambulatory Visit: Payer: Self-pay | Admitting: Podiatry

## 2021-01-10 ENCOUNTER — Other Ambulatory Visit: Payer: Self-pay

## 2021-01-10 ENCOUNTER — Ambulatory Visit (INDEPENDENT_AMBULATORY_CARE_PROVIDER_SITE_OTHER): Payer: Managed Care, Other (non HMO) | Admitting: Podiatry

## 2021-01-10 DIAGNOSIS — R2241 Localized swelling, mass and lump, right lower limb: Secondary | ICD-10-CM

## 2021-01-10 DIAGNOSIS — Z9889 Other specified postprocedural states: Secondary | ICD-10-CM

## 2021-01-10 NOTE — Telephone Encounter (Signed)
Please advise 

## 2021-01-11 ENCOUNTER — Encounter: Payer: Self-pay | Admitting: Podiatry

## 2021-01-11 MED ORDER — HYDROCODONE-ACETAMINOPHEN 5-325 MG PO TABS: 1.0000 | ORAL_TABLET | Freq: Four times a day (QID) | ORAL | 0 refills | Status: DC | PRN

## 2021-01-11 NOTE — Telephone Encounter (Signed)
Rx sent 

## 2021-01-16 NOTE — Progress Notes (Signed)
Subjective:  Patient presents today status post Morton's neurectomy right foot. DOS: 11/04/2020.  Patient states that she has had increased pain and tenderness especially to the plantar aspect of the second and third MTP joints.  She has tried wearing the arch supports with offloading felt metatarsal pads with no improvement.  Patient is concerned due to the increased pain and tenderness to the plantar aspect of the foot.  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  Skin incisions appear to be well coapted and healed. No sign of infectious process noted. No dehiscence. No active bleeding noted.  Negative for any significant edema noted to the surgical extremity.  No significant change since last visit there is some inflamed soft tissue to the plantar distal aspect of the second and third MTP joints.  Just distal to the ball of the foot.  Associated tenderness to palpation as well.  No palpable mass noted however the soft tissue clinically feels 'spongy' for a lack of better terminology   Assessment: 1. s/p Morton's neurectomy right foot. DOS: 11/04/2020   Plan of Care:  1. Patient was evaluated.  2.  Unfortunately the patient continues have pain and tenderness associated to the right forefoot especially plantar Lee.  There is enlargement of the plantar aspect of the ball of the foot at the second and third MTP joints.  I do believe repeat MRI would be beneficial to ensure there is nothing that was missed and be able to compare it to last MRI that was performed 08/31/2020. 3.  Also unfortunately in the offloading felt metatarsal pads have not provided any relief for the patient.  She says that she continues to have swelling and inflammation with  the "knot" to the plantar aspect of the second and third MTP joint.  Prior to surgery there was significant enlargement of this area which almost demonstrated a palpable nodule however MRI only diagnosed as Morton's neuroma.  We did discuss revisional surgery today which would include a plantar incision along the sulcus of the forefoot to explore this area and remove the soft tissue to send to pathology as well as exploration of this area.  Patient agrees would like to have this performed since there has been no improvement despite conservative care postoperatively and preoperatively.  All possible complications and details the procedure were explained.  No guarantees were expressed or implied.  The patient consented for surgical correction 4.  Authorization for surgery was initiated again today.  Surgery will consist of excision of soft tissue mass right plantar forefoot 5.  Order was also placed for MRI.  This would be beneficial for surgical planning prior to surgery 6.  Refill prescription for Vicodin 5/3 2 5  mg and ibuprofen 800 mg 7.  Return to clinic 1 week postop   Edrick Kins, DPM [facility]  Dr. Edrick Kins, DPM    [address], [place] [address]  Office [phone]  Fax [phone]

## 2021-01-17 ENCOUNTER — Other Ambulatory Visit: Payer: Self-pay | Admitting: Family Medicine

## 2021-01-17 ENCOUNTER — Telehealth: Payer: Self-pay | Admitting: Urology

## 2021-01-17 NOTE — Telephone Encounter (Signed)
DOS - 02/17/21  EXC SOFT TISSUE MASS RIGHT --- 17921  [employer] EFFECTIVE DATE - 11/03/15  PER [employer]'S AUTOMATIVE SYSTEM FOR CPT CODE 78375 NO PRIOR AUTH IS REQUIRED.   REF # [id number]

## 2021-01-20 ENCOUNTER — Other Ambulatory Visit: Payer: Self-pay | Admitting: Family Medicine

## 2021-01-20 NOTE — Telephone Encounter (Signed)
Chantix Last filled:  12/21/20, #60 Last OV:  09/24/20, pain f/u Next OV: 01/31/21, CPE

## 2021-01-24 ENCOUNTER — Other Ambulatory Visit: Payer: Self-pay | Admitting: Podiatry

## 2021-01-24 NOTE — Telephone Encounter (Signed)
Please advise 

## 2021-01-25 MED ORDER — HYDROCODONE-ACETAMINOPHEN 5-325 MG PO TABS: 1.0000 | ORAL_TABLET | Freq: Four times a day (QID) | ORAL | 0 refills | Status: DC | PRN

## 2021-01-27 ENCOUNTER — Encounter: Payer: Self-pay | Admitting: Podiatry

## 2021-01-28 ENCOUNTER — Other Ambulatory Visit: Payer: Self-pay | Admitting: Family Medicine

## 2021-01-31 ENCOUNTER — Other Ambulatory Visit: Payer: Self-pay | Admitting: Podiatry

## 2021-01-31 ENCOUNTER — Telehealth: Payer: Self-pay | Admitting: Podiatry

## 2021-01-31 ENCOUNTER — Ambulatory Visit: Payer: Managed Care, Other (non HMO) | Admitting: Family Medicine

## 2021-01-31 MED ORDER — HYDROCODONE-ACETAMINOPHEN 5-325 MG PO TABS: 1.0000 | ORAL_TABLET | Freq: Four times a day (QID) | ORAL | 0 refills | Status: DC | PRN

## 2021-01-31 NOTE — Telephone Encounter (Signed)
Please advise 

## 2021-01-31 NOTE — Telephone Encounter (Signed)
Patient called [facility] and [facility] are out of the medication you sent in. Patient wants to know if you could send to [facility] in [place] [phone].

## 2021-02-06 ENCOUNTER — Other Ambulatory Visit: Payer: Self-pay | Admitting: Family Medicine

## 2021-02-07 NOTE — Telephone Encounter (Signed)
Name of Medication: [place] Name of Pharmacy: [facility] Last Fill or Written Date and Quantity: 12/25/20, #30 Last Office Visit and Type: 09/24/20, HA; back pain Next Office Visit and Type: 03/08/21, CPE Last Controlled Substance Agreement Date: none Last UDS: none

## 2021-02-08 MED ORDER — BUTALBITAL-APAP-CAFFEINE 50-325-40 MG PO TABS: 1.0000 | ORAL_TABLET | Freq: Three times a day (TID) | ORAL | 0 refills | Status: DC | PRN

## 2021-02-08 NOTE — Telephone Encounter (Signed)
ERx 

## 2021-02-09 ENCOUNTER — Ambulatory Visit
Admission: RE | Admit: 2021-02-09 | Discharge: 2021-02-09 | Disposition: A | Discharge status: Home / Self Care | Payer: Managed Care, Other (non HMO) | Source: Ambulatory Visit | Attending: Podiatry | Admitting: Podiatry

## 2021-02-09 DIAGNOSIS — R2241 Localized swelling, mass and lump, right lower limb: Secondary | ICD-10-CM

## 2021-02-09 IMAGING — MR MR FOOT*R* WO/W CM
7 of 10 series · 29 of 40 positions shown · IV contrast (multihance)
Comparison: Prior MRI examination dated [DATE]

CLINICAL DATA: Soft tissue mass between the second and third
metatarsals. Patient has surgery for neuroma on [DATE].

EXAM:
MRI OF THE RIGHT FOREFOOT WITHOUT AND WITH CONTRAST
TECHNIQUE: Multiplanar, multisequence MR imaging of the right forefoot was
performed before and after the administration of intravenous
contrast.
CONTRAST:  13mL MULTIHANCE GADOBENATE DIMEGLUMINE 529 MG/ML IV SOLN

[Series 4: T1 · coronal · 3.0mm · 0.19mm/px · 5 of 45 slices shown (1 of 2)]
[im 1/45]
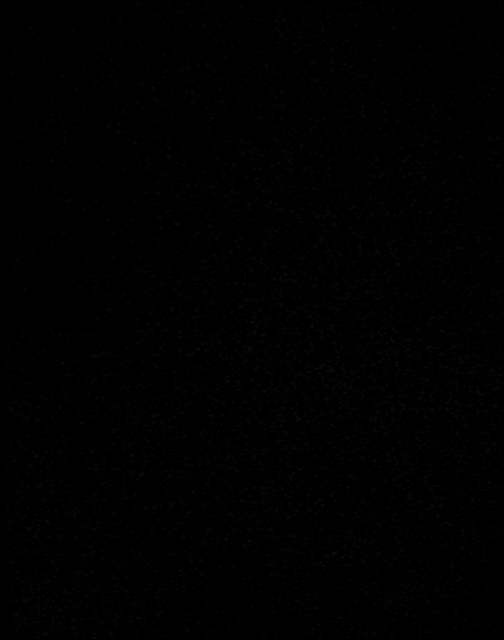
[im 12/45]
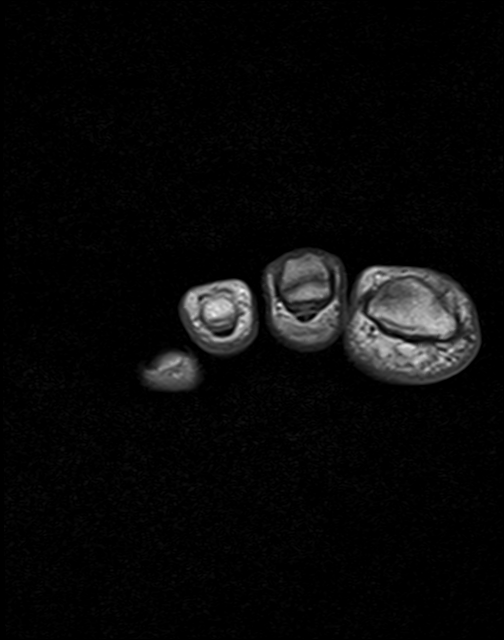
[im 23/45]
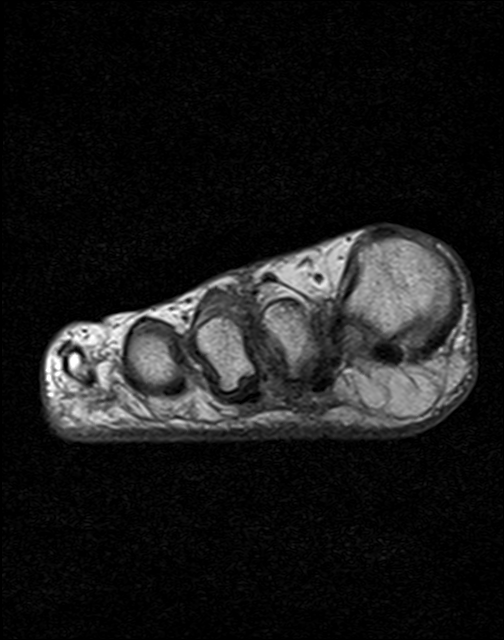
[im 34/45]
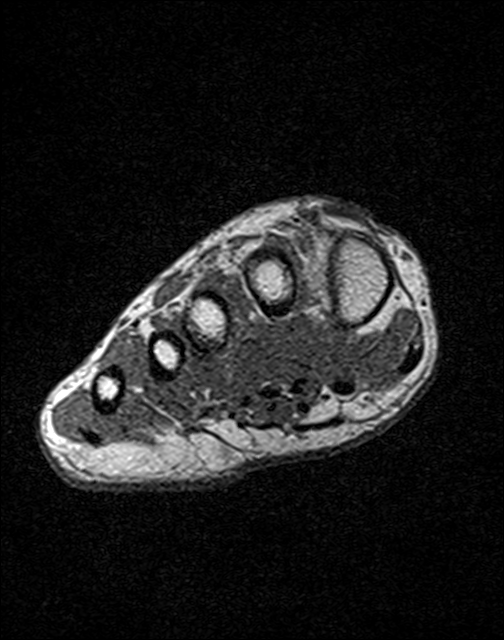
[im 45/45]
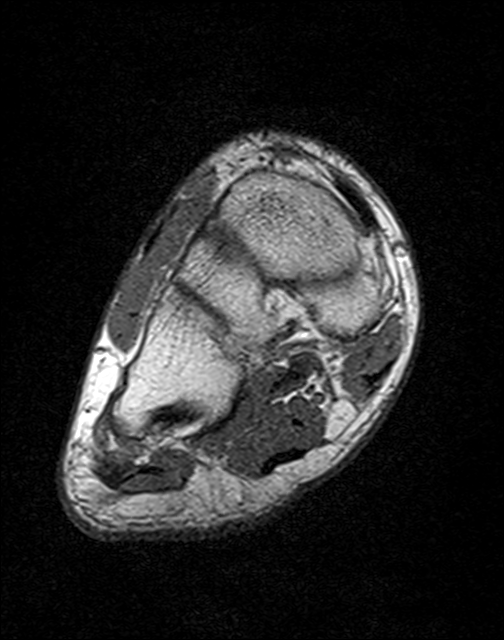

[Series 5: T2 fat-sat · coronal · 3.0mm · 0.19mm/px · 5 of 45 slices shown (1 of 2)]
[im 1/45]
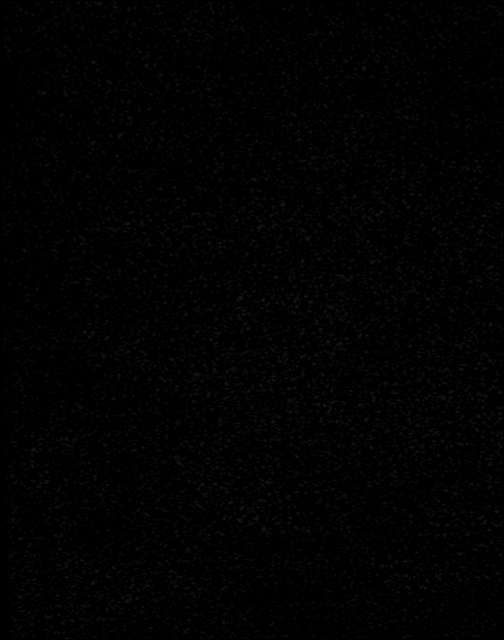
[im 12/45]
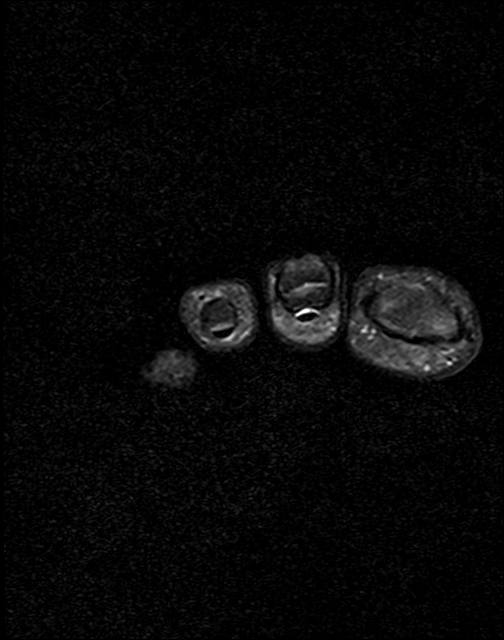
[im 23/45]
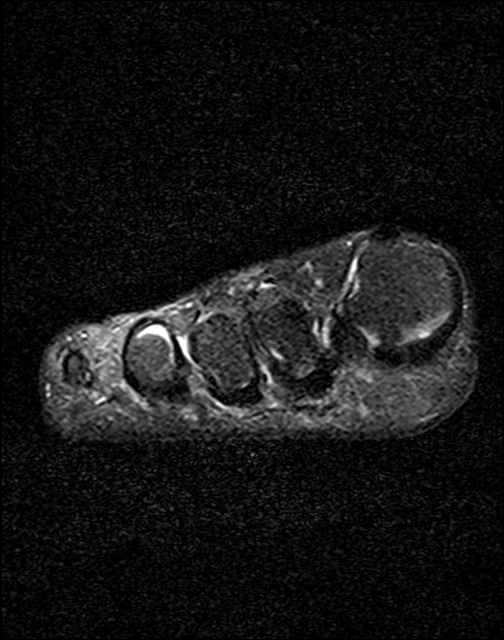
[im 34/45]
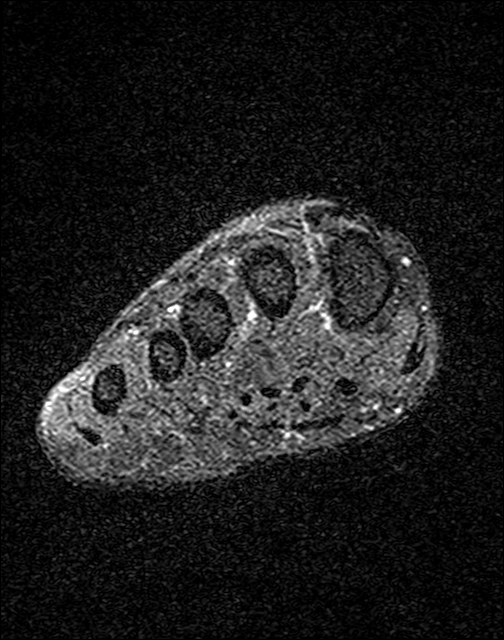
[im 45/45]
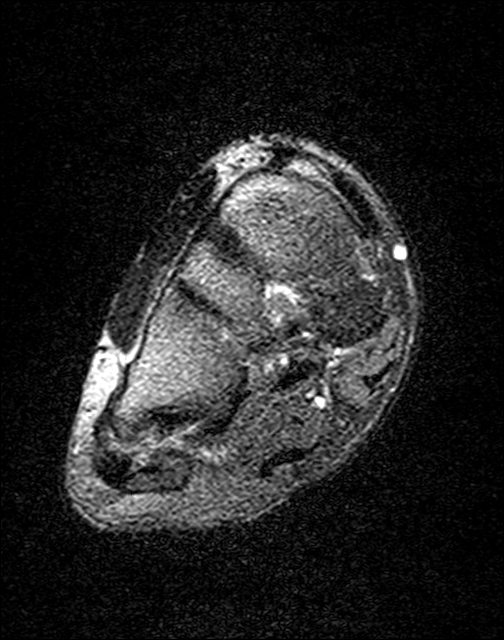

[Series 6: T2 fat-sat · axial · 3.0mm · 0.35mm/px · z∈[-120,-50]mm · 3 of 20 slices shown (2 of 2)]
[im 1/20]
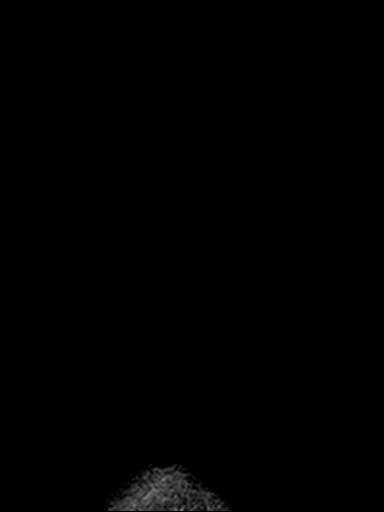
[im 10/20]
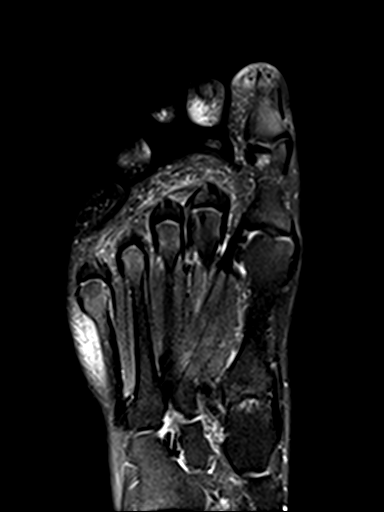
[im 20/20]
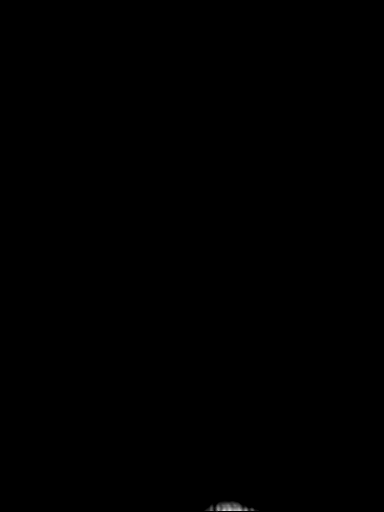

[Series 7: T1 · axial · 3.0mm · 0.35mm/px · z∈[-120,-50]mm · 3 of 20 slices shown (2 of 2)]
[im 1/20]
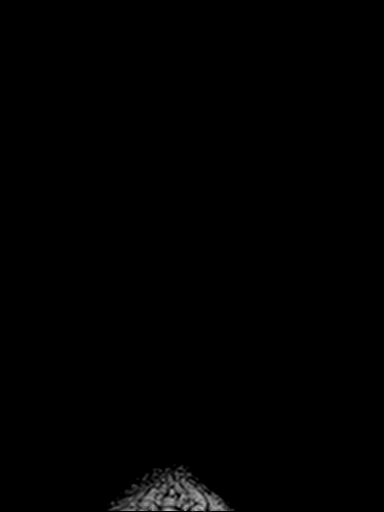
[im 10/20]
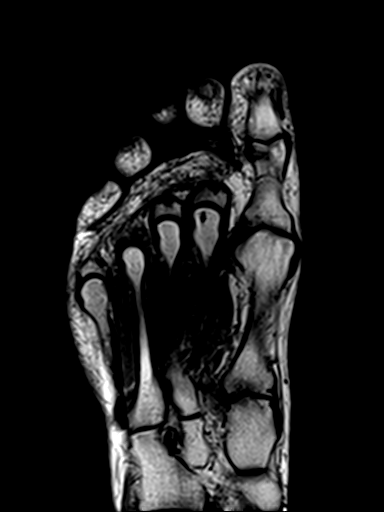
[im 20/20]
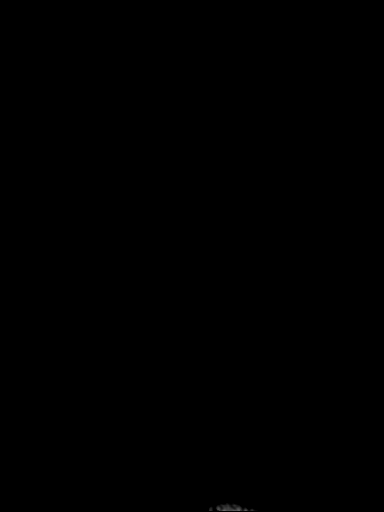

[Series 9: T1 fat-sat · coronal · non-contrast · 3.0mm · 0.47mm/px · 6 of 41 slices shown]
[im 1/41]
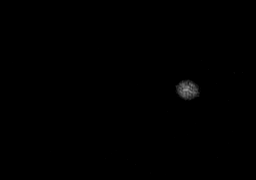
[im 9/41]
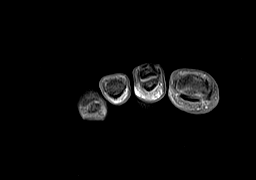
[im 17/41]
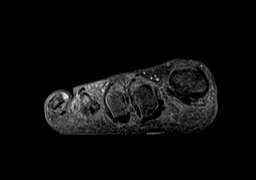
[im 25/41]
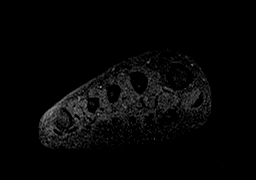
[im 33/41]
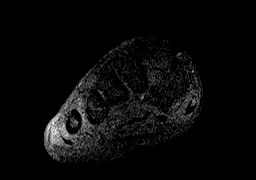
[im 41/41]
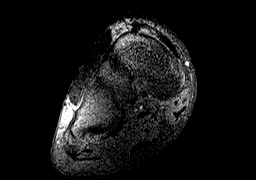

[Series 10: T1 fat-sat post-contrast · coronal · 3.0mm · 0.47mm/px · 6 of 41 slices shown (1 of 2)]
[im 1/41]
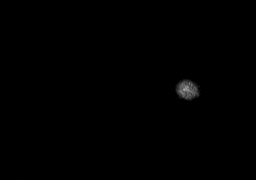
[im 9/41]
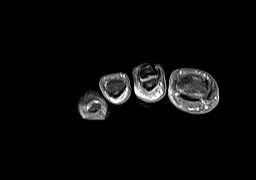
[im 17/41]
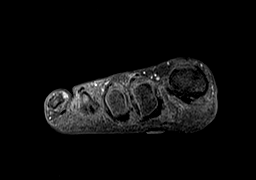
[im 25/41]
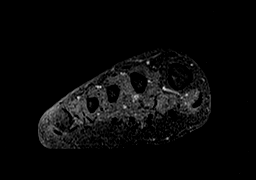
[im 33/41]
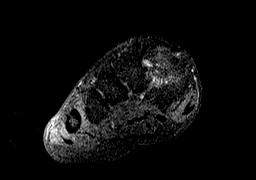
[im 41/41]
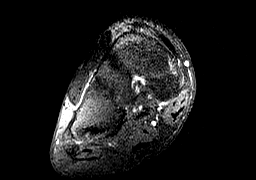

[Series 11: T1 fat-sat post-contrast · axial · 3.0mm · 0.35mm/px · 1 of 20 slices shown (2 of 2)]
[im 1/20]
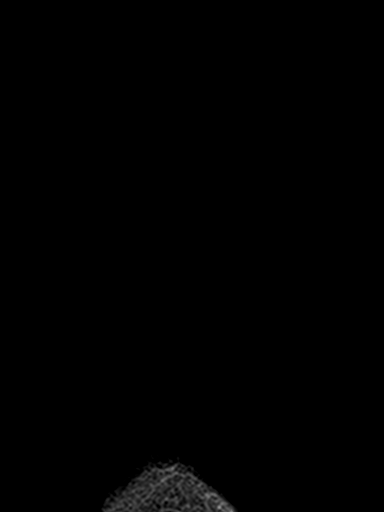

[29 of 40 positions shown; findings below may reference images not displayed]

FINDINGS: Bones/Joint/Cartilage

No evidence of fracture or osteonecrosis. Degenerative changes
prominent at the first metatarsophalangeal joint. There also
degenerative changes with subchondral edema at the first
tarsometatarsal joint.

Ligaments

Collateral and Lisfranc ligaments are intact.

Muscles and Tendons

Muscles and tendons are intact. No evidence of tenosynovitis. No
evidence of muscle atrophy or edema.

Soft tissues

There is T1 and T2 hypointense structure between the second and
third metatarsals (series [DATE]-image 27) with homogeneous
enhancement on post-contrast sequences. This structure measures
approximately 0.7 x 1.1 cm.
IMPRESSION: 1. Soft tissue mass within the second intermetatarsal space
measuring approximately 0.7 x 1.1 cm, most consistent with
recurrence of Morton's neuroma. Differential includes fibroma.

2. Moderate osteoarthritis of the first tarsometatarsal and
metatarsophalangeal joints.

3.  No evidence of acute fracture or dislocation.

## 2021-02-09 MED ORDER — GADOBENATE DIMEGLUMINE 529 MG/ML IV SOLN
13.0000 mL | Freq: Once | INTRAVENOUS | Status: AC | PRN
  Administered 2021-02-09: 13 mL via INTRAVENOUS

## 2021-02-17 ENCOUNTER — Encounter: Payer: Self-pay | Admitting: Podiatry

## 2021-02-17 ENCOUNTER — Telehealth: Payer: Self-pay | Admitting: Podiatry

## 2021-02-17 ENCOUNTER — Other Ambulatory Visit: Payer: Self-pay | Admitting: Podiatry

## 2021-02-17 DIAGNOSIS — D492 Neoplasm of unspecified behavior of bone, soft tissue, and skin: Secondary | ICD-10-CM | POA: Diagnosis not present

## 2021-02-17 MED ORDER — HYDROCODONE-ACETAMINOPHEN 10-325 MG PO TABS: 1.0000 | ORAL_TABLET | ORAL | 0 refills | Status: AC | PRN

## 2021-02-17 MED ORDER — HYDROCODONE-ACETAMINOPHEN 10-325 MG PO TABS: 1.0000 | ORAL_TABLET | Freq: Four times a day (QID) | ORAL | 0 refills | Status: DC | PRN

## 2021-02-17 NOTE — Telephone Encounter (Signed)
Pt needs pain needs pain medications sent to different pharmacy.  [facility] in [place] [address]

## 2021-02-17 NOTE — Progress Notes (Signed)
PRN postop 

## 2021-02-17 NOTE — Telephone Encounter (Signed)
Called and pt is laying down but told pts husband the medication has been called in.He said thank you.

## 2021-02-22 ENCOUNTER — Encounter: Payer: Self-pay | Admitting: Podiatry

## 2021-02-22 ENCOUNTER — Other Ambulatory Visit: Payer: Self-pay | Admitting: Podiatry

## 2021-02-23 ENCOUNTER — Ambulatory Visit (INDEPENDENT_AMBULATORY_CARE_PROVIDER_SITE_OTHER): Payer: Managed Care, Other (non HMO) | Admitting: Podiatry

## 2021-02-23 ENCOUNTER — Encounter: Payer: Managed Care, Other (non HMO) | Admitting: Podiatry

## 2021-02-23 ENCOUNTER — Other Ambulatory Visit: Payer: Self-pay

## 2021-02-23 DIAGNOSIS — Z9889 Other specified postprocedural states: Secondary | ICD-10-CM

## 2021-02-23 MED ORDER — HYDROCODONE-ACETAMINOPHEN 10-325 MG PO TABS: 1.0000 | ORAL_TABLET | Freq: Four times a day (QID) | ORAL | 0 refills | Status: DC | PRN

## 2021-02-23 MED ORDER — HYDROCODONE-ACETAMINOPHEN 10-325 MG PO TABS: 1.0000 | ORAL_TABLET | ORAL | 0 refills | Status: AC | PRN

## 2021-02-23 NOTE — Progress Notes (Signed)
° °  Subjective:  Patient presents today status post excision of soft tissue mass right foot. DOS: 02/17/2021.  Patient states that she is doing well.  She does have a significant pain and tenderness associated to the forefoot.  She is kept the dressings clean dry and intact and weightbearing minimally in the cam boot.  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  Skin incisions appear to be well coapted with sutures intact. No sign of infectious process noted. No dehiscence. No active bleeding noted. Moderate edema noted to the surgical extremity.  Assessment: 1. s/p excision of soft tissue mass right plantar forefoot. DOS: 02/17/2021   Plan of Care:  1. Patient was evaluated.  2.  Dressings changed.  Recommend Ace wrap daily and antibiotic cream over the incision site 3.  Continue weightbearing in the cam boot.  Postsurgical shoe was also dispensed 4.  Return to clinic in 2 weeks for suture removal   Edrick Kins, DPM [facility]  Dr. Edrick Kins, DPM    [address] [place], [place] [address]                Office [phone]  Fax [phone]

## 2021-02-28 ENCOUNTER — Other Ambulatory Visit: Payer: Self-pay | Admitting: Family Medicine

## 2021-02-28 NOTE — Telephone Encounter (Signed)
Refill request for butalbital-acetaminophen-caffeine (FIORICET) 50-325-40 MG tablet  LOV - 09/24/20 Next OV - 03/08/21 Last refill - 02/08/21 #30/0  Patient also needs zofran refilled

## 2021-03-01 ENCOUNTER — Encounter: Payer: Self-pay | Admitting: Family Medicine

## 2021-03-01 ENCOUNTER — Other Ambulatory Visit: Payer: Self-pay | Admitting: Family Medicine

## 2021-03-01 MED ORDER — BUTALBITAL-APAP-CAFFEINE 50-325-40 MG PO TABS: 1.0000 | ORAL_TABLET | Freq: Three times a day (TID) | ORAL | 0 refills | Status: DC | PRN

## 2021-03-01 MED ORDER — ONDANSETRON HCL 4 MG PO TABS: 4.0000 mg | ORAL_TABLET | Freq: Three times a day (TID) | ORAL | 0 refills | Status: DC | PRN

## 2021-03-01 NOTE — Telephone Encounter (Signed)
Refill request Fioricet Duplicate request Last office visit 09/24/20 Last refill today 03/01/21

## 2021-03-01 NOTE — Telephone Encounter (Signed)
ERx 

## 2021-03-02 ENCOUNTER — Encounter: Payer: Managed Care, Other (non HMO) | Admitting: Podiatry

## 2021-03-03 ENCOUNTER — Other Ambulatory Visit: Payer: Self-pay | Admitting: Podiatry

## 2021-03-03 MED ORDER — ATORVASTATIN CALCIUM 40 MG PO TABS: 40.0000 mg | ORAL_TABLET | Freq: Every day | ORAL | 0 refills | Status: DC

## 2021-03-03 NOTE — Telephone Encounter (Signed)
E-scribed refill to [facility]. 

## 2021-03-04 MED ORDER — HYDROCODONE-ACETAMINOPHEN 10-325 MG PO TABS: 1.0000 | ORAL_TABLET | Freq: Four times a day (QID) | ORAL | 0 refills | Status: DC | PRN

## 2021-03-04 NOTE — Telephone Encounter (Signed)
Please advise 

## 2021-03-07 ENCOUNTER — Telehealth: Payer: Self-pay

## 2021-03-07 ENCOUNTER — Ambulatory Visit (INDEPENDENT_AMBULATORY_CARE_PROVIDER_SITE_OTHER): Payer: Managed Care, Other (non HMO) | Admitting: Podiatry

## 2021-03-07 ENCOUNTER — Other Ambulatory Visit: Payer: Self-pay

## 2021-03-07 ENCOUNTER — Other Ambulatory Visit: Payer: Self-pay | Admitting: Podiatry

## 2021-03-07 DIAGNOSIS — Z9889 Other specified postprocedural states: Secondary | ICD-10-CM

## 2021-03-07 NOTE — Progress Notes (Signed)
? ?  Subjective:  ?Patient presents today status post excision of soft tissue mass right foot. DOS: 02/17/2021.  Patient says that today she is doing very well.  She has minimal pain associated to the surgical forefoot.  She has been mostly weightbearing in the cam boot.  When she goes driving she wears a postsurgical shoe.  She presents for further treatment and evaluation ? ?Past Medical History:  ?Diagnosis Date  ? Allergy   ? Asthma   ? GERD (gastroesophageal reflux disease)   ? Hyperlipidemia   ? 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 followed by podiatry  ? Smoking   ? ?Past Surgical History:  ?Procedure Laterality Date  ? NEURECTOMY FOOT Right 11/04/2020  ? Morton's neurectomy Amalia Hailey)  ? ?Allergies  ?Allergen Reactions  ? Other   ?  Other reaction(s): rash/itching  ? Latex Itching  ? Sulfa Antibiotics Rash  ? ? ? ?Objective/Physical Exam ?Neurovascular status intact.  Skin incisions appear to be well coapted with sutures intact. No sign of infectious process noted. No dehiscence. No active bleeding noted.  Mild edema noted to the forefoot ? ?Assessment: ?1. s/p excision of soft tissue mass right plantar forefoot. DOS: 02/17/2021 ? ? ?Plan of Care:  ?1. Patient was evaluated.  ?2.  Sutures removed ?3.  Patient may continue weightbearing in the cam boot.  When the patient is very sedentary she may transition out of the cam boot into good supportive tennis shoes or sneakers ?4.  Return to clinic in 4 weeks ? ?Edrick Kins, DPM ?[place] ? ?Dr. Edrick Kins, DPM  ?  ?[address]                                    ?[place], [place] [address]                ?Office [phone]  ?Fax [phone] ? ? ? ? ? ?

## 2021-03-07 NOTE — Telephone Encounter (Signed)
ERROR

## 2021-03-08 ENCOUNTER — Ambulatory Visit: Payer: Managed Care, Other (non HMO) | Admitting: Family Medicine

## 2021-03-14 ENCOUNTER — Encounter: Payer: Managed Care, Other (non HMO) | Admitting: Podiatry

## 2021-03-21 ENCOUNTER — Encounter: Payer: Managed Care, Other (non HMO) | Admitting: Podiatry

## 2021-03-23 ENCOUNTER — Other Ambulatory Visit: Payer: Self-pay | Admitting: Podiatry

## 2021-03-24 ENCOUNTER — Other Ambulatory Visit: Payer: Self-pay | Admitting: Family Medicine

## 2021-03-24 NOTE — Telephone Encounter (Signed)
Please advise 

## 2021-03-25 MED ORDER — HYDROCODONE-ACETAMINOPHEN 10-325 MG PO TABS: 1.0000 | ORAL_TABLET | Freq: Four times a day (QID) | ORAL | 0 refills | Status: DC | PRN

## 2021-03-25 NOTE — Telephone Encounter (Signed)
Name of Medication: Fioricet ?Name of Pharmacy: [facility] ?Last Fill or Written Date and Quantity: 03/01/21, #30 ?Last Office Visit and Type: 09/24/20, HA; back pain ?Next Office Visit and Type: 04/29/21., CPE ?Last Controlled Substance Agreement Date: none ?Last UDS: none ? ? ?

## 2021-03-26 MED ORDER — BUTALBITAL-APAP-CAFFEINE 50-325-40 MG PO TABS: 1.0000 | ORAL_TABLET | Freq: Two times a day (BID) | ORAL | 0 refills | Status: DC | PRN

## 2021-03-26 NOTE — Telephone Encounter (Signed)
ERx 

## 2021-03-28 ENCOUNTER — Ambulatory Visit (INDEPENDENT_AMBULATORY_CARE_PROVIDER_SITE_OTHER): Payer: Managed Care, Other (non HMO) | Admitting: Podiatry

## 2021-03-28 ENCOUNTER — Other Ambulatory Visit: Payer: Self-pay

## 2021-03-28 DIAGNOSIS — Z9889 Other specified postprocedural states: Secondary | ICD-10-CM

## 2021-03-28 NOTE — Progress Notes (Signed)
? ?  Subjective:  ?Patient presents today status post excision of soft tissue mass right foot. DOS: 02/17/2021.  Overall the patient states that she is doing well.  She says the skin incisions are healed nicely.  No new complaints at this time. ? ?Past Medical History:  ?Diagnosis Date  ? Allergy   ? Asthma   ? GERD (gastroesophageal reflux disease)   ? Hyperlipidemia   ? 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 followed by podiatry  ? Smoking   ? ?Past Surgical History:  ?Procedure Laterality Date  ? NEURECTOMY FOOT Right 11/04/2020  ? Morton's neurectomy Amalia Hailey)  ? ?Allergies  ?Allergen Reactions  ? Other   ?  Other reaction(s): rash/itching  ? Latex Itching  ? Sulfa Antibiotics Rash  ? ? ? ?Objective/Physical Exam ?Neurovascular status intact.  Skin incisions healed.  Minimal edema noted.  There is some stiffness with range of motion to the second and third MTP joint secondary to scar tissue. ? ?Assessment: ?1. s/p excision of soft tissue mass right plantar forefoot. DOS: 02/17/2021 ? ? ?Plan of Care:  ?1. Patient was evaluated.  ?2.  Overall the patient is doing much better with the reduction of the swelling in the foot.  Incisions are healed nicely.  There is some stiffness secondary to scar tissue in the area. ?3.  Recommend OTC Voltaren gel 1% 2 times daily to massage into the surgical area ?4.  Recommend daily range of motion exercises to the second and third MTP ?5.  Offloading felt metatarsal pads were applied to the insoles in the shoes to offload pressure from the forefoot ?6.  Patient may slowly increase activity ?7.  Return to clinic in 6 weeks ? ?Edrick Kins, DPM ?[facility] ? ?Dr. Edrick Kins, DPM  ?  ?[address]                                    ?[place], [place] [address]                ?Office [phone]  ?Fax [phone] ? ? ? ? ? ?

## 2021-04-04 ENCOUNTER — Ambulatory Visit: Payer: Managed Care, Other (non HMO) | Admitting: Gastroenterology

## 2021-04-04 ENCOUNTER — Encounter: Payer: Self-pay | Admitting: Gastroenterology

## 2021-04-04 VITALS — BP 110/78 | HR 72 | Ht 63.78 in | Wt 143.5 lb

## 2021-04-04 DIAGNOSIS — K219 Gastro-esophageal reflux disease without esophagitis: Secondary | ICD-10-CM

## 2021-04-04 DIAGNOSIS — Z1211 Encounter for screening for malignant neoplasm of colon: Secondary | ICD-10-CM | POA: Diagnosis not present

## 2021-04-04 MED ORDER — ESOMEPRAZOLE MAGNESIUM 40 MG PO CPDR: 40.0000 mg | DELAYED_RELEASE_CAPSULE | Freq: Two times a day (BID) | ORAL | 1 refills | Status: DC

## 2021-04-04 MED ORDER — NA SULFATE-K SULFATE-MG SULF 17.5-3.13-1.6 GM/177ML PO SOLN: 1.0000 | Freq: Once | ORAL | 0 refills | Status: AC

## 2021-04-04 NOTE — Patient Instructions (Signed)
You have been scheduled for an endoscopy and colonoscopy. Please follow the written instructions given to you at your visit today. ?Please pick up your prep supplies at the pharmacy within the next 1-3 days. ?If you use inhalers (even only as needed), please bring them with you on the day of your procedure. ? ?If you are age 57 or older, your body mass index should be between 23-30. Your Body mass index is 24.8 kg/m?Marland Kitchen If this is out of the aforementioned range listed, please consider follow up with your Primary Care Provider. ? ?If you are age 60 or younger, your body mass index should be between 19-25. Your Body mass index is 24.8 kg/m?Marland Kitchen If this is out of the aformentioned range listed, please consider follow up with your Primary Care Provider.  ? ?________________________________________________________ ? ?The [place] providers would like to encourage you to use [facility] to communicate with providers for non-urgent requests or questions.  Due to long hold times on the telephone, sending your provider a message by [facility] may be a faster and more efficient way to get a response.  Please allow 48 business hours for a response.  Please remember that this is for non-urgent requests.  ?_______________________________________________________ ? ?

## 2021-04-04 NOTE — Progress Notes (Signed)
? ? ? ?04/04/2021 ?Kristie Miles ?[id number] ?[birth year] ? ? ?HISTORY OF PRESENT ILLNESS: This is a 56 year old female who is new to our office.  She is been referred here by her PCP, Dr. Danise Mina, for evaluation of longstanding issues with GERD.  She tells me that she has had reflux issues for a long time.  She has been on esomeprazole 40 mg daily for several years.  Has never had an EGD.  Says that recently she feels like she has been having terrible heartburn more so later in the day like the medication is wearing off.  She recently added Pepcid at bedtime, but is not sure that it is making a difference.  She denies dysphagia.  No abdominal pain.  She takes ibuprofen, but only as needed on occasion.  She never had a colonoscopy in the past.  She denies any rectal bleeding.  Says she moves her bowels regularly. ? ?Past Medical History:  ?Diagnosis Date  ? Allergy   ? Anxiety   ? Arthritis   ? Asthma   ? GERD (gastroesophageal reflux disease)   ? Hyperlipidemia   ? Mini stroke   ? 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 followed by podiatry  ? Smoking   ? ?Past Surgical History:  ?Procedure Laterality Date  ? BONE SPUR Bilateral   ? feet  ? NEURECTOMY FOOT Right 11/04/2020  ? Morton's neurectomy Amalia Hailey)  ? PLANTAR FASCIA SURGERY Bilateral   ? ? reports that she has been smoking cigarettes. She has been smoking an average of .25 packs per day. She has never used smokeless tobacco. She reports current alcohol use. She reports that she does not use drugs. ?family history includes Alcohol abuse in her maternal grandfather and maternal grandmother; Arthritis in her maternal grandmother, mother, and paternal grandmother; Asthma in her daughter, father, and paternal grandmother; Atrial fibrillation in her mother; Cancer in her mother; Crohn's disease in her sister; Heart disease in her maternal grandfather; Hyperlipidemia in her mother; Hypertension in her mother; Kidney cancer in her father; Kidney disease in her father; Tremor in her  daughter and mother. ?Allergies  ?Allergen Reactions  ? Latex Itching  ? Sulfa Antibiotics Rash  ? ? ?  ?Outpatient Encounter Medications as of 04/04/2021  ?Medication Sig  ? aspirin 81 MG EC tablet Take 1 tablet (81 mg total) by mouth daily. Swallow whole.  ? atorvastatin (LIPITOR) 40 MG tablet Take 1 tablet (40 mg total) by mouth daily.  ? butalbital-acetaminophen-caffeine (FIORICET) 50-325-40 MG tablet Take 1 tablet by mouth 2 (two) times daily as needed for migraine (sedation precautions).  ? Cholecalciferol (VITAMIN D3 PO) Take by mouth daily.  ? Cyanocobalamin (B-12 PO) Take by mouth daily.  ? esomeprazole (NEXIUM) 40 MG capsule Take 1 capsule (40 mg total) by mouth 2 (two) times daily before a meal.  ? famotidine (PEPCID) 40 MG tablet Take 1 tablet (40 mg total) by mouth at bedtime as needed for heartburn or indigestion.  ? Flaxseed, Linseed, (FLAX SEEDS PO) Take by mouth.  ? ibuprofen (ADVIL) 800 MG tablet TAKE 1 TABLET BY MOUTH THREE TIMES A DAY  ? lidocaine (XYLOCAINE) 5 % ointment Apply 1 application. topically as needed.  ? Multiple Vitamins-Minerals (CENTRUM PO) Take by mouth daily.  ? Na Sulfate-K Sulfate-Mg Sulf 17.5-3.13-1.6 GM/177ML SOLN Take 1 kit by mouth once for 1 dose.  ? Nutritional Supplements (ESTROVEN PO) Take by mouth.  ? ondansetron (ZOFRAN) 4 MG tablet Take 1 tablet (4 mg total) by mouth every 8 (  eight) hours as needed.  ? PARoxetine (PAXIL) 40 MG tablet TAKE 1 TABLET(40 MG) BY MOUTH EVERY MORNING  ? primidone (MYSOLINE) 50 MG tablet Take 2 tablets (100 mg total) by mouth at bedtime.  ? propranolol ER (INDERAL LA) 160 MG SR capsule Take 1 capsule (160 mg total) by mouth daily.  ? Rhubarb (ESTROVEN MENOPAUSE RELIEF) 4 MG TABS Take by mouth.  ? valACYclovir (VALTREX) 500 MG tablet TAKE 1 TABLET BY MOUTH TWICE A DAY AS NEEDED  ? varenicline (CHANTIX) 0.5 MG tablet TAKE 1 TABLET BY MOUTH TWICE A DAY  ? [DISCONTINUED] esomeprazole (NEXIUM) 40 MG capsule TAKE 1 CAPSULE (40 MG TOTAL) BY MOUTH  DAILY AT 12 NOON.  ? Rimegepant Sulfate (NURTEC) 75 MG TBDP Take 1 tablet by mouth daily as needed (migraine). (Patient not taking: Reported on 04/04/2021)  ? topiramate (TOPAMAX) 50 MG tablet Take 1 tablet (50 mg total) by mouth at bedtime. (Patient not taking: Reported on 04/04/2021)  ? [DISCONTINUED] conjugated estrogens (PREMARIN) vaginal cream Place 1 Applicatorful vaginally 3 (three) times a week.  ? [DISCONTINUED] gentamicin cream (GARAMYCIN) 0.1 % Apply 1 application topically 2 (two) times daily.  ? [DISCONTINUED] HYDROcodone-acetaminophen (NORCO) 10-325 MG tablet Take 1 tablet by mouth every 6 (six) hours as needed.  ? ?No facility-administered encounter medications on file as of 04/04/2021.  ? ? ?REVIEW OF SYSTEMS  : All other systems reviewed and negative except where noted in the History of Present Illness. ? ? ?PHYSICAL EXAM: ?BP 110/78 (BP Location: Left Arm, Patient Position: Sitting, Cuff Size: Normal)   Pulse 72   Ht 5' 3.78" (1.62 m)   Wt 143 lb 8 oz (65.1 kg)   BMI 24.80 kg/m?  ?General: Well developed white female in no acute distress ?Head: Normocephalic and atraumatic ?Eyes:  Sclerae anicteric, conjunctiva pink. ?Ears: Normal auditory acuity ?Lungs: Clear throughout to auscultation; no W/R/R. ?Heart: Regular rate and rhythm; no M/R/G. ?Abdomen: Soft, non-distended.  BS present.  Non-tender. ?Rectal:  Will be done at the time of colonoscopy. ?Musculoskeletal: Symmetrical with no gross deformities  ?Skin: No lesions on visible extremities ?Extremities: No edema  ?Neurological: Alert oriented x 4, grossly non-focal ?Psychological:  Alert and cooperative. Normal mood and affect ? ?ASSESSMENT AND PLAN: ?*GERD: Longstanding GERD.  On esomeprazole 40 mg daily for several years.  Now with reflux symptoms occurring later in the day.  We will try adding a dose in the evening as well.  Prescription sent to the pharmacy.  We will plan for EGD as she has never had one in the past. ?*CRC screening: Never had  a colonoscopy in the past.  We will schedule for colonoscopy with Dr. Nandigam as well. ? ?**The risks, benefits, and alternatives were discussed with the patient and she consents to proceed.  ? ?  ?CC:  Gutierrez, Javier, MD ? ?  ?

## 2021-04-05 NOTE — Progress Notes (Signed)
Reviewed and agree with documentation and assessment and plan. K. Veena Kadeisha Betsch , MD   

## 2021-04-06 ENCOUNTER — Encounter: Payer: Managed Care, Other (non HMO) | Admitting: Podiatry

## 2021-04-06 ENCOUNTER — Other Ambulatory Visit: Payer: Self-pay | Admitting: Family Medicine

## 2021-04-13 ENCOUNTER — Encounter: Payer: Managed Care, Other (non HMO) | Admitting: Podiatry

## 2021-04-19 ENCOUNTER — Telehealth: Payer: Self-pay

## 2021-04-19 NOTE — Telephone Encounter (Signed)
Submitted PA for Nurtec 75 mg TBDP; key:  [id number].  Decision pending.  ?

## 2021-04-21 NOTE — Telephone Encounter (Addendum)
Received faxed PA approval, valid through 04/20/2022.  Made pharmacy aware.  ?

## 2021-04-27 ENCOUNTER — Encounter: Payer: Managed Care, Other (non HMO) | Admitting: Podiatry

## 2021-04-28 ENCOUNTER — Other Ambulatory Visit: Payer: Self-pay | Admitting: Family Medicine

## 2021-04-28 NOTE — Telephone Encounter (Signed)
Name of Medication: Fioricet ?Name of Pharmacy: [facility] ?Last Fill or Written Date and Quantity: 03/26/21, #30 ?Last Office Visit and Type: 09/24/20, chronic back pain ?Next Office Visit and Type: 05/17/21, CPE ?Last Controlled Substance Agreement Date: none ?Last UDS: none ? ?Zofran last rx:  03/01/21, #20 ? ? ?

## 2021-04-29 ENCOUNTER — Ambulatory Visit: Payer: Managed Care, Other (non HMO) | Admitting: Family Medicine

## 2021-05-02 MED ORDER — BUTALBITAL-APAP-CAFFEINE 50-325-40 MG PO TABS: 1.0000 | ORAL_TABLET | Freq: Two times a day (BID) | ORAL | 0 refills | Status: DC | PRN

## 2021-05-02 MED ORDER — ONDANSETRON HCL 4 MG PO TABS: 4.0000 mg | ORAL_TABLET | Freq: Three times a day (TID) | ORAL | 0 refills | Status: DC | PRN

## 2021-05-02 NOTE — Telephone Encounter (Signed)
ERx 

## 2021-05-06 ENCOUNTER — Encounter: Payer: Self-pay | Admitting: Gastroenterology

## 2021-05-06 ENCOUNTER — Ambulatory Visit (AMBULATORY_SURGERY_CENTER): Payer: Managed Care, Other (non HMO) | Admitting: Gastroenterology

## 2021-05-06 VITALS — BP 114/67 | HR 57 | Temp 98.7°F | Resp 18 | Ht 63.0 in | Wt 143.0 lb

## 2021-05-06 DIAGNOSIS — K219 Gastro-esophageal reflux disease without esophagitis: Secondary | ICD-10-CM | POA: Diagnosis present

## 2021-05-06 DIAGNOSIS — K2970 Gastritis, unspecified, without bleeding: Secondary | ICD-10-CM

## 2021-05-06 DIAGNOSIS — Z1211 Encounter for screening for malignant neoplasm of colon: Secondary | ICD-10-CM | POA: Diagnosis not present

## 2021-05-06 DIAGNOSIS — K2950 Unspecified chronic gastritis without bleeding: Secondary | ICD-10-CM | POA: Diagnosis not present

## 2021-05-06 DIAGNOSIS — K31A0 Gastric intestinal metaplasia, unspecified: Secondary | ICD-10-CM

## 2021-05-06 DIAGNOSIS — D125 Benign neoplasm of sigmoid colon: Secondary | ICD-10-CM

## 2021-05-06 DIAGNOSIS — K2100 Gastro-esophageal reflux disease with esophagitis, without bleeding: Secondary | ICD-10-CM | POA: Diagnosis not present

## 2021-05-06 DIAGNOSIS — K449 Diaphragmatic hernia without obstruction or gangrene: Secondary | ICD-10-CM | POA: Diagnosis not present

## 2021-05-06 MED ORDER — SODIUM CHLORIDE 0.9 % IV SOLN: 500.0000 mL | Freq: Once | INTRAVENOUS | Status: DC

## 2021-05-06 MED ORDER — ESOMEPRAZOLE MAGNESIUM 40 MG PO CPDR: 40.0000 mg | DELAYED_RELEASE_CAPSULE | Freq: Every day | ORAL | 3 refills | Status: DC

## 2021-05-06 MED ORDER — FAMOTIDINE 10 MG PO TABS: 20.0000 mg | ORAL_TABLET | Freq: Every day | ORAL | Status: DC

## 2021-05-06 NOTE — Progress Notes (Signed)
To Pacu, VSS. Report to [email or web address] 

## 2021-05-06 NOTE — Op Note (Signed)
[place] ?Patient Name: Kristie Miles ?Procedure Date: 05/06/2021 3:05 PM ?MRN: [id number] ?Endoscopist: Mauri Pole , MD ?Age: 56 ?Referring MD:  ?Date of Birth: [birth year] ?Gender: Female ?Account #: [id number] ?Procedure:                Colonoscopy ?Indications:              Screening for colorectal malignant neoplasm ?Medicines:                Monitored Anesthesia Care ?Procedure:                Pre-Anesthesia Assessment: ?                          - Prior to the procedure, a History and Physical  ?                          was performed, and patient medications and  ?                          allergies were reviewed. The patient's tolerance of  ?                          previous anesthesia was also reviewed. The risks  ?                          and benefits of the procedure and the sedation  ?                          options and risks were discussed with the patient.  ?                          All questions were answered, and informed consent  ?                          was obtained. Prior Anticoagulants: The patient has  ?                          taken no previous anticoagulant or antiplatelet  ?                          agents. ASA Grade Assessment: II - A patient with  ?                          mild systemic disease. After reviewing the risks  ?                          and benefits, the patient was deemed in  ?                          satisfactory condition to undergo the procedure. ?                          After obtaining informed consent, the colonoscope  ?  was passed under direct vision. Throughout the  ?                          procedure, the patient's blood pressure, pulse, and  ?                          oxygen saturations were monitored continuously. The  ?                          PCF-HQ190L Colonoscope was introduced through the  ?                          anus and advanced to the the cecum, identified by  ?                          appendiceal  orifice and ileocecal valve. The  ?                          colonoscopy was performed without difficulty. The  ?                          patient tolerated the procedure well. The quality  ?                          of the bowel preparation was good. The ileocecal  ?                          valve, appendiceal orifice, and rectum were  ?                          photographed. ?Scope In: 3:27:50 PM ?Scope Out: 3:48:13 PM ?Scope Withdrawal Time: 0 hours 15 minutes 23 seconds  ?Total Procedure Duration: 0 hours 20 minutes 23 seconds  ?Findings:                 The perianal and digital rectal examinations were  ?                          normal. ?                          Two sessile polyps were found in the sigmoid colon.  ?                          The polyps were 3 to 4 mm in size. These polyps  ?                          were removed with a cold snare. Resection and  ?                          retrieval were complete. ?                          Non-bleeding external and internal hemorrhoids were  ?  found during retroflexion. The hemorrhoids were  ?                          small. ?Complications:            No immediate complications. ?Estimated Blood Loss:     Estimated blood loss was minimal. ?Impression:               - Two 3 to 4 mm polyps in the sigmoid colon,  ?                          removed with a cold snare. Resected and retrieved. ?                          - Non-bleeding external and internal hemorrhoids. ?Recommendation:           - Patient has a contact number available for  ?                          emergencies. The signs and symptoms of potential  ?                          delayed complications were discussed with the  ?                          patient. Return to normal activities tomorrow.  ?                          Written discharge instructions were provided to the  ?                          patient. ?                          - Resume previous diet. ?                           - Continue present medications. ?                          - Await pathology results. ?                          - Repeat colonoscopy in 5-10 years for surveillance  ?                          based on pathology results. ?Mauri Pole, MD ?05/06/2021 3:54:19 PM ?This report has been signed electronically. ?

## 2021-05-06 NOTE — Op Note (Signed)
[place] ?Patient Name: Kristie Miles ?Procedure Date: 05/06/2021 3:05 PM ?MRN: [id number] ?Endoscopist: Mauri Pole , MD ?Age: 56 ?Referring MD:  ?Date of Birth: 31-Oct-1965 ?Gender: Female ?Account #: [id number] ?Procedure:                Upper GI endoscopy ?Indications:              Esophageal reflux symptoms that persist despite  ?                          appropriate therapy ?Medicines:                Monitored Anesthesia Care ?Procedure:                Pre-Anesthesia Assessment: ?                          - Prior to the procedure, a History and Physical  ?                          was performed, and patient medications and  ?                          allergies were reviewed. The patient's tolerance of  ?                          previous anesthesia was also reviewed. The risks  ?                          and benefits of the procedure and the sedation  ?                          options and risks were discussed with the patient.  ?                          All questions were answered, and informed consent  ?                          was obtained. Prior Anticoagulants: The patient has  ?                          taken no previous anticoagulant or antiplatelet  ?                          agents. ASA Grade Assessment: II - A patient with  ?                          mild systemic disease. After reviewing the risks  ?                          and benefits, the patient was deemed in  ?                          satisfactory condition to undergo the procedure. ?  After obtaining informed consent, the endoscope was  ?                          passed under direct vision. Throughout the  ?                          procedure, the patient's blood pressure, pulse, and  ?                          oxygen saturations were monitored continuously. The  ?                          Endoscope was introduced through the mouth, and  ?                          advanced to the second part of  duodenum. The upper  ?                          GI endoscopy was accomplished without difficulty.  ?                          The patient tolerated the procedure well. ?Scope In: ?Scope Out: ?Findings:                 There were esophageal mucosal changes suggestive of  ?                          short-segment Barrett's esophagus present in the  ?                          lower third of the esophagus. The maximum  ?                          longitudinal extent of these mucosal changes was 3  ?                          cm in length. Mucosa was biopsied with a cold  ?                          forceps for histology in the lower third of the  ?                          esophagus and at the gastroesophageal junction. One  ?                          specimen bottle was sent to pathology. ?                          A 2 cm hiatal hernia was present. ?                          Patchy mild inflammation characterized by  ?  congestion (edema) and erythema was found in the  ?                          entire examined stomach. Biopsies were taken with a  ?                          cold forceps for Helicobacter pylori testing. ?                          The cardia and gastric fundus were normal on  ?                          retroflexion. ?                          The examined duodenum was normal. ?Complications:            No immediate complications. ?Estimated Blood Loss:     Estimated blood loss was minimal. ?Impression:               - Esophageal mucosal changes suggestive of  ?                          short-segment Barrett's esophagus. Biopsied. ?                          - 2 cm hiatal hernia. ?                          - Gastritis. Biopsied. ?                          - Normal examined duodenum. ?Recommendation:           - Patient has a contact number available for  ?                          emergencies. The signs and symptoms of potential  ?                          delayed complications were  discussed with the  ?                          patient. Return to normal activities tomorrow.  ?                          Written discharge instructions were provided to the  ?                          patient. ?                          - Resume previous diet. ?                          - Continue present medications. ?                          -  Await pathology results. ?                          - Follow an antireflux regimen. ?                          - Use Nexium (esomeprazole) 40 mg PO daily. ?                          - Use Pepcid (famotidine) 20 mg PO daily at bedtime  ?                          as needed. ?                          - Return to GI office in 3 months. ?Mauri Pole, MD ?05/06/2021 3:59:00 PM ?This report has been signed electronically. ?

## 2021-05-06 NOTE — Progress Notes (Signed)
[facility] History and Physical ? ? ?Primary Care Physician:  Ria Bush, MD ? ? ?Reason for Procedure:  GERD and colon cancer screening ? ?Plan:    EGD and colonoscopy with possible interventions as needed ? ? ? ? ?HPI: Kristie Miles is a very pleasant 56 y.o. female here for egd for evaluation of persistent GERD symptoms, colonoscopy for colorectal cancer screening. ? ?Denies any nausea, vomiting, abdominal pain, melena or bright red blood per rectum ? ?The risks and benefits as well as alternatives of endoscopic procedure(s) have been discussed and reviewed. All questions answered. The patient agrees to proceed. ? ? ? ?Past Medical History:  ?Diagnosis Date  ? Allergy   ? Anxiety   ? Arthritis   ? Asthma   ? GERD (gastroesophageal reflux disease)   ? Hyperlipidemia   ? Mini stroke   ? 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 followed by podiatry  ? Smoking   ? ? ?Past Surgical History:  ?Procedure Laterality Date  ? BONE SPUR Bilateral   ? feet  ? NEURECTOMY FOOT Right 11/04/2020  ? Morton's neurectomy Amalia Hailey)  ? PLANTAR FASCIA SURGERY Bilateral   ? ? ?Prior to Admission medications   ?Medication Sig Start Date End Date Taking? Authorizing Provider  ?aspirin 81 MG EC tablet Take 1 tablet (81 mg total) by mouth daily. Swallow whole. 07/12/20   Ria Bush, MD  ?atorvastatin (LIPITOR) 40 MG tablet TAKE 1 TABLET BY MOUTH DAILY 04/06/21   Ria Bush, MD  ?butalbital-acetaminophen-caffeine (FIORICET) [phone] MG tablet Take 1 tablet by mouth 2 (two) times daily as needed for migraine (sedation precautions). 05/02/21   Ria Bush, MD  ?Cholecalciferol (VITAMIN D3 PO) Take by mouth daily.    [provider]  ?Cyanocobalamin (B-12 PO) Take by mouth daily.    [provider]  ?esomeprazole (NEXIUM) 40 MG capsule Take 1 capsule (40 mg total) by mouth 2 (two) times daily before a meal. 04/04/21   Zehr, Laban Emperor, PA-C  ?famotidine (PEPCID) 40 MG tablet Take 1 tablet (40 mg total) by  mouth at bedtime as needed for heartburn or indigestion. 09/24/20   Ria Bush, MD  ?Flaxseed, Linseed, (FLAX SEEDS PO) Take by mouth.    [provider]  ?ibuprofen (ADVIL) 800 MG tablet TAKE 1 TABLET BY MOUTH THREE TIMES A DAY 03/07/21   Edrick Kins, DPM  ?lidocaine (XYLOCAINE) 5 % ointment Apply 1 application. topically as needed. 01/26/21   [provider]  ?Multiple Vitamins-Minerals (CENTRUM PO) Take by mouth daily.    [provider]  ?Nutritional Supplements (ESTROVEN PO) Take by mouth.    [provider]  ?ondansetron (ZOFRAN) 4 MG tablet Take 1 tablet (4 mg total) by mouth every 8 (eight) hours as needed. 05/02/21   Ria Bush, MD  ?PARoxetine (PAXIL) 40 MG tablet TAKE 1 TABLET(40 MG) BY MOUTH EVERY MORNING 03/14/19   Jearld Fenton, NP  ?primidone (MYSOLINE) 50 MG tablet Take 2 tablets (100 mg total) by mouth at bedtime. 12/01/20   Ward Givens, NP  ?propranolol ER (INDERAL LA) 160 MG SR capsule Take 1 capsule (160 mg total) by mouth daily. 12/01/20   Ward Givens, NP  ?Rhubarb (ESTROVEN MENOPAUSE RELIEF) 4 MG TABS Take by mouth.    [provider]  ?Rimegepant Sulfate (NURTEC) 75 MG TBDP Take 1 tablet by mouth daily as needed (migraine). ?Patient not taking: Reported on 04/04/2021 09/07/20   Ria Bush, MD  ?topiramate (TOPAMAX) 50 MG tablet Take 1 tablet (  50 mg total) by mouth at bedtime. ?Patient not taking: Reported on 04/04/2021 07/12/20   Ria Bush, MD  ?valACYclovir (VALTREX) 500 MG tablet TAKE 1 TABLET BY MOUTH TWICE A DAY AS NEEDED 03/19/20   Jearld Fenton, NP  ?varenicline (CHANTIX) 0.5 MG tablet TAKE 1 TABLET BY MOUTH TWICE A DAY 01/21/21   Ria Bush, MD  ? ? ?Current Outpatient Medications  ?Medication Sig Dispense Refill  ? aspirin 81 MG EC tablet Take 1 tablet (81 mg total) by mouth daily. Swallow whole.    ? atorvastatin (LIPITOR) 40 MG tablet TAKE 1 TABLET BY MOUTH DAILY 90 tablet 0  ?  butalbital-acetaminophen-caffeine (FIORICET) 50-325-40 MG tablet Take 1 tablet by mouth 2 (two) times daily as needed for migraine (sedation precautions). 30 tablet 0  ? Cholecalciferol (VITAMIN D3 PO) Take by mouth daily.    ? Cyanocobalamin (B-12 PO) Take by mouth daily.    ? esomeprazole (NEXIUM) 40 MG capsule Take 1 capsule (40 mg total) by mouth 2 (two) times daily before a meal. 60 capsule 1  ? famotidine (PEPCID) 40 MG tablet Take 1 tablet (40 mg total) by mouth at bedtime as needed for heartburn or indigestion.    ? Flaxseed, Linseed, (FLAX SEEDS PO) Take by mouth.    ? ibuprofen (ADVIL) 800 MG tablet TAKE 1 TABLET BY MOUTH THREE TIMES A DAY 90 tablet 1  ? lidocaine (XYLOCAINE) 5 % ointment Apply 1 application. topically as needed.    ? Multiple Vitamins-Minerals (CENTRUM PO) Take by mouth daily.    ? Nutritional Supplements (ESTROVEN PO) Take by mouth.    ? ondansetron (ZOFRAN) 4 MG tablet Take 1 tablet (4 mg total) by mouth every 8 (eight) hours as needed. 20 tablet 0  ? PARoxetine (PAXIL) 40 MG tablet TAKE 1 TABLET(40 MG) BY MOUTH EVERY MORNING 90 tablet 2  ? primidone (MYSOLINE) 50 MG tablet Take 2 tablets (100 mg total) by mouth at bedtime. 180 tablet 3  ? propranolol ER (INDERAL LA) 160 MG SR capsule Take 1 capsule (160 mg total) by mouth daily. 90 capsule 3  ? Rhubarb (ESTROVEN MENOPAUSE RELIEF) 4 MG TABS Take by mouth.    ? Rimegepant Sulfate (NURTEC) 75 MG TBDP Take 1 tablet by mouth daily as needed (migraine). (Patient not taking: Reported on 04/04/2021) 15 tablet 1  ? topiramate (TOPAMAX) 50 MG tablet Take 1 tablet (50 mg total) by mouth at bedtime. (Patient not taking: Reported on 04/04/2021) 90 tablet 1  ? valACYclovir (VALTREX) 500 MG tablet TAKE 1 TABLET BY MOUTH TWICE A DAY AS NEEDED 180 tablet 1  ? varenicline (CHANTIX) 0.5 MG tablet TAKE 1 TABLET BY MOUTH TWICE A DAY 60 tablet 0  ? ?Current Facility-Administered Medications  ?Medication Dose Route Frequency Provider Last Rate Last Admin  ? 0.9  %  sodium chloride infusion  500 mL Intravenous Once Esli Jernigan, Venia Minks, MD      ? ? ?Allergies as of 05/06/2021 - Review Complete 05/06/2021  ?Allergen Reaction Noted  ? Latex Itching 12/10/2017  ? Sulfa antibiotics Rash 06/23/2016  ? ? ?Family History  ?Problem Relation Age of Onset  ? Arthritis Mother   ? Hyperlipidemia Mother   ? Hypertension Mother   ? Tremor Mother   ? Atrial fibrillation Mother   ? Cancer Mother   ?     blood cancer  ? Asthma Father   ? Kidney cancer Father   ? Kidney disease Father   ? Crohn's disease Sister   ?  Alcohol abuse Maternal Grandmother   ? Arthritis Maternal Grandmother   ? Heart disease Maternal Grandfather   ? Alcohol abuse Maternal Grandfather   ? Arthritis Paternal Grandmother   ? Asthma Paternal Grandmother   ? Asthma Daughter   ? Tremor Daughter   ? ? ?Social History  ? ?Socioeconomic History  ? Marital status: Married  ?  Spouse name: Not on file  ? Number of children: 1  ? Years of education: Not on file  ? Highest education level: Not on file  ?Occupational History  ? Occupation: marketing  ?  Employer: [employer]  ?Tobacco Use  ? Smoking status: Some Days  ?  Packs/day: 0.25  ?  Types: Cigarettes  ? Smokeless tobacco: Never  ? Tobacco comments:  ?  less then 1/2 a pack a day.  prev able to tolerate chantix  ?Vaping Use  ? Vaping Use: Never used  ?Substance and Sexual Activity  ? Alcohol use: Yes  ?  Comment: social drinker  ? Drug use: No  ? Sexual activity: Not on file  ?Other Topics Concern  ? Not on file  ?Social History Narrative  ? Not on file  ? ?Social Determinants of Health  ? ?Financial Resource Strain: Not on file  ?Food Insecurity: Not on file  ?Transportation Needs: Not on file  ?Physical Activity: Not on file  ?Stress: Not on file  ?Social Connections: Not on file  ?Intimate Partner Violence: Not on file  ? ? ?Review of Systems: ? ?All other review of systems negative except as mentioned in the HPI. ? ?Physical Exam: ?Vital signs in last 24 hours: ?BP 117/62    Pulse 68   Temp 98.7 ?F (37.1 ?C)   Ht '5\' 3"'$  (1.6 m)   Wt 143 lb (64.9 kg)   SpO2 97%   BMI 25.33 kg/m?  ?General:   Alert, NAD ?Lungs:  Clear .   ?Heart:  Regular rate and rhythm ?Abdomen:  Soft, nontender and nondistended. ?N

## 2021-05-06 NOTE — Patient Instructions (Signed)
Information on polyps, and hiatal hernia given to you today. ? ?Await pathology results. ? ?Resume previous diet and medications. ? ?Use Nexium 40 mg by mouth daily ? ?Use Pepcid 20 mg by mouth each day at bedtime as needed. ? ? ?YOU HAD AN ENDOSCOPIC PROCEDURE TODAY AT [facility]:   Refer to the procedure report that was given to you for any specific questions about what was found during the examination.  If the procedure report does not answer your questions, please call your gastroenterologist to clarify.  If you requested that your care partner not be given the details of your procedure findings, then the procedure report has been included in a sealed envelope for you to review at your convenience later. ? ?YOU SHOULD EXPECT: Some feelings of bloating in the abdomen. Passage of more gas than usual.  Walking can help get rid of the air that was put into your GI tract during the procedure and reduce the bloating. If you had a lower endoscopy (such as a colonoscopy or flexible sigmoidoscopy) you may notice spotting of blood in your stool or on the toilet paper. If you underwent a bowel prep for your procedure, you may not have a normal bowel movement for a few days. ? ?Please Note:  You might notice some irritation and congestion in your nose or some drainage.  This is from the oxygen used during your procedure.  There is no need for concern and it should clear up in a day or so. ? ?SYMPTOMS TO REPORT IMMEDIATELY: ? ?Following lower endoscopy (colonoscopy or flexible sigmoidoscopy): ? Excessive amounts of blood in the stool ? Significant tenderness or worsening of abdominal pains ? Swelling of the abdomen that is new, acute ? Fever of 100?F or higher ? ?Following upper endoscopy (EGD) ? Vomiting of blood or coffee ground material ? New chest pain or pain under the shoulder blades ? Painful or persistently difficult swallowing ? New shortness of breath ? Fever of 100?F or higher ? Black,  tarry-looking stools ? ?For urgent or emergent issues, a gastroenterologist can be reached at any hour by calling [phone]. ?Do not use MyChart messaging for urgent concerns.  ? ? ?DIET:  We do recommend a small meal at first, but then you may proceed to your regular diet.  Drink plenty of fluids but you should avoid alcoholic beverages for 24 hours. ? ?ACTIVITY:  You should plan to take it easy for the rest of today and you should NOT DRIVE or use heavy machinery until tomorrow (because of the sedation medicines used during the test).   ? ?FOLLOW UP: ?Our staff will call the number listed on your records 48-72 hours following your procedure to check on you and address any questions or concerns that you may have regarding the information given to you following your procedure. If we do not reach you, we will leave a message.  We will attempt to reach you two times.  During this call, we will ask if you have developed any symptoms of COVID 19. If you develop any symptoms (ie: fever, flu-like symptoms, shortness of breath, cough etc.) before then, please call [phone].  If you test positive for Covid 19 in the 2 weeks post procedure, please call and report this information to [place].   ? ?If any biopsies were taken you will be contacted by phone or by letter within the next 1-3 weeks.  Please call us at [phone] if you have not  heard about the biopsies in 3 weeks.  ? ? ?SIGNATURES/CONFIDENTIALITY: ?You and/or your care partner have signed paperwork which will be entered into your electronic medical record.  These signatures attest to the fact that that the information above on your After Visit Summary has been reviewed and is understood.  Full responsibility of the confidentiality of this discharge information lies with you and/or your care-partner.  ?

## 2021-05-09 ENCOUNTER — Telehealth: Payer: Self-pay | Admitting: *Deleted

## 2021-05-09 ENCOUNTER — Telehealth: Payer: Self-pay

## 2021-05-09 NOTE — Telephone Encounter (Signed)
Left message on follow up call. 

## 2021-05-09 NOTE — Telephone Encounter (Signed)
?  Follow up Call- ? ? ?  05/06/2021  ?  2:50 PM  ?Call back number  ?Post procedure Call Back phone  # [phone]  ?Permission to leave phone message Yes  ?  ? ?Patient questions: ?Message left to call us if necessary. ?

## 2021-05-13 ENCOUNTER — Encounter: Payer: Self-pay | Admitting: Gastroenterology

## 2021-05-17 ENCOUNTER — Ambulatory Visit: Payer: Managed Care, Other (non HMO) | Admitting: Family Medicine

## 2021-05-25 ENCOUNTER — Encounter: Payer: Self-pay | Admitting: Family Medicine

## 2021-05-25 ENCOUNTER — Telehealth: Payer: Self-pay | Admitting: Family Medicine

## 2021-05-25 ENCOUNTER — Ambulatory Visit (INDEPENDENT_AMBULATORY_CARE_PROVIDER_SITE_OTHER): Payer: Managed Care, Other (non HMO) | Admitting: Family Medicine

## 2021-05-25 VITALS — BP 122/78 | HR 66 | Temp 98.1°F | Ht 63.0 in | Wt 142.5 lb

## 2021-05-25 DIAGNOSIS — E785 Hyperlipidemia, unspecified: Secondary | ICD-10-CM

## 2021-05-25 DIAGNOSIS — G43009 Migraine without aura, not intractable, without status migrainosus: Secondary | ICD-10-CM | POA: Diagnosis not present

## 2021-05-25 DIAGNOSIS — F39 Unspecified mood [affective] disorder: Secondary | ICD-10-CM

## 2021-05-25 DIAGNOSIS — E559 Vitamin D deficiency, unspecified: Secondary | ICD-10-CM

## 2021-05-25 DIAGNOSIS — Z8673 Personal history of transient ischemic attack (TIA), and cerebral infarction without residual deficits: Secondary | ICD-10-CM

## 2021-05-25 DIAGNOSIS — F17200 Nicotine dependence, unspecified, uncomplicated: Secondary | ICD-10-CM | POA: Diagnosis not present

## 2021-05-25 MED ORDER — ONDANSETRON HCL 4 MG PO TABS: 4.0000 mg | ORAL_TABLET | Freq: Three times a day (TID) | ORAL | 3 refills | Status: DC | PRN

## 2021-05-25 MED ORDER — ATORVASTATIN CALCIUM 40 MG PO TABS: 40.0000 mg | ORAL_TABLET | Freq: Every day | ORAL | 3 refills | Status: DC

## 2021-05-25 MED ORDER — TOPIRAMATE 50 MG PO TABS: 50.0000 mg | ORAL_TABLET | Freq: Every evening | ORAL | 3 refills | Status: DC

## 2021-05-25 MED ORDER — NURTEC 75 MG PO TBDP: 1.0000 | ORAL_TABLET | Freq: Every day | ORAL | 3 refills | Status: DC | PRN

## 2021-05-25 MED ORDER — BUTALBITAL-APAP-CAFFEINE 50-325-40 MG PO TABS
1.0000 | ORAL_TABLET | Freq: Two times a day (BID) | ORAL | 0 refills | Status: DC | PRN
Start: 2021-05-25 — End: 2021-06-27

## 2021-05-25 NOTE — Telephone Encounter (Signed)
Pt stated that her labs for cpe should go through [facility]

## 2021-05-25 NOTE — Telephone Encounter (Signed)
Attempted to contact pt.  Vm box is full.  Need to notify pt she needs to let the lab know her blood work should go to [facility] when she gets it drawn at [facility].   Fwd to Dr. Darnell Level to order cpe labs.

## 2021-05-25 NOTE — Progress Notes (Unsigned)
  Patient ID: Kristie Miles, female    DOB: 04/29/1965, 55 y.o.   MRN: [id number]  This visit was conducted in person.  BP 122/78   Pulse 66   Temp 98.1 F (36.7 C) (Temporal)   Ht 5' 3" (1.6 m)   Wt 142 lb 8 oz (64.6 kg)   SpO2 96%   BMI 25.24 kg/m    CC: HA f/u visit  Subjective:   HPI: Kristie Miles is a 55 y.o. female presenting on 05/25/2021 for Follow-up (Here for chol and HA. C/o recent terrible HA. )   She is fasting today, but did drink green tea with honey this morning.   HLD - on atorvastatin 40mg daily, tolerating well. Due for rpt labs. Fenofibrate stopped last year.   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 She's been taking topamax 50mg nightly as well as excedrin/fioricet abortively with benefit. Avoiding triptans in CVA history. Triggers are increased stress and barometric pressure changes. Nurtec started 07/2020 with benefit.   Acute worsening of migraines these past 2 weeks (almost every day), correlating to increased levels of stress (family illness). She ran out of nurtec last month then had difficultly filling due to insurance issue. This was approved last month, however she was not notified. Notes severe nausea when she gets headaches - manages with zofran with benefit. She actually also stopped topamax because she ran out, again had difficulty filling. She is also planning to have eyes checked.   [facility]eds meds sent [facility] or [facility] due to insurance  Sees neurology for essential tremor, last seen 11/2020, on primidone and propranolol with benefit.   Significantly has cut down on smoking until the past 2 weeks - maybe 1 in evening after work.      Relevant past medical, surgical, family and social history reviewed and updated as indicated. Interim medical history since our last visit reviewed. Allergies and medications reviewed and updated. Outpatient  Medications Prior to Visit  Medication Sig Dispense Refill   aspirin 81 MG EC tablet Take 1 tablet (81 mg total) by mouth daily. Swallow whole.     atorvastatin (LIPITOR) 40 MG tablet TAKE 1 TABLET BY MOUTH DAILY 90 tablet 0   butalbital-acetaminophen-caffeine (FIORICET) 50-325-40 MG tablet Take 1 tablet by mouth 2 (two) times daily as needed for migraine (sedation precautions). 30 tablet 0   Cholecalciferol (VITAMIN D3 PO) Take by mouth daily.     Cyanocobalamin (B-12 PO) Take by mouth daily.     esomeprazole (NEXIUM) 40 MG capsule Take 1 capsule (40 mg total) by mouth daily at 12 noon. 90 capsule 3   estradiol (ESTRACE) 0.1 MG/GM vaginal cream Place vaginally.     Flaxseed, Linseed, (FLAX SEEDS PO) Take by mouth.     ibuprofen (ADVIL) 800 MG tablet TAKE 1 TABLET BY MOUTH THREE TIMES A DAY 90 tablet 1   lidocaine (XYLOCAINE) 5 % ointment Apply 1 application. topically as needed.     Multiple Vitamins-Minerals (CENTRUM PO) Take by mouth daily.     Nutritional Supplements (ESTROVEN PO) Take by mouth.     ondansetron (ZOFRAN) 4 MG tablet Take 1 tablet (4 mg total) by mouth every 8 (eight) hours as needed. 20 tablet 0   PARoxetine (PAXIL) 40 MG tablet TAKE 1 TABLET(40 MG) BY MOUTH EVERY MORNING 90 tablet 2   primidone (MYSOLINE) 50 MG tablet Take 2 tablets (100 mg total) by mouth at   bedtime. 180 tablet 3   propranolol ER (INDERAL LA) 160 MG SR capsule Take 1 capsule (160 mg total) by mouth daily. 90 capsule 3   Rhubarb (ESTROVEN MENOPAUSE RELIEF) 4 MG TABS Take by mouth.     Rimegepant Sulfate (NURTEC) 75 MG TBDP Take 1 tablet by mouth daily as needed (migraine). 15 tablet 1   valACYclovir (VALTREX) 500 MG tablet TAKE 1 TABLET BY MOUTH TWICE A DAY AS NEEDED 180 tablet 1   varenicline (CHANTIX) 0.5 MG tablet TAKE 1 TABLET BY MOUTH TWICE A DAY 60 tablet 0   esomeprazole (NEXIUM) 40 MG capsule Take 1 capsule (40 mg total) by mouth 2 (two) times daily before a meal. 60 capsule 1   BINAXNOW COVID-19  AG HOME TEST KIT  (Patient not taking: Reported on 05/06/2021)     topiramate (TOPAMAX) 50 MG tablet Take 1 tablet (50 mg total) by mouth at bedtime. (Patient not taking: Reported on 04/04/2021) 90 tablet 1   Facility-Administered Medications Prior to Visit  Medication Dose Route Frequency Provider Last Rate Last Admin   famotidine (PEPCID) tablet 20 mg  20 mg Oral Daily Nandigam, Kavitha V, MD         Per HPI unless specifically indicated in ROS section below Review of Systems  Objective:  BP 122/78   Pulse 66   Temp 98.1 F (36.7 C) (Temporal)   Ht 5' 3" (1.6 m)   Wt 142 lb 8 oz (64.6 kg)   SpO2 96%   BMI 25.24 kg/m   Wt Readings from Last 3 Encounters:  05/25/21 142 lb 8 oz (64.6 kg)  05/06/21 143 lb (64.9 kg)  04/04/21 143 lb 8 oz (65.1 kg)      Physical Exam    Results for orders placed or performed in visit on 67/89/38  Basic metabolic panel  Result Value Ref Range   Glucose 119    Creatinine 0.7 0.5 - 1.1  Lipid panel  Result Value Ref Range   Triglycerides 426 (A) 40 - 160   Cholesterol 269 (A) 0 - 200   HDL 48 35 - 70   LDL Cholesterol 142   Hemoglobin A1c  Result Value Ref Range   Hemoglobin A1C 6.0    Lab Results  Component Value Date   CREATININE 0.7 06/28/2020   BUN 17 06/28/2019   NA 137 06/28/2019   K 4.1 06/28/2019   CL 103 06/28/2019   CO2 24 06/28/2019    Lab Results  Component Value Date   WBC 7.1 06/28/2019   HGB 13.4 06/28/2019   HCT 39.2 06/28/2019   MCV 95.1 06/28/2019   PLT 324 06/28/2019    Lab Results  Component Value Date   TSH 0.965 07/12/2020    Assessment & Plan:   Problem List Items Addressed This Visit   None    No orders of the defined types were placed in this encounter.  No orders of the defined types were placed in this encounter.    Patient Instructions  Go to basement of [facility] building for fasting labs in the next week. 4 hour fast - ok to take medicines, drink water, have black coffee.  Restart  topamax 86m nightly, other migraine medicine refilled ([facility]  Return in 3-4 months for physical.   Follow up plan: No follow-ups on file.  JRia Bush MD

## 2021-05-25 NOTE — Patient Instructions (Addendum)
Go to basement of [facility] building for fasting labs in the next week. 4 hour fast - ok to take medicines, drink water, have black coffee.  Restart topamax '50mg'$  nightly, other migraine medicine refilled (fioricet, zofran, and nurtec all to [facility]).  Return in 3-4 months for physical.

## 2021-05-26 DIAGNOSIS — E559 Vitamin D deficiency, unspecified: Secondary | ICD-10-CM | POA: Insufficient documentation

## 2021-05-26 NOTE — Assessment & Plan Note (Signed)
Both elevated LDL and triglycerides.  Last year we stopped fenofibrate '54mg'$  and increased atorvastatin to '40mg'$  daily. Due for rpt lipid panel but not fasting today - will return for this. Anticipate familial component given marked elevation.  The 10-year ASCVD risk score (Arnett DK, et al., 2019) is: 7.1%*   Values used to calculate the score:     Age: 56 years     Sex: Female     Is Non-Hispanic African American: No     Diabetic: No     Tobacco smoker: Yes     Systolic Blood Pressure: 875 mmHg     Is BP treated: No     HDL Cholesterol: 48 mg/dL*     Total Cholesterol: 269 mg/dL*     * - Cholesterol units were assumed for this score calculation

## 2021-05-26 NOTE — Telephone Encounter (Signed)
I ordered labs to [facility].

## 2021-05-26 NOTE — Assessment & Plan Note (Signed)
Continue statin, aspirin.  Avoid triptans.

## 2021-05-26 NOTE — Assessment & Plan Note (Signed)
Congratulated on significant decrease in smoking! Back to minimal smoking due to recent increased stressors.  Motivated to continue working on complete cessation.

## 2021-05-26 NOTE — Assessment & Plan Note (Addendum)
Ran out of topamax and nurtec several months ago, acute worsening of migraines 2 wks ago in setting of increased stress. Has only been using excedrin and fioricet PRN, with non-optimal current migraine control.  Nurtec was again approved by insurance however pt was not notified.  Will refill preventative topamax '50mg'$  nightly as well as PRN Nurtec, fioricet and zofran prn nausea to have on hand. Nurtec has been well tolerated and beneficial.  Triptans contraindicated in h/o CVA.

## 2021-05-26 NOTE — Assessment & Plan Note (Signed)
Continues paxil through GYN

## 2021-05-27 ENCOUNTER — Ambulatory Visit: Payer: Managed Care, Other (non HMO) | Admitting: Family Medicine

## 2021-06-13 ENCOUNTER — Ambulatory Visit: Payer: Managed Care, Other (non HMO) | Admitting: Family Medicine

## 2021-06-13 ENCOUNTER — Encounter: Payer: Self-pay | Admitting: Family Medicine

## 2021-06-13 VITALS — BP 124/72 | HR 69 | Temp 97.2°F | Ht 63.0 in | Wt 143.0 lb

## 2021-06-13 DIAGNOSIS — B009 Herpesviral infection, unspecified: Secondary | ICD-10-CM

## 2021-06-13 DIAGNOSIS — F17200 Nicotine dependence, unspecified, uncomplicated: Secondary | ICD-10-CM

## 2021-06-13 DIAGNOSIS — S29012A Strain of muscle and tendon of back wall of thorax, initial encounter: Secondary | ICD-10-CM | POA: Insufficient documentation

## 2021-06-13 MED ORDER — VALACYCLOVIR HCL 1 G PO TABS: 2000.0000 mg | ORAL_TABLET | Freq: Two times a day (BID) | ORAL | 3 refills | Status: DC

## 2021-06-13 MED ORDER — METHOCARBAMOL 500 MG PO TABS: 500.0000 mg | ORAL_TABLET | Freq: Three times a day (TID) | ORAL | 0 refills | Status: DC | PRN

## 2021-06-13 MED ORDER — VARENICLINE TARTRATE 0.5 MG PO TABS: 0.5000 mg | ORAL_TABLET | Freq: Two times a day (BID) | ORAL | 0 refills | Status: DC

## 2021-06-13 NOTE — Progress Notes (Signed)
Patient ID: Kristie Miles, female    DOB: 01-31-1965, 56 y.o.   MRN: [id number]  This visit was conducted in person.  BP 124/72   Pulse 69   Temp (!) 97.2 F (36.2 C) (Temporal)   Ht '5\' 3"'$  (1.6 m)   Wt 143 lb (64.9 kg)   SpO2 97%   BMI 25.33 kg/m    CC: upper back pain Subjective:   HPI: Kristie Miles is a 56 y.o. female presenting on 06/13/2021 for Back Pain (C/o center upper back pain. Started 06/01/21. Thought she had pulled something. Pain is off and on. )   2 wk h/o midline upper thoracic spine into cervical spine without radiation. Described as dull ache. At its works 9/10. Has led to tears due to pain.  Denies inciting trauma/injury or falls.  Started after doing some yard work.  Pain intensifies as day goes on.   Tried aleve, ibuprofen, tylenol and bayer aspirin, ice/heat without benefit.   Has been able to quit with chantix - taking 0.'5mg'$  once daily. Has been using low dose for 3 months.      Relevant past medical, surgical, family and social history reviewed and updated as indicated. Interim medical history since our last visit reviewed. Allergies and medications reviewed and updated. Outpatient Medications Prior to Visit  Medication Sig Dispense Refill   aspirin 81 MG EC tablet Take 1 tablet (81 mg total) by mouth daily. Swallow whole.     atorvastatin (LIPITOR) 40 MG tablet Take 1 tablet (40 mg total) by mouth daily. 90 tablet 3   butalbital-acetaminophen-caffeine (FIORICET) 50-325-40 MG tablet Take 1 tablet by mouth 2 (two) times daily as needed for migraine (sedation precautions). 30 tablet 0   Cholecalciferol (VITAMIN D3 PO) Take by mouth daily.     Cyanocobalamin (B-12 PO) Take by mouth daily.     esomeprazole (NEXIUM) 40 MG capsule Take 1 capsule (40 mg total) by mouth daily at 12 noon. 90 capsule 3   estradiol (ESTRACE) 0.1 MG/GM vaginal cream Place vaginally.     Flaxseed, Linseed, (FLAX SEEDS PO) Take by mouth.     ibuprofen (ADVIL) 800 MG tablet TAKE 1  TABLET BY MOUTH THREE TIMES A DAY 90 tablet 1   lidocaine (XYLOCAINE) 5 % ointment Apply 1 application. topically as needed.     Multiple Vitamins-Minerals (CENTRUM PO) Take by mouth daily.     Nutritional Supplements (ESTROVEN PO) Take by mouth.     ondansetron (ZOFRAN) 4 MG tablet Take 1 tablet (4 mg total) by mouth every 8 (eight) hours as needed. 20 tablet 3   PARoxetine (PAXIL) 40 MG tablet Take 1 tablet (40 mg total) by mouth every morning. 90 tablet 2   primidone (MYSOLINE) 50 MG tablet Take 2 tablets (100 mg total) by mouth at bedtime. 180 tablet 3   propranolol ER (INDERAL LA) 160 MG SR capsule Take 1 capsule (160 mg total) by mouth daily. 90 capsule 3   Rhubarb (ESTROVEN MENOPAUSE RELIEF) 4 MG TABS Take by mouth.     Rimegepant Sulfate (NURTEC) 75 MG TBDP Take 1 tablet by mouth daily as needed (migraine). 15 tablet 3   topiramate (TOPAMAX) 50 MG tablet Take 1 tablet (50 mg total) by mouth at bedtime. 90 tablet 3   valACYclovir (VALTREX) 500 MG tablet TAKE 1 TABLET BY MOUTH TWICE A DAY AS NEEDED 180 tablet 1   varenicline (CHANTIX) 0.5 MG tablet TAKE 1 TABLET BY MOUTH TWICE A DAY 60 tablet 0  Facility-Administered Medications Prior to Visit  Medication Dose Route Frequency Provider Last Rate Last Admin   famotidine (PEPCID) tablet 20 mg  20 mg Oral Daily Nandigam, Kavitha V, MD         Per HPI unless specifically indicated in ROS section below Review of Systems  Objective:  BP 124/72   Pulse 69   Temp (!) 97.2 F (36.2 C) (Temporal)   Ht '5\' 3"'$  (1.6 m)   Wt 143 lb (64.9 kg)   SpO2 97%   BMI 25.33 kg/m   Wt Readings from Last 3 Encounters:  06/13/21 143 lb (64.9 kg)  05/25/21 142 lb 8 oz (64.6 kg)  05/06/21 143 lb (64.9 kg)      Physical Exam Vitals and nursing note reviewed.  Constitutional:      Appearance: Normal appearance. She is not ill-appearing.  Musculoskeletal:        General: Tenderness present. Normal range of motion.     Comments:  No midline thoracic  spine tenderness Discomfort to palpation along R rhomboid mm No pain at trapezius mm bilaterally FROM at cervical neck, reproduces pulling sensation to upper R back  Skin:    General: Skin is warm and dry.     Findings: No erythema or rash.  Neurological:     Mental Status: She is alert.  Psychiatric:        Mood and Affect: Mood normal.        Behavior: Behavior normal.        Assessment & Plan:   Problem List Items Addressed This Visit     Smoker    chantix is working well, only using 0.'5mg'$  once daily due to nausea side effect otherwise.       Herpes simplex    Discussed herpes labialis (fever blister) dosing, refilled accordingly       Relevant Medications   valACYclovir (VALTREX) 1000 MG tablet   Strain of right rhomboid muscle - Primary    Story/exam most consistent with this. rec ibuprofen '800mg'$  1/2 tab at lunch at 1 tab at dinner x 1-2 wks scheduled, add robaxin muscle relaxant, continue heating pad to back.  Update if not improved with above.         Meds ordered this encounter  Medications   varenicline (CHANTIX) 0.5 MG tablet    Sig: Take 1 tablet (0.5 mg total) by mouth 2 (two) times daily.    Dispense:  60 tablet    Refill:  0   valACYclovir (VALTREX) 1000 MG tablet    Sig: Take 2 tablets (2,000 mg total) by mouth 2 (two) times daily for 1 day. As needed for fever blister    Dispense:  20 tablet    Refill:  3   methocarbamol (ROBAXIN) 500 MG tablet    Sig: Take 1 tablet (500 mg total) by mouth 3 (three) times daily as needed for muscle spasms (sedation precautions).    Dispense:  40 tablet    Refill:  0   No orders of the defined types were placed in this encounter.    Patient Instructions  I think you have rhomboid strain - do exercises provided today Continue heating pad.  Continue ibuprofen '800mg'$  take every night with dinner, maybe even 1/2 tablet with lunch for next week. Add muscle relaxant robaxin '500mg'$  2-3 times daily.   Follow up  plan: Return if symptoms worsen or fail to improve.  Ria Bush, MD

## 2021-06-13 NOTE — Assessment & Plan Note (Signed)
chantix is working well, only using 0.'5mg'$  once daily due to nausea side effect otherwise.

## 2021-06-13 NOTE — Assessment & Plan Note (Signed)
Discussed herpes labialis (fever blister) dosing, refilled accordingly

## 2021-06-13 NOTE — Assessment & Plan Note (Signed)
Story/exam most consistent with this. rec ibuprofen '800mg'$  1/2 tab at lunch at 1 tab at dinner x 1-2 wks scheduled, add robaxin muscle relaxant, continue heating pad to back.  Update if not improved with above.

## 2021-06-13 NOTE — Patient Instructions (Addendum)
I think you have rhomboid strain - do exercises provided today Continue heating pad.  Continue ibuprofen '800mg'$  take every night with dinner, maybe even 1/2 tablet with lunch for next week. Add muscle relaxant robaxin '500mg'$  2-3 times daily.

## 2021-06-17 ENCOUNTER — Encounter: Payer: Self-pay | Admitting: Family Medicine

## 2021-06-27 ENCOUNTER — Other Ambulatory Visit: Payer: Self-pay | Admitting: Family Medicine

## 2021-06-27 MED ORDER — BUTALBITAL-APAP-CAFFEINE 50-325-40 MG PO TABS: 1.0000 | ORAL_TABLET | Freq: Two times a day (BID) | ORAL | 0 refills | Status: DC | PRN

## 2021-06-28 ENCOUNTER — Ambulatory Visit: Payer: Managed Care, Other (non HMO) | Admitting: Family Medicine

## 2021-07-18 ENCOUNTER — Other Ambulatory Visit: Payer: Self-pay | Admitting: Family Medicine

## 2021-07-18 NOTE — Telephone Encounter (Signed)
Name of Medication: Fioricet Name of Pharmacy: [facility] or Written Date and Quantity: 06/27/21, #30 Last Office Visit and Type: 06/13/21, back pain Next Office Visit and Type: 08/24/21, CPE Last Controlled Substance Agreement Date: none Last UDS: none

## 2021-07-19 MED ORDER — BUTALBITAL-APAP-CAFFEINE 50-325-40 MG PO TABS: 1.0000 | ORAL_TABLET | Freq: Two times a day (BID) | ORAL | 0 refills | Status: DC | PRN

## 2021-07-19 NOTE — Telephone Encounter (Signed)
ERx 

## 2021-07-30 ENCOUNTER — Encounter: Payer: Self-pay | Admitting: Family Medicine

## 2021-07-30 DIAGNOSIS — G43009 Migraine without aura, not intractable, without status migrainosus: Secondary | ICD-10-CM

## 2021-08-02 ENCOUNTER — Other Ambulatory Visit: Payer: Self-pay | Admitting: Family Medicine

## 2021-08-02 NOTE — Telephone Encounter (Signed)
Duplicate request.  Rx sent on 07/19/21 #30/0.

## 2021-08-03 MED ORDER — BUTALBITAL-APAP-CAFFEINE 50-325-40 MG PO TABS: 1.0000 | ORAL_TABLET | Freq: Two times a day (BID) | ORAL | 0 refills | Status: DC | PRN

## 2021-08-03 NOTE — Telephone Encounter (Signed)
ERx 

## 2021-08-05 MED ORDER — TOPIRAMATE 50 MG PO TABS
50.0000 mg | ORAL_TABLET | Freq: Two times a day (BID) | ORAL | 1 refills | Status: DC
Start: 2021-08-05 — End: 2021-11-11

## 2021-08-05 NOTE — Addendum Note (Signed)
Addended by: Ria Bush on: 08/05/2021 04:53 PM   Modules accepted: Orders

## 2021-08-15 ENCOUNTER — Telehealth: Payer: Self-pay | Admitting: Family Medicine

## 2021-08-15 DIAGNOSIS — Z8673 Personal history of transient ischemic attack (TIA), and cerebral infarction without residual deficits: Secondary | ICD-10-CM

## 2021-08-15 DIAGNOSIS — G43009 Migraine without aura, not intractable, without status migrainosus: Secondary | ICD-10-CM

## 2021-08-15 NOTE — Telephone Encounter (Addendum)
Plz notify - [facility] recommends neurology evaluation initially prior to seeing patient. I have placed neurology referral in [place] .

## 2021-08-15 NOTE — Telephone Encounter (Signed)
Sarah from [facility] called in and stated that [facility] referral is being rejected. She stated that she has a history of strokes and will need to be seeing a neurologist. She stated that the neurologist can than send a referral over to them to be seen by them if need be. Thank you!

## 2021-08-15 NOTE — Addendum Note (Signed)
Addended by: Ria Bush on: 08/15/2021 06:03 PM   Modules accepted: Orders

## 2021-08-18 ENCOUNTER — Encounter: Payer: Self-pay | Admitting: Family Medicine

## 2021-08-18 ENCOUNTER — Other Ambulatory Visit: Payer: Self-pay | Admitting: Family Medicine

## 2021-08-18 NOTE — Telephone Encounter (Signed)
Name of Medication: Fioricet Name of Pharmacy: [facility] or Written Date and Quantity: 08/03/21, #30 Last Office Visit and Type: 06/13/21, back pain Next Office Visit and Type: 08/24/21, CPE Last Controlled Substance Agreement Date: none Last UDS: none

## 2021-08-19 MED ORDER — BUTALBITAL-APAP-CAFFEINE 50-325-40 MG PO TABS: 1.0000 | ORAL_TABLET | Freq: Two times a day (BID) | ORAL | 0 refills | Status: DC | PRN

## 2021-08-19 NOTE — Telephone Encounter (Signed)
ERx 

## 2021-08-24 ENCOUNTER — Encounter: Payer: Managed Care, Other (non HMO) | Admitting: Family Medicine

## 2021-08-28 ENCOUNTER — Other Ambulatory Visit: Payer: Self-pay | Admitting: Family Medicine

## 2021-08-28 DIAGNOSIS — K219 Gastro-esophageal reflux disease without esophagitis: Secondary | ICD-10-CM

## 2021-09-02 ENCOUNTER — Other Ambulatory Visit: Payer: Self-pay | Admitting: Family Medicine

## 2021-09-02 NOTE — Telephone Encounter (Signed)
Duplicate request.  Rx sent on 08/19/21, #30/0 to [facility].

## 2021-09-05 MED ORDER — BUTALBITAL-APAP-CAFFEINE 50-325-40 MG PO TABS
1.0000 | ORAL_TABLET | Freq: Two times a day (BID) | ORAL | 0 refills | Status: DC | PRN
Start: 2021-09-05 — End: 2021-09-19

## 2021-09-05 NOTE — Telephone Encounter (Signed)
ERx 

## 2021-09-07 ENCOUNTER — Encounter: Payer: Self-pay | Admitting: Neurology

## 2021-09-07 ENCOUNTER — Ambulatory Visit: Payer: Managed Care, Other (non HMO) | Admitting: Neurology

## 2021-09-07 VITALS — BP 114/73 | HR 59 | Ht 64.0 in | Wt 140.0 lb

## 2021-09-07 DIAGNOSIS — G250 Essential tremor: Secondary | ICD-10-CM

## 2021-09-07 DIAGNOSIS — R519 Headache, unspecified: Secondary | ICD-10-CM | POA: Diagnosis not present

## 2021-09-07 DIAGNOSIS — Z820 Family history of epilepsy and other diseases of the nervous system: Secondary | ICD-10-CM | POA: Diagnosis not present

## 2021-09-07 DIAGNOSIS — G43019 Migraine without aura, intractable, without status migrainosus: Secondary | ICD-10-CM

## 2021-09-07 MED ORDER — UBRELVY 100 MG PO TABS: 100.0000 mg | ORAL_TABLET | ORAL | 3 refills | Status: DC | PRN

## 2021-09-07 NOTE — Progress Notes (Signed)
Subjective:    Patient ID: Kristie Miles is a 56 y.o. female.  HPI    Interim history:   Dear Dr. Danise Mina,  I saw your patient, Kimyata Milich, upon your kind request in my neurologic clinic today for evaluation of her migraine headaches.  The patient is unaccompanied today.  She has been followed in this clinic for essential tremor.  As you know, Ms. Siemon is a 56 year old right-handed woman with an underlying medical history of reflux disease, history of TIA, cerebellar stroke, hyperlipidemia, smoking, depression, anxiety, arthritis, allergies, and borderline overweight state, who reports a longstanding history of migraine headaches, since she has been in college.  She has a strong family history of migraines as well, her father has migraines, paternal grandmother had migraines and her own mom had cluster headaches and ocular migraines.  Her daughter has migraines as well and a nephew.  She is currently on Topamax, has not quite increased it to 50 mg twice daily but is supposed to increase it.  Nurtec has not helped, she has not tried [place] yet.  She has been using Fioricet sparingly which helps.  She has tried and failed nortriptyline, propranolol, has been on Topamax, and has been on Paxil, no longer takes Robaxin which was for back pain.  She has not been on a monthly injectable such as Aimovig or Ajovy or Emgality, she has not been on Botox but would be interested in pursuing Botox injections, her nephew gets them.   She is on propranolol for tremors.  She also takes Mysoline for tremor control.    She had a brain MRI without contrast on 08/31/2020 and I reviewed the results:   IMPRESSION: 1. No evidence of acute intracranial abnormality 2. Mild chronic microvascular ischemic disease and remote right cerebellar infarct. 3. Moderate paranasal sinus mucosal.   She has not noticed any obvious triggers.  In the past 6 months she has had nearly daily headaches.  She drinks caffeine in  the form of green tea, typically 1 cup/day.  She quit smoking about a month ago.  She is off Chantix now.  She drinks alcohol occasionally.  She sleeps well, tries to get about 7 to 8 hours of sleep.  She has some snoring but no apneas reported per husband.  She wakes up well rested.  She does not have any visual auras for her migraines, no history of stroke symptoms but was found to have a lacunar stroke.  She has had no associated one-sided weakness or numbness or tingling or droopy face or slurring of speech.  With her migraine she has often left-sided symptoms with throbbing headache, sometimes it is a constant pressure-like pain.  She has associated nausea and light sensitivity typically.  She is due for her eye examination.  She has prescription eyeglasses and feels it may need to be updated.  Previously:   She was seen by Ward Givens, NP on 12/01/2020, at which time she was on primidone 100 mg at bedtime and Inderal long-acting 160 mg daily.  She was seen by Ward Givens, NP on 09/24/2019, at which time she reported that tremors had improved after she stopped smoking and stopped the Chantix.  She was on nortriptyline at the time for migraine prevention.  She was seen by Ward Givens, NP on 03/10/2019, at which time she felt that primidone or propranolol were helpful for her tremors.  09/10/18: 56 year old right-handed woman with an underlying medical history of reflux disease, hyperlipidemia, migraine headaches, smoking, depression and  mildly overweight state, who presents for follow-up consultation of her essential tremor. The patient is unaccompanied today. I last saw her on 03/14/2018, at which time she reported that her tremor was worse.  She was on her beta-blocker.  She was particularly stressed out because of her wedding that was planned for April.  She was able to tolerate the beta-blocker.  I suggested we add Mysoline to her regimen, starting at 50 mg strength half a pill with gradual  titration.   Today, 09/10/2018: She reports that the Mysoline has been helpful.  She has been taking 100 mg each night.  She denies any significant side effects such as headaches or sleepiness.  She reports that her balance has not been very good but this is not a new problem since the primidone.  She has had some short-term memory issues, forgetfulness and forgetting conversations.  She is somewhat worried about this.  She has not had a physical with her primary care nurse practitioner and has not had any recent blood work.  She continues to take the propranolol.  She limits her caffeine to 1 serving of green tea and otherwise water.  She tries to hydrate well.  She does drink alcohol daily in the form of wine, 1 to 2 glasses/day on average.  She is motivated to quit smoking and also to curb her alcohol intake.  She has no family history of dementia.  Father died young with cancer at age 72, mother is 34 years old and has a tremor but good memory and neither grandparents set had any dementia as far as she recalls.  She is due for an eye examination, it has been a year, she has prescription eyeglasses.   I first met her on 09/06/2017 at the request of her primary care physician, at which time she reported in at least 10 year history of hand tremors. She was on propranolol at the time. She was on completely sure about the dose and I suggested we increase it from what she thought she was on at the time. She called back later reporting that she was taking propranolol 60 mg twice a day. I suggested we change the prescription to 160 mg once daily long-acting. She had previously followed with Dr. Melrose Nakayama in neurology.    09/06/2017: (She) reports in at least 10 year history of hand tremors. She recalls that she had a mild tremor in her early 80s. Her tremor has progressed with time. I reviewed your office note from 07/16/2017. She has been on propranolol for tremors and previously followed with Dr. Melrose Nakayama at [facility]  clinic neurology. She feels that the propranolol is not as effective as it used to be. She has a tremor in both hands. She works for [employer]. She is engaged and lives with her fianc and their kids. She has 1 biological daughter, age 4. She has noticed that her daughter has had some hand tremors. Her mother developed tremor later in life and is on propranolol as well. Patient is trying to quit smoking. She smokes half a pack per day, drinks alcohol about 3 days out of the week, typically in the form of wine, a little more consistently on the weekends. She has not actually noticed any improvement in her tremor after consuming alcohol. She is trying to cut back because of recent weight gain. She is typically well rested and well hydrated with water. She currently works from home as she had right foot surgery for bone spur and plantar  fasciitis in August 2019. She had left foot surgery in November 2018. She is currently wearing a boot on the right foot. She is currently on gabapentin 200 mg twice a day for migraine prevention which has worked well for her. She has tried Topamax in the distant past it looks like. She is on generic Paxil 40 mg daily for her depression which has worked well for her. In the past, she was told that she had mini strokes. She was on baby aspirin for years, she recently stopped taking it. I did encourage her to go back on it especially as she did well taking it, no side effects reported. She was also told in the past that she had low vitamin D and has been on an over-the-counter vitamin D supplement which she currently does not use a regular basis. She has not had her vitamin D checked in years as I understand.  Her Past Medical History Is Significant For: Past Medical History:  Diagnosis Date   Allergy    Anxiety    Arthritis    Asthma    GERD (gastroesophageal reflux disease)    Hyperlipidemia    Mini stroke    Morton's neuroma 10/2020   followed by podiatry   Smoking     Stroke ([place])    Mini CVA - 2012    Her Past Surgical History Is Significant For: Past Surgical History:  Procedure Laterality Date   BONE SPUR Bilateral    feet   NEURECTOMY FOOT Right 11/04/2020   Morton's neurectomy [facility])   PLANTAR FASCIA SURGERY Bilateral     Her Family History Is Significant For: Family History  Problem Relation Age of Onset   Arthritis Mother    Hyperlipidemia Mother    Hypertension Mother    Tremor Mother    Atrial fibrillation Mother    Cancer Mother        blood cancer   Headache Mother    Asthma Father    Kidney cancer Father    Kidney disease Father    Migraines Father    Crohn's disease Sister    Alcohol abuse Maternal Grandmother    Arthritis Maternal Grandmother    Heart disease Maternal Grandfather    Alcohol abuse Maternal Grandfather    Arthritis Paternal Grandmother    Asthma Paternal Grandmother    Headache Paternal Grandmother    Asthma Daughter    Tremor Daughter    Migraines Nephew    Colon cancer Neg Hx    Esophageal cancer Neg Hx    Rectal cancer Neg Hx    Stomach cancer Neg Hx     Her Social History Is Significant For: Social History   Socioeconomic History   Marital status: Married    Spouse name: Not on file   Number of children: 1   Years of education: Not on file   Highest education level: Not on file  Occupational History   Occupation: Copywriter, advertising: [employer]  Tobacco Use   Smoking status: Former    Packs/day: 0.25    Types: Cigarettes    Quit date: 08/02/2021    Years since quitting: 0.0   Smokeless tobacco: Never   Tobacco comments:    less then 1/2 a pack a day.  prev able to tolerate chantix  Vaping Use   Vaping Use: Never used  Substance and Sexual Activity   Alcohol use: Yes    Alcohol/week: 3.0 standard drinks of alcohol    Types: 3  Glasses of wine per week    Comment: social drinker   Drug use: No   Sexual activity: Not on file  Other Topics Concern   Not on file  Social History  Narrative   Not on file   Social Determinants of Health   Financial Resource Strain: Not on file  Food Insecurity: Not on file  Transportation Needs: Not on file  Physical Activity: Not on file  Stress: Not on file  Social Connections: Not on file    Her Allergies Are:  Allergies  Allergen Reactions   Latex Itching   Sulfa Antibiotics Rash  :   Her Current Medications Are:  Outpatient Encounter Medications as of 09/07/2021  Medication Sig   aspirin 81 MG EC tablet Take 1 tablet (81 mg total) by mouth daily. Swallow whole.   atorvastatin (LIPITOR) 40 MG tablet Take 1 tablet (40 mg total) by mouth daily.   butalbital-acetaminophen-caffeine (FIORICET) 50-325-40 MG tablet Take 1 tablet by mouth 2 (two) times daily as needed for migraine (sedation precautions).   Cholecalciferol (VITAMIN D3 PO) Take by mouth daily.   Cyanocobalamin (B-12 PO) Take by mouth daily.   esomeprazole (NEXIUM) 40 MG capsule Take 1 capsule (40 mg total) by mouth daily at 12 noon.   estradiol (ESTRACE) 0.1 MG/GM vaginal cream Place vaginally.   Flaxseed, Linseed, (FLAX SEEDS PO) Take by mouth.   lidocaine (XYLOCAINE) 5 % ointment Apply 1 application. topically as needed.   Multiple Vitamins-Minerals (CENTRUM PO) Take by mouth daily.   Nutritional Supplements (ESTROVEN PO) Take by mouth.   ondansetron (ZOFRAN) 4 MG tablet Take 1 tablet (4 mg total) by mouth every 8 (eight) hours as needed.   PARoxetine (PAXIL) 40 MG tablet Take 1 tablet (40 mg total) by mouth every morning.   primidone (MYSOLINE) 50 MG tablet Take 2 tablets (100 mg total) by mouth at bedtime.   propranolol ER (INDERAL LA) 160 MG SR capsule Take 1 capsule (160 mg total) by mouth daily.   Rhubarb (ESTROVEN MENOPAUSE RELIEF) 4 MG TABS Take by mouth.   topiramate (TOPAMAX) 50 MG tablet Take 1 tablet (50 mg total) by mouth 2 (two) times daily.   ibuprofen (ADVIL) 800 MG tablet TAKE 1 TABLET BY MOUTH THREE TIMES A DAY   methocarbamol (ROBAXIN) 500  MG tablet Take 1 tablet (500 mg total) by mouth 3 (three) times daily as needed for muscle spasms (sedation precautions).   Rimegepant Sulfate (NURTEC) 75 MG TBDP Take 1 tablet by mouth daily as needed (migraine).   varenicline (CHANTIX) 0.5 MG tablet Take 1 tablet (0.5 mg total) by mouth 2 (two) times daily.   Facility-Administered Encounter Medications as of 09/07/2021  Medication   famotidine (PEPCID) tablet 20 mg  :  Review of Systems:  Out of a complete 14 point review of systems, all are reviewed and negative with the exception of these symptoms as listed below:  Review of Systems  Neurological:        Pt here for headaches Pt states she gets daily headaches, some snoring . Pt denies fatigue , hypertension . Pt states she has family hx of migraines and headaches.Pt has hx of strokes     Objective:  Neurological Exam  Physical Exam Physical Examination:   Vitals:   09/07/21 0936  BP: 114/73  Pulse: (!) 59   General Examination: The patient is a very pleasant 56 y.o. female in no acute distress. She appears well-developed and well-nourished and well groomed.  HEENT: Normocephalic, atraumatic, pupils are equal, round and reactive to light and accommodation. Extraocular tracking is good without limitation to gaze excursion or nystagmus noted. Normal smooth pursuit is noted. Hearing is grossly intact. Face is symmetric with normal facial animation and normal facial sensation to light touch, temperature and vibration. Speech is clear with no dysarthria noted. There is no hypophonia. Is no obvious voice or lip tremor. She has a very slight intermittent head tremor. Neck is supple with full range of motion. She has no carotid bruits. Airway examination is benign. Tongue protrudes centrally and palate elevates symmetrically.   Chest: Clear to auscultation without wheezing, rhonchi or crackles noted.   Heart: S1+S2+0, regular and normal without murmurs, rubs or gallops noted.     Abdomen: Soft, non-tender and non-distended.   Extremities: There is no pitting edema in the distal lower extremities bilaterally.    Skin: Warm and dry without trophic changes noted. There are no varicose veins.   Musculoskeletal: exam reveals no obvious joint deformities.    Neurologically:  Mental status: The patient is awake, alert and oriented in all 4 spheres. Her immediate and remote memory, attention, language skills and fund of knowledge are appropriate. There is no evidence of aphasia, agnosia, apraxia or anomia. Speech is clear with normal prosody and enunciation. Thought process is linear. Mood is normal and affect is normal.  Cranial nerves II - XII are as described above under HEENT exam.  Motor exam: Normal bulk, strength and tone is noted. There is no drift, resting tremor or rebound. She has a minimal to mild bilateral upper extremity postural and mild action tremor.  No intention tremor.    (On 09/06/2017: Archimedes spiral drawing she has minimal trembling with both hands. Handwriting with her right hand is legible, not particularly tremulous, not micrographic.)   Romberg is negative. Reflexes are 1+ throughout.  Toes are downgoing bilaterally.  Fine motor skills and coordination: intact with normal finger taps, normal hand movements, normal rapid alternating patting, normal foot taps and normal foot agility.  Cerebellar testing: No dysmetria or intention tremor. No ataxia.  Normal finger-to-nose and normal heel-to-shin.   Sensory exam: intact to light touch, temperature and vibration sense in the upper and lower extremities.  Gait, station and balance: She stands easily. No veering to one side is noted. No leaning to one side is noted. Posture is age-appropriate and stance is narrow based.  Tandem walk is slightly challenging but doable.  Assessment and plan:  In summary, Samanvi Cuccia is a very pleasant 55 year old right-handed woman with an underlying medical history of  reflux disease, history of TIA, cerebellar stroke, hyperlipidemia, smoking, depression, anxiety, arthritis, allergies, and borderline overweight state, who presents for evaluation of her migraine headaches of several years duration.  She has trialed and failed multiple preventative and abortive medications.  She is currently on Topamax 50 mg once daily and is encouraged to increase it as per your prescription to 50 mg twice daily.  We can further increase it in about a month or 6 weeks to 100 mg twice daily gradually.  She is advised to keep her appointment in 3 months with Ward Givens, NP.  We talked about abortive treatment and triggers quite a bit today.  She is advised to reschedule her eye appointment which she had to cancel recently.  She has a nonfocal neurological exam and brain MRI without contrast from August 2022 was benign as well.  She has a history of incidental finding of a  lacunar stroke.  She has been on propranolol and Mysoline for tremor control with good success.  She is advised to be cautious with the use of Fioricet due to rebound headaches and habit formation.  She is advised to start Ubrelvy 100 mg strength as needed for abortive treatment of her migraines as Nurtec has not been helpful.  She can discontinue Nurtec once she is able to fill the [place].  We talked about once a month injections with the CGRP inhibitors.  She is not a candidate for Aimovig due to her latex allergy but could use Ajovy or Emgality.  We also talked about Botox injections.  She can discuss this further with the practitioner next time, if she would like to embark on Botox injections, we will do a formal consent next time during her visit with Ward Givens, NP.  She is advised to read up on these options online as well on the official medication websites and she was given some information material about Botox injections and Ajovy injections.  For now, we will titrate Topamax further up and I will prescribe  Ubrelvy as needed.  I answered all her questions today and she was in agreement.  Thank you very much for allowing me to participate in the care of this nice patient. If I can be of any further assistance to you please do not hesitate to call me at [phone].   Sincerely,     Star Age, MD, PhD  I spent 45 minutes in total face-to-face time and in reviewing records during pre-charting, more than 50% of which was spent in counseling and coordination of care, reviewing test results, reviewing medications and treatment regimen and/or in discussing or reviewing the diagnosis of migraine, the prognosis and treatment options. Pertinent laboratory and imaging test results that were available during this visit with the patient were reviewed by me and considered in my medical decision making (see chart for details).

## 2021-09-07 NOTE — Patient Instructions (Signed)
It was nice to see you again today.  Please remember, common headache triggers are: sleep deprivation, dehydration, overheating, stress, hypoglycemia or skipping meals and blood sugar fluctuations, excessive pain medications or excessive alcohol use or caffeine withdrawal. Some people have food triggers such as aged cheese, orange juice or chocolate, especially dark chocolate, or MSG (monosodium glutamate). Try to avoid these headache triggers as much possible. It may be helpful to keep a headache diary to figure out what makes your headaches worse or brings them on and what alleviates them. Some people report headache onset after exercise but studies have shown that regular exercise may actually prevent headaches from coming. If you have exercise-induced headaches, please make sure that you drink plenty of fluid before and after exercising and that you do not over do it and do not overheat.  For migraine prevention, I would like to suggest that you increase your Topamax to 50 mg twice daily for now.  In a month or 2 months from now we can consider increasing it to 100 mg twice daily eventually.  You can keep your appointment with [facility] in 3 months.  We will talk about the possibility of Botox injections at the time, please also read up on the monthly injectable medications such as Ajovy or Emgality.  Given your latex allergy you would not be a candidate for Aimovig injections.  Botox injections are every 90-day and are done in our office.  For migraine acute treatment, I would like for you to avoid taking Fioricet as it can be habit-forming and cause rebound headaches.  I would like for you to try Start Ubrelvy, 100 mg strength: Take 1 pill at onset of migraine headache, may repeat in 2 hours, no more than 2 pills in 24 hours. May cause sedation and nausea.  Once you are able to fill the prescription for Ubrelvy, please stop using Nurtec.

## 2021-09-19 ENCOUNTER — Other Ambulatory Visit: Payer: Self-pay | Admitting: Family Medicine

## 2021-09-19 NOTE — Telephone Encounter (Signed)
Refill request for butalbital-acetaminophen-caffeine (FIORICET) 50-325-40 MG tablet  LOV - 06/13/21 Next OV - not scheduled Last refill - 09/05/21 #30/0

## 2021-09-20 ENCOUNTER — Encounter: Payer: Self-pay | Admitting: Neurology

## 2021-09-20 NOTE — Telephone Encounter (Signed)
Please check in with patient, we can also try Zomig or Imitrex if she would rather try a different triptan.  We do not have to try Maxalt.  If she is interested in trying Zomig or sumatriptan rather than Maxalt, we can certainly put a prescription in for that.

## 2021-09-21 MED ORDER — BUTALBITAL-APAP-CAFFEINE 50-325-40 MG PO TABS
1.0000 | ORAL_TABLET | Freq: Two times a day (BID) | ORAL | 0 refills | Status: DC | PRN
Start: 2021-09-21 — End: 2021-10-17

## 2021-09-21 NOTE — Telephone Encounter (Signed)
ERx Plz check- how often is she taking fioricet?

## 2021-09-21 NOTE — Telephone Encounter (Signed)
Please offer CPE for next Wednesday am if pt able to come in.

## 2021-09-22 NOTE — Telephone Encounter (Signed)
Spoke with pt notifying her refill was sent in.  Expresses her thanks and states she only takes med PRN.  However, she c/o daily migraines.  Pt states she is "not happy" with current neurologist and requests referral to another office.

## 2021-09-22 NOTE — Telephone Encounter (Signed)
Spoke with pt offering CPE on 09/28/21, however, pt has other appts.  Scheduled CPE on 01/03/22.

## 2021-09-28 ENCOUNTER — Telehealth: Payer: Self-pay | Admitting: *Deleted

## 2021-09-28 MED ORDER — VARENICLINE TARTRATE 0.5 MG PO TABS: 0.5000 mg | ORAL_TABLET | Freq: Two times a day (BID) | ORAL | 0 refills | Status: DC

## 2021-09-28 NOTE — Addendum Note (Signed)
Addended by: Ria Bush on: 09/28/2021 01:48 PM   Modules accepted: Orders

## 2021-09-28 NOTE — Telephone Encounter (Signed)
Pt saw neurologist, continues to have migraines.  She would like to see [place] - I have placed a new referral. They required patients to first see neurologist.

## 2021-09-28 NOTE — Telephone Encounter (Signed)
Completed Roselyn Meier PA on Cover My Meds. Key: [id number]. Awaiting determination from [facility].

## 2021-09-28 NOTE — Addendum Note (Signed)
Addended by: Ria Bush on: 09/28/2021 08:29 AM   Modules accepted: Orders

## 2021-10-03 NOTE — Telephone Encounter (Signed)
Referral sent to [facility] as requested. Nothing further needed.    Kristie Miles  09/28/2299:41:45 PM  Referral sent and pt is aware via mychart message [facility]

## 2021-10-03 NOTE — Telephone Encounter (Signed)
Approved on September 27 Request Reference Number: [id number]. UBRELVY TAB '100MG'$  is approved through 12/28/2021. Your patient may now fill this prescription and it will be covered.  Approval letter faxed to pharmacy. Received a receipt of confirmation.

## 2021-10-10 ENCOUNTER — Ambulatory Visit: Payer: Managed Care, Other (non HMO) | Admitting: Family Medicine

## 2021-10-13 ENCOUNTER — Encounter: Payer: Self-pay | Admitting: Family Medicine

## 2021-10-17 ENCOUNTER — Other Ambulatory Visit: Payer: Self-pay | Admitting: Family Medicine

## 2021-10-18 MED ORDER — PAROXETINE HCL 40 MG PO TABS
40.0000 mg | ORAL_TABLET | ORAL | 2 refills | Status: DC
Start: 2021-10-18 — End: 2021-10-18

## 2021-10-18 MED ORDER — PAROXETINE HCL 40 MG PO TABS
40.0000 mg | ORAL_TABLET | ORAL | 2 refills | Status: DC
Start: 2021-10-18 — End: 2022-01-04

## 2021-10-18 NOTE — Telephone Encounter (Signed)
Name of Medication: Fioricet Name of Pharmacy: [facility] Date and Quantity: 09/21/21, #30 Last Office Visit and Type: 06/13/21, back pain Next Office Visit and Type: 01/03/22, CPE Last Controlled Substance Agreement Date: none Last UDS: none

## 2021-10-19 MED ORDER — BUTALBITAL-APAP-CAFFEINE 50-325-40 MG PO TABS
1.0000 | ORAL_TABLET | Freq: Two times a day (BID) | ORAL | 0 refills | Status: DC | PRN
Start: 2021-10-19 — End: 2021-12-20

## 2021-10-28 ENCOUNTER — Other Ambulatory Visit: Payer: Self-pay | Admitting: Family Medicine

## 2021-10-28 NOTE — Telephone Encounter (Signed)
Chantix should be only ~3 month treatment then stopped. Looks she's been using regularly for >4 months, on lower dose due to difficulty tolerating higher full dose.  Please call for update on how she's using chantix.

## 2021-10-28 NOTE — Telephone Encounter (Signed)
Message from pharmacy:  REQUEST FOR 90 DAYS PRESCRIPTION. 

## 2021-10-31 NOTE — Telephone Encounter (Signed)
Left message on vm per dpr relaying Dr. Synthia Innocent message.  Asked pt to call back or send MyChart message updating him on how pt is using Chantix.

## 2021-11-01 NOTE — Telephone Encounter (Signed)
Left message on vm per dpr relaying Dr. Synthia Innocent message.  Asked pt to call back or send MyChart message updating him on how pt is using Chantix.

## 2021-11-03 NOTE — Telephone Encounter (Signed)
Spoke with pt asking for update on Chantix.  Pt offers apology for not returning call.  But states she is taking Chantix as prescribed.  States she forgot the rx is on auto-refill with [facility] but she does not need any right now.  Also, pt is currently involved with smoking cessation program through her job.  And they will supply some patches as she progresses.

## 2021-11-03 NOTE — Telephone Encounter (Signed)
Denied refill.

## 2021-11-11 ENCOUNTER — Other Ambulatory Visit: Payer: Self-pay | Admitting: Family Medicine

## 2021-11-11 MED ORDER — TOPIRAMATE 50 MG PO TABS: ORAL_TABLET | ORAL | 3 refills | Status: DC

## 2021-11-11 NOTE — Telephone Encounter (Signed)
  Encourage patient to contact the pharmacy for refills or they can request refills through [facility]  Did the patient contact the pharmacy: No  LAST APPOINTMENT DATE: 06/13/2021  NEXT APPOINTMENT DATE: 01/03/2022  MEDICATION: topiramate (TOPAMAX) 50 MG tablet   Is the patient out of medication? No  If not, how much is left? 2 left  PHARMACY: [id number] - [place], [place] - [address]   Comment: She stated that its a new dosage. She is taking 2 at night.   Let patient know to contact pharmacy at the end of the day to make sure medication is ready.  Please notify patient to allow 48-72 hours to process

## 2021-11-11 NOTE — Telephone Encounter (Signed)
ERx - will see if future refills can come through neurology

## 2021-11-11 NOTE — Telephone Encounter (Signed)
See refill request  Patient is seeing neurology now. Changes have been made to the directions now two at night. Last office visit 06/13/21 Upcoming appointment 01/03/22 Last refill 08/05/21 #180/1

## 2021-12-06 ENCOUNTER — Ambulatory Visit: Payer: Managed Care, Other (non HMO) | Admitting: Adult Health

## 2021-12-07 ENCOUNTER — Ambulatory Visit: Payer: Managed Care, Other (non HMO) | Admitting: Adult Health

## 2021-12-18 ENCOUNTER — Other Ambulatory Visit: Payer: Self-pay | Admitting: Adult Health

## 2021-12-20 ENCOUNTER — Other Ambulatory Visit: Payer: Self-pay | Admitting: Family Medicine

## 2021-12-20 NOTE — Telephone Encounter (Signed)
Name of Medication: Fioricet Name of Pharmacy: [place] or Written Date and Quantity: 10/19/21, #30 Last Office Visit and Type: 06/13/21, upper back pain Next Office Visit and Type: 01/03/22, CPE Last Controlled Substance Agreement Date: none Last UDS: none

## 2021-12-21 MED ORDER — BUTALBITAL-APAP-CAFFEINE 50-325-40 MG PO TABS: 1.0000 | ORAL_TABLET | Freq: Two times a day (BID) | ORAL | 0 refills | Status: DC | PRN

## 2021-12-26 ENCOUNTER — Other Ambulatory Visit: Payer: Self-pay | Admitting: Family Medicine

## 2021-12-26 DIAGNOSIS — R7303 Prediabetes: Secondary | ICD-10-CM

## 2021-12-26 DIAGNOSIS — E559 Vitamin D deficiency, unspecified: Secondary | ICD-10-CM

## 2021-12-26 DIAGNOSIS — G4452 New daily persistent headache (NDPH): Secondary | ICD-10-CM

## 2021-12-26 DIAGNOSIS — E785 Hyperlipidemia, unspecified: Secondary | ICD-10-CM

## 2021-12-26 DIAGNOSIS — Z1159 Encounter for screening for other viral diseases: Secondary | ICD-10-CM

## 2021-12-28 ENCOUNTER — Other Ambulatory Visit: Payer: Managed Care, Other (non HMO)

## 2021-12-28 ENCOUNTER — Other Ambulatory Visit (INDEPENDENT_AMBULATORY_CARE_PROVIDER_SITE_OTHER): Payer: Managed Care, Other (non HMO)

## 2021-12-28 DIAGNOSIS — E559 Vitamin D deficiency, unspecified: Secondary | ICD-10-CM

## 2021-12-28 DIAGNOSIS — Z1159 Encounter for screening for other viral diseases: Secondary | ICD-10-CM

## 2021-12-28 DIAGNOSIS — E785 Hyperlipidemia, unspecified: Secondary | ICD-10-CM

## 2021-12-28 DIAGNOSIS — R7303 Prediabetes: Secondary | ICD-10-CM

## 2021-12-28 DIAGNOSIS — G4452 New daily persistent headache (NDPH): Secondary | ICD-10-CM

## 2021-12-28 NOTE — Addendum Note (Signed)
Addended by: Francella Solian on: 12/28/2021 09:45 AM   Modules accepted: Orders

## 2021-12-29 LAB — COMPREHENSIVE METABOLIC PANEL
ALT: 52 IU/L — ABNORMAL HIGH (ref 0–32)
AST: 27 IU/L (ref 0–40)
Albumin/Globulin Ratio: 2.5 — ABNORMAL HIGH (ref 1.2–2.2)
Albumin: 4.5 g/dL (ref 3.8–4.9)
Alkaline Phosphatase: 95 IU/L (ref 44–121)
BUN/Creatinine Ratio: 18 (ref 9–23)
BUN: 12 mg/dL (ref 6–24)
Bilirubin Total: 0.2 mg/dL (ref 0.0–1.2)
CO2: 19 mmol/L — ABNORMAL LOW (ref 20–29)
Calcium: 9.1 mg/dL (ref 8.7–10.2)
Chloride: 106 mmol/L (ref 96–106)
Creatinine, Ser: 0.67 mg/dL (ref 0.57–1.00)
Globulin, Total: 1.8 g/dL (ref 1.5–4.5)
Glucose: 130 mg/dL — ABNORMAL HIGH (ref 70–99)
Potassium: 4.5 mmol/L (ref 3.5–5.2)
Sodium: 142 mmol/L (ref 134–144)
Total Protein: 6.3 g/dL (ref 6.0–8.5)
eGFR: 102 mL/min/{1.73_m2} (ref >59)

## 2021-12-29 LAB — CBC WITH DIFFERENTIAL/PLATELET
Basophils Absolute: 0 10*3/uL (ref 0.0–0.2)
Basos: 1 %
EOS (ABSOLUTE): 0.2 10*3/uL (ref 0.0–0.4)
Eos: 5 %
Hematocrit: 37.8 % (ref 34.0–46.6)
Hemoglobin: 13 g/dL (ref 11.1–15.9)
Immature Grans (Abs): 0 10*3/uL (ref 0.0–0.1)
Immature Granulocytes: 0 %
Lymphocytes Absolute: 1.8 10*3/uL (ref 0.7–3.1)
Lymphs: 37 %
MCH: 33.3 pg — ABNORMAL HIGH (ref 26.6–33.0)
MCHC: 34.4 g/dL (ref 31.5–35.7)
MCV: 97 fL (ref 79–97)
Monocytes Absolute: 0.4 10*3/uL (ref 0.1–0.9)
Monocytes: 7 %
Neutrophils Absolute: 2.5 10*3/uL (ref 1.4–7.0)
Neutrophils: 50 %
Platelets: 252 10*3/uL (ref 150–450)
RBC: 3.9 x10E6/uL (ref 3.77–5.28)
RDW: 12.8 % (ref 11.7–15.4)
WBC: 5 10*3/uL (ref 3.4–10.8)

## 2021-12-29 LAB — HEPATITIS C ANTIBODY: Hep C Virus Ab: NONREACTIVE

## 2021-12-29 LAB — VITAMIN D 25 HYDROXY (VIT D DEFICIENCY, FRACTURES): Vit D, 25-Hydroxy: 25 ng/mL — ABNORMAL LOW (ref 30.0–100.0)

## 2021-12-29 LAB — LIPID PANEL
Chol/HDL Ratio: 3.8 ratio (ref 0.0–4.4)
Cholesterol, Total: 180 mg/dL (ref 100–199)
HDL: 47 mg/dL (ref >39)
LDL Chol Calc (NIH): 84 mg/dL (ref 0–99)
Triglycerides: 300 mg/dL — ABNORMAL HIGH (ref 0–149)
VLDL Cholesterol Cal: 49 mg/dL — ABNORMAL HIGH (ref 5–40)

## 2021-12-29 LAB — TSH: TSH: 1.1 u[IU]/mL (ref 0.450–4.500)

## 2021-12-29 LAB — HEMOGLOBIN A1C
Est. average glucose Bld gHb Est-mCnc: 123 mg/dL
Hgb A1c MFr Bld: 5.9 % — ABNORMAL HIGH (ref 4.8–5.6)

## 2022-01-03 ENCOUNTER — Encounter: Payer: Self-pay | Admitting: Family Medicine

## 2022-01-03 ENCOUNTER — Other Ambulatory Visit: Payer: Self-pay | Admitting: Family Medicine

## 2022-01-03 ENCOUNTER — Ambulatory Visit (INDEPENDENT_AMBULATORY_CARE_PROVIDER_SITE_OTHER): Payer: Managed Care, Other (non HMO) | Admitting: Family Medicine

## 2022-01-03 VITALS — BP 118/78 | HR 58 | Temp 97.2°F | Ht 63.75 in | Wt 144.0 lb

## 2022-01-03 DIAGNOSIS — F17200 Nicotine dependence, unspecified, uncomplicated: Secondary | ICD-10-CM | POA: Diagnosis not present

## 2022-01-03 DIAGNOSIS — E559 Vitamin D deficiency, unspecified: Secondary | ICD-10-CM

## 2022-01-03 DIAGNOSIS — Z0000 Encounter for general adult medical examination without abnormal findings: Secondary | ICD-10-CM

## 2022-01-03 DIAGNOSIS — E785 Hyperlipidemia, unspecified: Secondary | ICD-10-CM

## 2022-01-03 DIAGNOSIS — Z23 Encounter for immunization: Secondary | ICD-10-CM | POA: Diagnosis not present

## 2022-01-03 DIAGNOSIS — K219 Gastro-esophageal reflux disease without esophagitis: Secondary | ICD-10-CM

## 2022-01-03 DIAGNOSIS — Z8673 Personal history of transient ischemic attack (TIA), and cerebral infarction without residual deficits: Secondary | ICD-10-CM

## 2022-01-03 DIAGNOSIS — F39 Unspecified mood [affective] disorder: Secondary | ICD-10-CM

## 2022-01-03 DIAGNOSIS — G43009 Migraine without aura, not intractable, without status migrainosus: Secondary | ICD-10-CM

## 2022-01-03 DIAGNOSIS — R7303 Prediabetes: Secondary | ICD-10-CM

## 2022-01-03 DIAGNOSIS — G250 Essential tremor: Secondary | ICD-10-CM

## 2022-01-03 MED ORDER — VARENICLINE TARTRATE 0.5 MG PO TABS: 0.5000 mg | ORAL_TABLET | Freq: Two times a day (BID) | ORAL | 1 refills | Status: DC

## 2022-01-03 MED ORDER — ONDANSETRON HCL 4 MG PO TABS: 4.0000 mg | ORAL_TABLET | Freq: Three times a day (TID) | ORAL | 3 refills | Status: AC | PRN

## 2022-01-03 NOTE — Patient Instructions (Addendum)
Flu shot today We will refer you to lung cancer screening program.  Consider shingrix vaccine series.  Consider tetanus shot.  Increase vitamin D to 2000 units daily.  You are doing well today. Return in 1 year for next physical.  Health Maintenance for Postmenopausal Women Menopause is a normal process in which your ability to get pregnant comes to an end. This process happens slowly over many months or years, usually between the ages of 67 and 66. Menopause is complete when you have missed your menstrual period for 12 months. It is important to talk with your health care provider about some of the most common conditions that affect women after menopause (postmenopausal women). These include heart disease, cancer, and bone loss (osteoporosis). Adopting a healthy lifestyle and getting preventive care can help to promote your health and wellness. The actions you take can also lower your chances of developing some of these common conditions. What are the signs and symptoms of menopause? During menopause, you may have the following symptoms: Hot flashes. These can be moderate or severe. Night sweats. Decrease in sex drive. Mood swings. Headaches. Tiredness (fatigue). Irritability. Memory problems. Problems falling asleep or staying asleep. Talk with your health care provider about treatment options for your symptoms. Do I need hormone replacement therapy? Hormone replacement therapy is effective in treating symptoms that are caused by menopause, such as hot flashes and night sweats. Hormone replacement carries certain risks, especially as you become older. If you are thinking about using estrogen or estrogen with progestin, discuss the benefits and risks with your health care provider. How can I reduce my risk for heart disease and stroke? The risk of heart disease, heart attack, and stroke increases as you age. One of the causes may be a change in the body's hormones during menopause. This  can affect how your body uses dietary fats, triglycerides, and cholesterol. Heart attack and stroke are medical emergencies. There are many things that you can do to help prevent heart disease and stroke. Watch your blood pressure High blood pressure causes heart disease and increases the risk of stroke. This is more likely to develop in people who have high blood pressure readings or are overweight. Have your blood pressure checked: Every 3-5 years if you are 60-43 years of age. Every year if you are 97 years old or older. Eat a healthy diet  Eat a diet that includes plenty of vegetables, fruits, low-fat dairy products, and lean protein. Do not eat a lot of foods that are high in solid fats, added sugars, or sodium. Get regular exercise Get regular exercise. This is one of the most important things you can do for your health. Most adults should: Try to exercise for at least 150 minutes each week. The exercise should increase your heart rate and make you sweat (moderate-intensity exercise). Try to do strengthening exercises at least twice each week. Do these in addition to the moderate-intensity exercise. Spend less time sitting. Even light physical activity can be beneficial. Other tips Work with your health care provider to achieve or maintain a healthy weight. Do not use any products that contain nicotine or tobacco. These products include cigarettes, chewing tobacco, and vaping devices, such as e-cigarettes. If you need help quitting, ask your health care provider. Know your numbers. Ask your health care provider to check your cholesterol and your blood sugar (glucose). Continue to have your blood tested as directed by your health care provider. Do I need screening for cancer? Depending on  on your health history and family history, you may need to have cancer screenings at different stages of your life. This may include screening for: Breast cancer. Cervical cancer. Lung cancer. Colorectal  cancer. What is my risk for osteoporosis? After menopause, you may be at increased risk for osteoporosis. Osteoporosis is a condition in which bone destruction happens more quickly than new bone creation. To help prevent osteoporosis or the bone fractures that can happen because of osteoporosis, you may take the following actions: If you are 19-50 years old, get at least 1,000 mg of calcium and at least 600 international units (IU) of vitamin D per day. If you are older than age 50 but younger than age 70, get at least 1,200 mg of calcium and at least 600 international units (IU) of vitamin D per day. If you are older than age 70, get at least 1,200 mg of calcium and at least 800 international units (IU) of vitamin D per day. Smoking and drinking excessive alcohol increase the risk of osteoporosis. Eat foods that are rich in calcium and vitamin D, and do weight-bearing exercises several times each week as directed by your health care provider. How does menopause affect my mental health? Depression may occur at any age, but it is more common as you become older. Common symptoms of depression include: Feeling depressed. Changes in sleep patterns. Changes in appetite or eating patterns. Feeling an overall lack of motivation or enjoyment of activities that you previously enjoyed. Frequent crying spells. Talk with your health care provider if you think that you are experiencing any of these symptoms. General instructions See your health care provider for regular wellness exams and vaccines. This may include: Scheduling regular health, dental, and eye exams. Getting and maintaining your vaccines. These include: Influenza vaccine. Get this vaccine each year before the flu season begins. Pneumonia vaccine. Shingles vaccine. Tetanus, diphtheria, and pertussis (Tdap) booster vaccine. Your health care provider may also recommend other immunizations. Tell your health care provider if you have ever been  abused or do not feel safe at home. Summary Menopause is a normal process in which your ability to get pregnant comes to an end. This condition causes hot flashes, night sweats, decreased interest in sex, mood swings, headaches, or lack of sleep. Treatment for this condition may include hormone replacement therapy. Take actions to keep yourself healthy, including exercising regularly, eating a healthy diet, watching your weight, and checking your blood pressure and blood sugar levels. Get screened for cancer and depression. Make sure that you are up to date with all your vaccines. This information is not intended to replace advice given to you by your health care provider. Make sure you discuss any questions you have with your health care provider. Document Revised: 05/10/2020 Document Reviewed: 05/10/2020 Elsevier Patient Education  2023 Elsevier Inc.  

## 2022-01-03 NOTE — Progress Notes (Unsigned)
Patient ID: Kristie Miles, female    DOB: 12-09-1965, 57 y.o.   MRN: [id number]  This visit was conducted in person.  BP 118/78 (BP Location: Left Arm, Patient Position: Sitting)   Pulse (!) 58   Temp (!) 97.2 F (36.2 C) (Skin)   Ht 5' 3.75" (1.619 m)   Wt 144 lb (65.3 kg)   SpO2 97%   BMI 24.91 kg/m    CC: CPE Subjective:   HPI: Kristie Miles is a 57 y.o. female presenting on 01/03/2022 for Annual Exam   Last wellness exam 01/2019.   Chronic headaches/migraines - sees neurology Dr Rexene Alberts on topamax 118m nightly. No longer on ubrelvy, nurtec (never worked). Abortive treatment - ibuprofen 8061m rarely fioricet.   S/p 4 foot surgeries.   EGD 05/2021 - 2cm HH, gastritis - reflux changes on biopsy.   Preventative: Colonoscopy 05/2021 - 2 HP, rpt 10 yrs (Nandigam)  Well woman exam - sees [facility] Dr MoBenjie Karvonenearly pending 02/2022 with pap. LMP 05/2015 Mammogram through GYN  DEXA scan -  Lung cancer screening - 20 PY hx, interested, will refer Flu shot - yearly COAldan/2021, 04/2019, booster 12/2019 Tetanus shot - due.  Pneumonia shot -  Shingrix - discussed. interested Advanced directive discussion -  Seat belt use discussed Sunscreen use discussed. Sees derm yearly.  Smoking - quit smoking for 6 months with chantix, has since restarted 2 wks ago, now back to 1/2 ppd. Requests restarting chantix.  Alcohol - socially  Dentist - q6 mo Eye exam - yearly  Occ: works from home  Diet: [place]its and veggies. Avoids fried food. Drinks water, green tea, and milk. Some wine, 4 total per week. Exercise: No formal exercise right now.      Relevant past medical, surgical, family and social history reviewed and updated as indicated. Interim medical history since our last visit reviewed. Allergies and medications reviewed and updated. Outpatient Medications Prior to Visit  Medication Sig Dispense Refill   aspirin 81 MG EC tablet Take 1 tablet (81 mg total) by  mouth daily. Swallow whole.     atorvastatin (LIPITOR) 40 MG tablet Take 1 tablet (40 mg total) by mouth daily. 90 tablet 3   butalbital-acetaminophen-caffeine (FIORICET) 50-325-40 MG tablet Take 1 tablet by mouth 2 (two) times daily as needed for migraine (sedation precautions). 30 tablet 0   Cyanocobalamin (B-12 PO) Take by mouth daily.     esomeprazole (NEXIUM) 40 MG capsule Take 1 capsule (40 mg total) by mouth daily at 12 noon. 90 capsule 3   estradiol (ESTRACE) 0.1 MG/GM vaginal cream Place vaginally.     Flaxseed, Linseed, (FLAX SEEDS PO) Take by mouth.     ibuprofen (ADVIL) 800 MG tablet TAKE 1 TABLET BY MOUTH THREE TIMES A DAY 90 tablet 1   lidocaine (XYLOCAINE) 5 % ointment Apply 1 application. topically as needed.     Multiple Vitamins-Minerals (CENTRUM PO) Take by mouth daily.     Nutritional Supplements (ESTROVEN PO) Take by mouth.     primidone (MYSOLINE) 50 MG tablet Take 2 tablets (100 mg total) by mouth at bedtime. 180 tablet 3   propranolol ER (INDERAL LA) 160 MG SR capsule TAKE 1 CAPSULE BY MOUTH DAILY 90 capsule 3   Rhubarb (ESTROVEN MENOPAUSE RELIEF) 4 MG TABS Take by mouth.     topiramate (TOPAMAX) 50 MG tablet Take 1 tablet (50 mg total) by mouth daily AND 2 tablets (100 mg total) at  bedtime. 90 tablet 3   Cholecalciferol (VITAMIN D3 PO) Take by mouth daily.     ondansetron (ZOFRAN) 4 MG tablet Take 1 tablet (4 mg total) by mouth every 8 (eight) hours as needed. 20 tablet 3   PARoxetine (PAXIL) 40 MG tablet Take 1 tablet (40 mg total) by mouth every morning. 90 tablet 2   methocarbamol (ROBAXIN) 500 MG tablet Take 1 tablet (500 mg total) by mouth 3 (three) times daily as needed for muscle spasms (sedation precautions). 40 tablet 0   Rimegepant Sulfate (NURTEC) 75 MG TBDP Take 1 tablet by mouth daily as needed (migraine). 15 tablet 3   Ubrogepant (UBRELVY) 100 MG TABS Take 100 mg by mouth as needed. May repeat in 2 hours if needed, no more than 2 pills in 24 hours. 10 tablet  3   varenicline (CHANTIX) 0.5 MG tablet Take 1 tablet (0.5 mg total) by mouth 2 (two) times daily. (Patient not taking: Reported on 01/03/2022) 60 tablet 0   Facility-Administered Medications Prior to Visit  Medication Dose Route Frequency Provider Last Rate Last Admin   famotidine (PEPCID) tablet 20 mg  20 mg Oral Daily Nandigam, Kavitha V, MD         Per HPI unless specifically indicated in ROS section below Review of Systems  Constitutional:  Negative for activity change, appetite change, chills, fatigue, fever and unexpected weight change.  HENT:  Negative for hearing loss.   Eyes:  Negative for visual disturbance.  Respiratory:  Negative for cough, chest tightness, shortness of breath and wheezing.   Cardiovascular:  Negative for chest pain, palpitations and leg swelling.  Gastrointestinal:  Negative for abdominal distention, abdominal pain, blood in stool, constipation, diarrhea, nausea and vomiting.  Genitourinary:  Negative for difficulty urinating and hematuria.  Musculoskeletal:  Negative for arthralgias, myalgias and neck pain.  Skin:  Negative for rash.  Neurological:  Negative for dizziness, seizures, syncope and headaches.  Hematological:  Negative for adenopathy. Does not bruise/bleed easily.  Psychiatric/Behavioral:  Negative for dysphoric mood. The patient is not nervous/anxious.     Objective:  BP 118/78 (BP Location: Left Arm, Patient Position: Sitting)   Pulse (!) 58   Temp (!) 97.2 F (36.2 C) (Skin)   Ht 5' 3.75" (1.619 m)   Wt 144 lb (65.3 kg)   SpO2 97%   BMI 24.91 kg/m   Wt Readings from Last 3 Encounters:  01/03/22 144 lb (65.3 kg)  09/07/21 140 lb (63.5 kg)  06/13/21 143 lb (64.9 kg)      Physical Exam Vitals and nursing note reviewed.  Constitutional:      Appearance: Normal appearance. She is not ill-appearing.  HENT:     Head: Normocephalic and atraumatic.     Right Ear: Tympanic membrane, ear canal and external ear normal. There is no  impacted cerumen.     Left Ear: Tympanic membrane, ear canal and external ear normal. There is no impacted cerumen.  Eyes:     General:        Right eye: No discharge.        Left eye: No discharge.     Extraocular Movements: Extraocular movements intact.     Conjunctiva/sclera: Conjunctivae normal.     Pupils: Pupils are equal, round, and reactive to light.  Neck:     Thyroid: No thyroid mass or thyromegaly.  Cardiovascular:     Rate and Rhythm: Normal rate and regular rhythm.     Pulses: Normal pulses.  Heart sounds: Normal heart sounds. No murmur heard. Pulmonary:     Effort: Pulmonary effort is normal. No respiratory distress.     Breath sounds: Normal breath sounds. No wheezing, rhonchi or rales.  Abdominal:     General: Bowel sounds are normal. There is no distension.     Palpations: Abdomen is soft. There is no mass.     Tenderness: There is no abdominal tenderness. There is no guarding or rebound.     Hernia: No hernia is present.  Musculoskeletal:     Cervical back: Normal range of motion and neck supple. No rigidity.     Right lower leg: No edema.     Left lower leg: No edema.  Lymphadenopathy:     Cervical: No cervical adenopathy.  Skin:    General: Skin is warm and dry.     Findings: No rash.  Neurological:     General: No focal deficit present.     Mental Status: She is alert. Mental status is at baseline.  Psychiatric:        Mood and Affect: Mood normal.        Behavior: Behavior normal.       Results for orders placed or performed in visit on 12/28/21  CBC with Differential/Platelet  Result Value Ref Range   WBC 5.0 3.4 - 10.8 x10E3/uL   RBC 3.90 3.77 - 5.28 x10E6/uL   Hemoglobin 13.0 11.1 - 15.9 g/dL   Hematocrit 37.8 34.0 - 46.6 %   MCV 97 79 - 97 fL   MCH 33.3 (H) 26.6 - 33.0 pg   MCHC 34.4 31.5 - 35.7 g/dL   RDW 12.8 11.7 - 15.4 %   Platelets 252 150 - 450 x10E3/uL   Neutrophils 50 Not Estab. %   Lymphs 37 Not Estab. %   Monocytes 7 Not  Estab. %   Eos 5 Not Estab. %   Basos 1 Not Estab. %   Neutrophils Absolute 2.5 1.4 - 7.0 x10E3/uL   Lymphocytes Absolute 1.8 0.7 - 3.1 x10E3/uL   Monocytes Absolute 0.4 0.1 - 0.9 x10E3/uL   EOS (ABSOLUTE) 0.2 0.0 - 0.4 x10E3/uL   Basophils Absolute 0.0 0.0 - 0.2 x10E3/uL   Immature Granulocytes 0 Not Estab. %   Immature Grans (Abs) 0.0 0.0 - 0.1 x10E3/uL  Hemoglobin A1c  Result Value Ref Range   Hgb A1c MFr Bld 5.9 (H) 4.8 - 5.6 %   Est. average glucose Bld gHb Est-mCnc 123 mg/dL  Comprehensive metabolic panel  Result Value Ref Range   Glucose 130 (H) 70 - 99 mg/dL   BUN 12 6 - 24 mg/dL   Creatinine, Ser 0.67 0.57 - 1.00 mg/dL   eGFR 102 >59 mL/min/1.73   BUN/Creatinine Ratio 18 9 - 23   Sodium 142 134 - 144 mmol/L   Potassium 4.5 3.5 - 5.2 mmol/L   Chloride 106 96 - 106 mmol/L   CO2 19 (L) 20 - 29 mmol/L   Calcium 9.1 8.7 - 10.2 mg/dL   Total Protein 6.3 6.0 - 8.5 g/dL   Albumin 4.5 3.8 - 4.9 g/dL   Globulin, Total 1.8 1.5 - 4.5 g/dL   Albumin/Globulin Ratio 2.5 (H) 1.2 - 2.2   Bilirubin Total 0.2 0.0 - 1.2 mg/dL   Alkaline Phosphatase 95 44 - 121 IU/L   AST 27 0 - 40 IU/L   ALT 52 (H) 0 - 32 IU/L  Lipid Panel  Result Value Ref Range   Cholesterol, Total 180 100 -  199 mg/dL   Triglycerides 300 (H) 0 - 149 mg/dL   HDL 47 >39 mg/dL   VLDL Cholesterol Cal 49 (H) 5 - 40 mg/dL   LDL Chol Calc (NIH) 84 0 - 99 mg/dL   Chol/HDL Ratio 3.8 0.0 - 4.4 ratio  Vitamin D, 25-hydroxy  Result Value Ref Range   Vit D, 25-Hydroxy 25.0 (L) 30.0 - 100.0 ng/mL  TSH  Result Value Ref Range   TSH 1.100 0.450 - 4.500 uIU/mL  Hepatitis C Antibody  Result Value Ref Range   Hep C Virus Ab Non Reactive Non Reactive      01/03/2022    3:07 PM 02/23/2020    8:56 AM 01/21/2019   12:25 PM 12/16/2018   12:36 PM 12/10/2017   11:25 AM  Depression screen PHQ 2/9  Decreased Interest 0 0 0 0 0  Down, Depressed, Hopeless 0 0 0 0 0  PHQ - 2 Score 0 0 0 0 0   Assessment & Plan:   Problem List  Items Addressed This Visit     Health maintenance examination - Primary (Chronic)    Preventative protocols reviewed and updated unless pt declined. Discussed healthy diet and lifestyle.       History of CVA (cerebrovascular accident)    Continue aspirin, statin.  Avoid triptans.       GERD (gastroesophageal reflux disease)    Chronic, stable period on nexium with PRN pepcid.       Relevant Medications   ondansetron (ZOFRAN) 4 MG tablet   Smoker    Discussed. Has restarted smoking 1/2 ppd. Will restart chantix 0.74m daily to help her quit again (low dose). Discussed nicoderm patch use after completes chantix dose.  Will also refer to lung cancer screening program.       Relevant Orders   Ambulatory Referral for Lung Cancer Scre   Dyslipidemia    Chronic elevated triglycerides off fibrate only on atorvastatin. Reviewed diet choices to improve cholesterol control.  The 10-year ASCVD risk score (Arnett DK, et al., 2019) is: 4.9%   Values used to calculate the score:     Age: [age]years     Sex: Female     Is Non-Hispanic African American: No     Diabetic: No     Tobacco smoker: Yes     Systolic Blood Pressure: 1945mmHg     Is BP treated: No     HDL Cholesterol: 47 mg/dL     Total Cholesterol: 180 mg/dL       Mood disorder (HCC)    On paxil stared by GYN, but she requested I continue refill.       Essential tremor    Continues primidone and propranolol through neurology      Migraine    Significant improvement on topamax 10105mnightly, followed by neurology. Using ibuprofen and fioricet abortively      Relevant Medications   PARoxetine (PAXIL) 40 MG tablet   Prediabetes    Encouraged limiting added sugar in diet.       Vitamin D deficiency    Level low on daily replacement - increase to 2000 IU daily.       Other Visit Diagnoses     Need for influenza vaccination       Relevant Orders   Flu Vaccine QUAD 6+ mos PF IM (Fluarix Quad PF) (Completed)         Meds ordered this encounter  Medications   ondansetron (ZOFRAN) 4 MG tablet  Sig: Take 1 tablet (4 mg total) by mouth every 8 (eight) hours as needed.    Dispense:  20 tablet    Refill:  3   varenicline (CHANTIX) 0.5 MG tablet    Sig: Take 1 tablet (0.5 mg total) by mouth 2 (two) times daily.    Dispense:  60 tablet    Refill:  1   Cholecalciferol (VITAMIN D) 50 MCG (2000 UT) CAPS    Sig: Take 1 capsule (2,000 Units total) by mouth daily.    Dispense:  30 capsule   PARoxetine (PAXIL) 40 MG tablet    Sig: Take 1 tablet (40 mg total) by mouth every morning.    Dispense:  90 tablet    Refill:  3   Orders Placed This Encounter  Procedures   Flu Vaccine QUAD 6+ mos PF IM (Fluarix Quad PF)   Ambulatory Referral for Lung Cancer Scre    Referral Priority:   Routine    Referral Type:   Consultation    Referral Reason:   Specialty Services Required    Number of Visits Requested:   1     Patient instructions: Flu shot today We will refer you to lung cancer screening program.  Consider shingrix vaccine series.  Consider tetanus shot.  Increase vitamin D to 2000 units daily.  You are doing well today. Return in 1 year for next physical.  Follow up plan: Return in about 1 year (around 01/04/2023) for annual exam, prior fasting for blood work.  Ria Bush, MD

## 2022-01-03 NOTE — Assessment & Plan Note (Signed)
Discussed. Has restarted smoking 1/2 ppd. Will restart chantix 0.'5mg'$  daily to help her quit again (low dose). Discussed nicoderm patch use after completes chantix dose.  Will also refer to lung cancer screening program.

## 2022-01-04 DIAGNOSIS — Z0000 Encounter for general adult medical examination without abnormal findings: Secondary | ICD-10-CM | POA: Insufficient documentation

## 2022-01-04 MED ORDER — PAROXETINE HCL 40 MG PO TABS: 40.0000 mg | ORAL_TABLET | ORAL | 3 refills | Status: DC

## 2022-01-04 MED ORDER — VITAMIN D 50 MCG (2000 UT) PO CAPS: 1.0000 | ORAL_CAPSULE | Freq: Every day | ORAL | Status: DC

## 2022-01-04 NOTE — Assessment & Plan Note (Signed)
Encouraged limiting added sugar in diet.  

## 2022-01-04 NOTE — Assessment & Plan Note (Signed)
Continues primidone and propranolol through neurology

## 2022-01-04 NOTE — Assessment & Plan Note (Addendum)
Chronic elevated triglycerides off fibrate only on atorvastatin. Reviewed diet choices to improve cholesterol control.  The 10-year ASCVD risk score (Arnett DK, et al., 2019) is: 4.9%   Values used to calculate the score:     Age: 57 years     Sex: Female     Is Non-Hispanic African American: No     Diabetic: No     Tobacco smoker: Yes     Systolic Blood Pressure: 570 mmHg     Is BP treated: No     HDL Cholesterol: 47 mg/dL     Total Cholesterol: 180 mg/dL

## 2022-01-04 NOTE — Assessment & Plan Note (Signed)
Chronic, stable period on nexium with PRN pepcid.

## 2022-01-04 NOTE — Assessment & Plan Note (Signed)
Preventative protocols reviewed and updated unless pt declined. Discussed healthy diet and lifestyle.  

## 2022-01-04 NOTE — Assessment & Plan Note (Addendum)
Continue aspirin, statin.  Avoid triptans.

## 2022-01-04 NOTE — Assessment & Plan Note (Addendum)
On paxil stared by GYN, but she requested I continue refill.

## 2022-01-04 NOTE — Assessment & Plan Note (Addendum)
Significant improvement on topamax '100mg'$  nightly, followed by neurology. Using ibuprofen and fioricet abortively

## 2022-01-04 NOTE — Assessment & Plan Note (Signed)
Level low on daily replacement - increase to 2000 IU daily.

## 2022-01-05 ENCOUNTER — Other Ambulatory Visit: Payer: Self-pay | Admitting: Adult Health

## 2022-01-06 NOTE — Telephone Encounter (Signed)
Message from pt via pharmacy:  **Patient requests 90 days supply**

## 2022-01-12 ENCOUNTER — Encounter: Payer: Self-pay | Admitting: Family Medicine

## 2022-01-12 DIAGNOSIS — G250 Essential tremor: Secondary | ICD-10-CM

## 2022-01-12 DIAGNOSIS — G43009 Migraine without aura, not intractable, without status migrainosus: Secondary | ICD-10-CM

## 2022-01-18 ENCOUNTER — Telehealth: Payer: Self-pay

## 2022-01-18 NOTE — Telephone Encounter (Signed)
Was seen by our clinic from a chronic migraine for years, also seen by [facility] in 2022,  Already had MRI of the brain showed no significant abnormality,  Please let her see Meghan again, change supervising physician to one of the headache physicians,

## 2022-01-18 NOTE — Telephone Encounter (Signed)
We received a new referral for a patient of Dr Tori Milks for tremors and migraines. She is requesting a provider switch to Dr Krista Blue. Please advise

## 2022-01-18 NOTE — Telephone Encounter (Signed)
Fine with me

## 2022-01-25 NOTE — Telephone Encounter (Signed)
In this case, my recommendation would be that patient talk to her primary care to refer her to another practice as she does not wish to see her existing providers and a provider switch has been declined by 2 other physicians in our office.

## 2022-01-25 NOTE — Telephone Encounter (Signed)
Kristie Miles, I'm not quite sure how to proceed with this request since one of the diagnosis of the referral is tremors and I'm not sure either of our headache physicians are comfortable seeing for that.   Please advise

## 2022-01-25 NOTE — Telephone Encounter (Signed)
Just let the patient know I am happy to see her. I can consult with Dr. Krista Blue if needed.

## 2022-01-26 NOTE — Telephone Encounter (Signed)
Agree outside refer

## 2022-01-27 ENCOUNTER — Ambulatory Visit: Payer: Managed Care, Other (non HMO) | Admitting: Podiatry

## 2022-01-27 DIAGNOSIS — L600 Ingrowing nail: Secondary | ICD-10-CM | POA: Diagnosis not present

## 2022-01-27 DIAGNOSIS — R52 Pain, unspecified: Secondary | ICD-10-CM | POA: Diagnosis not present

## 2022-01-27 NOTE — Progress Notes (Signed)
   Chief Complaint  Patient presents with   Ingrown Toenail    Right hallux ingrown toenail, started 3 weeks ago, redness and some swelling, TX: soaking     Subjective: Patient presents today for evaluation of pain to the medial border of the left great toe. Patient is concerned for possible ingrown nail.  It is very sensitive to touch.  Patient presents today for further treatment and evaluation.  Past Medical History:  Diagnosis Date   Allergy    Anxiety    Arthritis    Asthma    GERD (gastroesophageal reflux disease)    Hyperlipidemia    Mini stroke    Morton's neuroma 10/2020   followed by podiatry   Smoking    Stroke ([place])    Mini CVA - 2012    Objective:  General: Well developed, nourished, in no acute distress, alert and oriented x3   Dermatology: Skin is warm, dry and supple bilateral.  Medial border left great toe is tender with evidence of an ingrowing nail. Pain on palpation noted to the border of the nail fold. The remaining nails appear unremarkable at this time. There are no open sores, lesions.  Vascular: DP and PT pulses palpable.  No clinical evidence of vascular compromise  Neruologic: Grossly intact via light touch bilateral.  Musculoskeletal: No pedal deformity noted  Assesement: #1 Paronychia with ingrowing nail medial border left great toe  Plan of Care:  1. Patient evaluated.  2. Discussed treatment alternatives and plan of care. Explained nail avulsion procedure and post procedure course to patient. 3. Patient opted for permanent partial nail avulsion of the ingrown portion of the nail.  4. Prior to procedure, local anesthesia infiltration utilized using 3 ml of a 50:50 mixture of 2% plain lidocaine and 0.5% plain marcaine in a normal hallux block fashion and a betadine prep performed.  5. Partial permanent nail avulsion with chemical matrixectomy performed using [id number] applications of phenol followed by alcohol flush.  6. Light dressing applied.   Post care instructions provided 7.  Return to clinic 2 weeks.  Edrick Kins, DPM [facility]  Dr. Edrick Kins, DPM    [address], [place] [address]                Office [phone]  Fax [phone]

## 2022-02-08 ENCOUNTER — Ambulatory Visit: Payer: Managed Care, Other (non HMO) | Admitting: Family Medicine

## 2022-02-13 ENCOUNTER — Ambulatory Visit: Payer: Managed Care, Other (non HMO) | Admitting: Adult Health

## 2022-02-13 ENCOUNTER — Encounter: Payer: Self-pay | Admitting: Adult Health

## 2022-02-13 VITALS — BP 124/86 | HR 62 | Ht 64.0 in | Wt 142.4 lb

## 2022-02-13 DIAGNOSIS — G43009 Migraine without aura, not intractable, without status migrainosus: Secondary | ICD-10-CM

## 2022-02-13 DIAGNOSIS — G250 Essential tremor: Secondary | ICD-10-CM | POA: Diagnosis not present

## 2022-02-13 MED ORDER — PRIMIDONE 50 MG PO TABS: ORAL_TABLET | ORAL | 3 refills | Status: DC

## 2022-02-13 MED ORDER — PROPRANOLOL HCL ER 160 MG PO CP24: 160.0000 mg | ORAL_CAPSULE | Freq: Every day | ORAL | 3 refills | Status: DC

## 2022-02-13 NOTE — Patient Instructions (Signed)
Your Plan:  Continue propranolol  Increase Primidone to 50 mg in the AM and 100 mg in the PM   Thank you for coming to see [place] at [facility]. I hope we have been able to provide you high quality care today.  You may receive a patient satisfaction survey over the next few weeks. We would appreciate your feedback and comments so that we may continue to improve ourselves and the health of our patients.

## 2022-02-13 NOTE — Progress Notes (Signed)
PATIENT: Kristie Miles DOB: Mar 15, 1965  REASON FOR VISIT: follow up HISTORY FROM: patient  Chief Complaint  Patient presents with   Follow-up    Pt in 4 Pt here for increased tremors  Pt states tremors are in both hands left hand is worse Pt states tremors in head and mouth     HISTORY OF PRESENT ILLNESS: Today 02/13/22:  Kristie Miles is a 57 y.o. female with a history of essential tremor and migraine headaches. Returns today for follow-up.   Tremor: Currently on primidone 100 mg at bedtime and propranolol extended release 160 mg daily.  Patient reports that her tremors have increased.  She states that it is in both hands left greater than right.  She also notices a tremor in the head and the mouth. Some trouble with feeding herself. Difficulty with holding a glass and writing. Reports that some days are better than others.    Migraines: Remains on Topamax 50 mg in the morning and 100 mg at bedtime. Feels that migraines are better and under control. Reports that the increase in Topamax was very helpful.    (copied from Dr. Guadelupe Sabin note), Kristie Miles is a 57 year old right-handed woman with an underlying medical history of reflux disease, history of TIA, cerebellar stroke, hyperlipidemia, smoking, depression, anxiety, arthritis, allergies, and borderline overweight state, who reports a longstanding history of migraine headaches, since she has been in college.  She has a strong family history of migraines as well, her father has migraines, paternal grandmother had migraines and her own mom had cluster headaches and ocular migraines.  Her daughter has migraines as well and a nephew.  She is currently on Topamax, has not quite increased it to 50 mg twice daily but is supposed to increase it.  Nurtec has not helped, she has not tried [place] yet.  She has been using Fioricet sparingly which helps.  She has tried and failed nortriptyline, propranolol, has been on Topamax, and has been on  Paxil, no longer takes Robaxin which was for back pain.   She has not been on a monthly injectable such as Aimovig or Ajovy or Emgality, she has not been on Botox but would be interested in pursuing Botox injections, her nephew gets them.   She is on propranolol for tremors.  She also takes Mysoline for tremor control.     She had a brain MRI without contrast on 08/31/2020 and I reviewed the results:   IMPRESSION: 1. No evidence of acute intracranial abnormality 2. Mild chronic microvascular ischemic disease and remote right cerebellar infarct. 3. Moderate paranasal sinus mucosal.   She has not noticed any obvious triggers.  In the past 6 months she has had nearly daily headaches.  She drinks caffeine in the form of green tea, typically 1 cup/day.  She quit smoking about a month ago.  She is off Chantix now.  She drinks alcohol occasionally.  She sleeps well, tries to get about 7 to 8 hours of sleep.  She has some snoring but no apneas reported per husband.  She wakes up well rested.  She does not have any visual auras for her migraines, no history of stroke symptoms but was found to have a lacunar stroke.  She has had no associated one-sided weakness or numbness or tingling or droopy face or slurring of speech.  With her migraine she has often left-sided symptoms with throbbing headache, sometimes it is a constant pressure-like pain.  She has associated nausea and light sensitivity  typically.  She is due for her eye examination.  She has prescription eyeglasses and feels it may need to be updated    2022: Kristie Miles is a 57 year old female with a history of essential tremor.  She returns today for follow-up.  She is currently on primidone and propranolol.  She states that these medications are working well for her.  She states that her stress level has also improved.  She denies any new symptoms.  She returns today for an evaluation.  09/24/19: Kristie Miles is a 57 year old female with a history of  essential tremor.  She returns today for follow-up.  Overall she feels that she is doing well on primidone and propranolol.  She did stop smoking 6 weeks ago using Chantix.  She stopped Chantix approximately 1 week ago.  She feels that her tremors have improved since she stopped smoking.  Subsequently she states that her headaches have gotten worse.  This is in the past been managed by PCP.  At the last visit she reported that she was recently started on nortriptyline however she does not recall why she is no longer taking this.  She returns today for an evaluation.  HISTORY 03/10/19:   Kristie Miles is a 57 year old female with a history of essential tremor.  She returns today for follow-up.  She continues on primidone and propranolol.  She feels that the combination of these medications have been beneficial.  She states that she notices the tremor in both upper extremities.  She notices that with her handwriting and holding a glass.  She also states that she cannot pain her fingernails.  She also notes that she has migraine headaches.  This is currently being managed by her primary care provider.  Notes that she has had a daily headache for quite some time.  Was recently started on nortriptyline 10 mg at bedtime.  Also states that she typically takes Tylenol or ibuprofen on a daily basis.  REVIEW OF SYSTEMS: Out of a complete 14 system review of symptoms, the patient complains only of the following symptoms, and all other reviewed systems are negative.  See HPI  ALLERGIES: Allergies  Allergen Reactions   Latex Itching   Sulfa Antibiotics Rash    HOME MEDICATIONS: Outpatient Medications Prior to Visit  Medication Sig Dispense Refill   aspirin 81 MG EC tablet Take 1 tablet (81 mg total) by mouth daily. Swallow whole.     atorvastatin (LIPITOR) 40 MG tablet Take 1 tablet (40 mg total) by mouth daily. 90 tablet 3   butalbital-acetaminophen-caffeine (FIORICET) 50-325-40 MG tablet Take 1 tablet by  mouth 2 (two) times daily as needed for migraine (sedation precautions). 30 tablet 0   Cholecalciferol (VITAMIN D) 50 MCG (2000 UT) CAPS Take 1 capsule (2,000 Units total) by mouth daily. 30 capsule    Cyanocobalamin (B-12 PO) Take by mouth daily.     esomeprazole (NEXIUM) 40 MG capsule Take 1 capsule (40 mg total) by mouth daily at 12 noon. 90 capsule 3   Flaxseed, Linseed, (FLAX SEEDS PO) Take by mouth.     ibuprofen (ADVIL) 800 MG tablet TAKE 1 TABLET BY MOUTH THREE TIMES A DAY 90 tablet 1   Multiple Vitamins-Minerals (CENTRUM PO) Take by mouth daily.     Nutritional Supplements (ESTROVEN PO) Take by mouth.     ondansetron (ZOFRAN) 4 MG tablet Take 1 tablet (4 mg total) by mouth every 8 (eight) hours as needed. 20 tablet 3   PARoxetine (PAXIL)  40 MG tablet Take 1 tablet (40 mg total) by mouth every morning. 90 tablet 3   primidone (MYSOLINE) 50 MG tablet TAKE 2 TABLETS BY MOUTH EVERY  NIGHT AT BEDTIME 180 tablet 0   propranolol ER (INDERAL LA) 160 MG SR capsule TAKE 1 CAPSULE BY MOUTH DAILY 90 capsule 3   Rhubarb (ESTROVEN MENOPAUSE RELIEF) 4 MG TABS Take by mouth.     topiramate (TOPAMAX) 50 MG tablet Take 1 tablet (50 mg total) by mouth daily AND 2 tablets (100 mg total) at bedtime. 90 tablet 3   varenicline (CHANTIX) 0.5 MG tablet TAKE 1 TABLET(0.5 MG) BY MOUTH TWICE DAILY 180 tablet 0   estradiol (ESTRACE) 0.1 MG/GM vaginal cream Place vaginally.     Facility-Administered Medications Prior to Visit  Medication Dose Route Frequency Provider Last Rate Last Admin   famotidine (PEPCID) tablet 20 mg  20 mg Oral Daily Nandigam, Venia Minks, MD        PAST MEDICAL HISTORY: Past Medical History:  Diagnosis Date   Allergy    Anxiety    Arthritis    Asthma    GERD (gastroesophageal reflux disease)    Hyperlipidemia    Mini stroke    Morton's neuroma 10/2020   followed by podiatry   Smoking    Stroke ([place])    Mini CVA - 2012    PAST SURGICAL HISTORY: Past Surgical History:   Procedure Laterality Date   BONE SPUR Bilateral    feet   NEURECTOMY FOOT Right 11/04/2020   Morton's neurectomy Amalia Hailey)   PLANTAR FASCIA SURGERY Bilateral     FAMILY HISTORY: Family History  Problem Relation Age of Onset   Arthritis Mother    Hyperlipidemia Mother    Hypertension Mother    Tremor Mother    Atrial fibrillation Mother        pacemaker   Leukemia Mother    Headache Mother    Valvular heart disease Mother    Asthma Father    Kidney cancer Father    Kidney disease Father 47   Migraines Father    Crohn's disease Sister    Alcohol abuse Maternal Grandmother    Arthritis Maternal Grandmother    Heart disease Maternal Grandfather    Alcohol abuse Maternal Grandfather    Arthritis Paternal Grandmother    Asthma Paternal Grandmother    Headache Paternal Grandmother    Asthma Daughter    Tremor Daughter    Migraines Nephew    Colon cancer Neg Hx    Esophageal cancer Neg Hx    Rectal cancer Neg Hx    Stomach cancer Neg Hx    Stroke Neg Hx     SOCIAL HISTORY: Social History   Socioeconomic History   Marital status: Married    Spouse name: Not on file   Number of children: 1   Years of education: Not on file   Highest education level: Not on file  Occupational History   Occupation: Copywriter, advertising: [employer]  Tobacco Use   Smoking status: Every Day    Packs/day: 0.25    Types: Cigarettes    Last attempt to quit: 08/02/2021    Years since quitting: 0.5   Smokeless tobacco: Never   Tobacco comments:    less then 1/2 a pack a day.  prev able to tolerate chantix  Vaping Use   Vaping Use: Never used  Substance and Sexual Activity   Alcohol use: Yes    Alcohol/week: 7.0 standard  drinks of alcohol    Types: 7 Glasses of wine per week    Comment: social drinker   Drug use: No   Sexual activity: Not on file  Other Topics Concern   Not on file  Social History Narrative   Not on file   Social Determinants of Health   Financial Resource  Strain: Not on file  Food Insecurity: Not on file  Transportation Needs: Not on file  Physical Activity: Not on file  Stress: Not on file  Social Connections: Not on file  Intimate Partner Violence: Not on file      PHYSICAL EXAM  Vitals:   02/13/22 0814  BP: 124/86  Pulse: 62  Weight: 142 lb 6.4 oz (64.6 kg)  Height: '5\' 4"'$  (1.626 m)   Body mass index is 24.44 kg/m.  Generalized: Well developed, in no acute distress   Neurological examination  Mentation: Alert oriented to time, place, history taking. Follows all commands speech and language fluent Cranial nerve II-XII: Pupils were equal round reactive to light. Extraocular movements were full, visual field were full on confrontational test.  Head turning and shoulder shrug  were normal and symmetric.  Tremor noted in the jaw and the head Motor: The motor testing reveals 5 over 5 strength of all 4 extremities. Good symmetric motor tone is noted throughout. NO Impairment of finger taps and rapid alternating movements Sensory: Sensory testing is intact to soft touch on all 4 extremities. No evidence of extinction is noted.  Coordination: Cerebellar testing reveals good finger-nose-finger and heel-to-shin bilaterally.  Intention tremor noted in the upper extremities left greater than right Gait and station: Gait is normal.  Good armswing.  One-step turns  DIAGNOSTIC DATA (LABS, IMAGING, TESTING) - I reviewed patient records, labs, notes, testing and imaging myself where available.  Lab Results  Component Value Date   WBC 5.0 12/28/2021   HGB 13.0 12/28/2021   HCT 37.8 12/28/2021   MCV 97 12/28/2021   PLT 252 12/28/2021      Component Value Date/Time   NA 142 12/28/2021 0945   K 4.5 12/28/2021 0945   CL 106 12/28/2021 0945   CO2 19 (L) 12/28/2021 0945   GLUCOSE 130 (H) 12/28/2021 0945   GLUCOSE 114 (H) 06/28/2019 1710   BUN 12 12/28/2021 0945   CREATININE 0.67 12/28/2021 0945   CALCIUM 9.1 12/28/2021 0945   PROT 6.3  12/28/2021 0945   ALBUMIN 4.5 12/28/2021 0945   AST 27 12/28/2021 0945   ALT 52 (H) 12/28/2021 0945   ALKPHOS 95 12/28/2021 0945   BILITOT 0.2 12/28/2021 0945   GFRNONAA >60 06/28/2019 1710   GFRAA >60 06/28/2019 1710   Lab Results  Component Value Date   CHOL 180 12/28/2021   HDL 47 12/28/2021   LDLCALC 84 12/28/2021   LDLDIRECT 91.0 04/25/2019   TRIG 300 (H) 12/28/2021   CHOLHDL 3.8 12/28/2021      ASSESSMENT AND PLAN 57 y.o. year old female  has a past medical history of Allergy, Anxiety, Arthritis, Asthma, GERD (gastroesophageal reflux disease), Hyperlipidemia, Mini stroke, Morton's neuroma (10/2020), Smoking, and Stroke ([place]). here with:  1.  Essential tremor 2.  Migraine headaches  Increase primidone to 50 mg in the morning and 100 mg at bedtime Continue propranolol ER 160 mg daily Migraines are stable continue Topamax 50 mg in the morning and 100 mg at bedtime She will follow-up in 6 months or sooner if needed.    Ward Givens, MSN, NP-C 02/13/2022, 8:20 AM  [facility] [address], [place], [place] [address] [phone]

## 2022-02-17 ENCOUNTER — Ambulatory Visit: Payer: Managed Care, Other (non HMO) | Admitting: Podiatry

## 2022-02-27 ENCOUNTER — Ambulatory Visit: Payer: Managed Care, Other (non HMO) | Admitting: Podiatry

## 2022-02-28 ENCOUNTER — Ambulatory Visit: Payer: Managed Care, Other (non HMO) | Admitting: Podiatry

## 2022-03-13 ENCOUNTER — Encounter: Payer: Self-pay | Admitting: Family Medicine

## 2022-03-14 MED ORDER — ESTRADIOL 0.1 MG/GM VA CREA: 1.0000 | TOPICAL_CREAM | VAGINAL | 0 refills | Status: DC

## 2022-03-17 MED ORDER — ESTRADIOL 0.1 MG/GM VA CREA: 1.0000 | TOPICAL_CREAM | VAGINAL | 0 refills | Status: DC

## 2022-03-17 NOTE — Addendum Note (Signed)
Addended by: Ria Bush on: 03/17/2022 07:53 AM   Modules accepted: Orders

## 2022-03-30 ENCOUNTER — Other Ambulatory Visit: Payer: Self-pay | Admitting: *Deleted

## 2022-03-30 DIAGNOSIS — Z87891 Personal history of nicotine dependence: Secondary | ICD-10-CM

## 2022-03-30 DIAGNOSIS — Z122 Encounter for screening for malignant neoplasm of respiratory organs: Secondary | ICD-10-CM

## 2022-03-30 DIAGNOSIS — F17210 Nicotine dependence, cigarettes, uncomplicated: Secondary | ICD-10-CM

## 2022-04-21 ENCOUNTER — Encounter: Payer: Self-pay | Admitting: Family Medicine

## 2022-04-24 NOTE — Telephone Encounter (Signed)
Last rx sent on 01/04/22, #90/3 to [facility].   Spoke with [facility] asking about rx. State they have rx on file but have not had any requests to fill it. Says she will get it filled for pt.   Spoke with pt relaying info above and reminded pt to call in refills from a current rx. Pt verbalizes understanding and expresses her thanks.

## 2022-04-25 ENCOUNTER — Encounter: Payer: Self-pay | Admitting: Acute Care

## 2022-04-25 ENCOUNTER — Ambulatory Visit (INDEPENDENT_AMBULATORY_CARE_PROVIDER_SITE_OTHER): Payer: Managed Care, Other (non HMO) | Admitting: Acute Care

## 2022-04-25 DIAGNOSIS — F17210 Nicotine dependence, cigarettes, uncomplicated: Secondary | ICD-10-CM

## 2022-04-25 NOTE — Progress Notes (Signed)
Virtual Visit via Telephone Note  I connected with Kristie Miles on 04/25/22 at  3:30 PM EDT by telephone and verified that I am speaking with the correct person using two identifiers.  Location: Patient:  At home Provider: [address] [place], [place], [address]    I discussed the limitations, risks, security and privacy concerns of performing an evaluation and management service by telephone and the availability of in person appointments. I also discussed with the patient that there may be a patient responsible charge related to this service. The patient expressed understanding and agreed to proceed.  Shared Decision Making Visit Lung Cancer Screening Program [phone])   Eligibility: Age 57 y.o. Pack Years Smoking History Calculation 20 pack year smoking history (# packs/per year x # years smoked) Recent History of coughing up blood  no Unexplained weight loss? no ( >Than 15 pounds within the last 6 months ) Prior History Lung / other cancer no (Diagnosis within the last 5 years already requiring surveillance chest CT Scans). Smoking Status Current Smoker Former Smokers: Years since quit:  NA  Quit Date:  NA  Visit Components: Discussion included one or more decision making aids. yes Discussion included risk/benefits of screening. yes Discussion included potential follow up diagnostic testing for abnormal scans. yes Discussion included meaning and risk of over diagnosis. yes Discussion included meaning and risk of False Positives. yes Discussion included meaning of total radiation exposure. yes  Counseling Included: Importance of adherence to annual lung cancer LDCT screening. yes Impact of comorbidities on ability to participate in the program. yes Ability and willingness to under diagnostic treatment. yes  Smoking Cessation Counseling: Current Smokers:  Discussed importance of smoking cessation. yes Information about tobacco cessation classes and interventions  provided to patient. yes Patient provided with "ticket" for LDCT Scan. yes Symptomatic Patient. no  Counseling NA Diagnosis Code: Tobacco Use Z72.0 Asymptomatic Patient yes  Counseling (Intermediate counseling: > three minutes counseling) U0454 Former Smokers:  Discussed the importance of maintaining cigarette abstinence. yes Diagnosis Code: Personal History of Nicotine Dependence. U98.119 Information about tobacco cessation classes and interventions provided to patient. Yes Patient provided with "ticket" for LDCT Scan. yes Written Order for Lung Cancer Screening with LDCT placed in Epic. Yes (CT Chest Lung Cancer Screening Low Dose W/O CM) [id number] Z12.2-Screening of respiratory organs Z87.891-Personal history of nicotine dependence   Has Chantix RX and is contemplating smoking cessation   I have spent 25 minutes of face to face/ virtual visit   time with  Kristie Miles discussing the risks and benefits of lung cancer screening. We viewed / discussed a power point together that explained in detail the above noted topics. We paused at intervals to allow for questions to be asked and answered to ensure understanding.We discussed that the single most powerful action that she can take to decrease her risk of developing lung cancer is to quit smoking. We discussed whether or not she is ready to commit to setting a quit date. We discussed options for tools to aid in quitting smoking including nicotine replacement therapy, non-nicotine medications, support groups, Quit Smart classes, and behavior modification. We discussed that often times setting smaller, more achievable goals, such as eliminating 1 cigarette a day for a week and then 2 cigarettes a day for a week can be helpful in slowly decreasing the number of cigarettes smoked. This allows for a sense of accomplishment as well as providing a clinical benefit. I provided  her  with smoking cessation  information  with contact information for community  resources, classes, free nicotine replacement therapy, and access to mobile apps, text messaging, and on-line smoking cessation help. I have also provided  her  the office contact information in the event she needs to contact me, or the screening staff. We discussed the time and location of the scan, and that either Abigail Miyamoto RN, Karlton Lemon, RN  or I will call / send a letter with the results within 24-72 hours of receiving them. The patient verbalized understanding of all of  the above and had no further questions upon leaving the office. They have my contact information in the event they have any further questions.  I spent 3-4 minutes counseling on smoking cessation and the health risks of continued tobacco abuse.  I explained to the patient that there has been a high incidence of coronary artery disease noted on these exams. I explained that this is a non-gated exam therefore degree or severity cannot be determined. This patient is on statin therapy. I have asked the patient to follow-up with their PCP regarding any incidental finding of coronary artery disease and management with diet or medication as their PCP  feels is clinically indicated. The patient verbalized understanding of the above and had no further questions upon completion of the visit.      Bevelyn Ngo, NP 04/25/2022

## 2022-04-25 NOTE — Patient Instructions (Signed)
Thank you for participating in the [facility]. It was our pleasure to meet you today. We will call you with the results of your scan within the next few days. Your scan will be assigned a Lung RADS category score by the physicians reading the scans.  This Lung RADS score determines follow up scanning.  See below for description of categories, and follow up screening recommendations. We will be in touch to schedule your follow up screening annually or based on recommendations of our providers. We will fax a copy of your scan results to your Primary Care Physician, or the physician who referred you to the program, to ensure they have the results. Please call the office if you have any questions or concerns regarding your scanning experience or results.  Our office number is [phone]. Please speak with Denise Phelps, RN. , or  Denise Buckner RN, They are  our Lung Cancer Screening RN.'s If They are unavailable when you call, Please leave a message on the voice mail. We will return your call at our earliest convenience.This voice mail is monitored several times a day.  Remember, if your scan is normal, we will scan you annually as long as you continue to meet the criteria for the program. (Age 50-80, Current smoker or smoker who has quit within the last 15 years). If you are a smoker, remember, quitting is the single most powerful action that you can take to decrease your risk of lung cancer and other pulmonary, breathing related problems. We know quitting is hard, and we are here to help.  Please let us know if there is anything we can do to help you meet your goal of quitting. If you are a former smoker, congratulations. We are proud of you! Remain smoke free! Remember you can refer friends or family members through the number above.  We will screen them to make sure they meet criteria for the program. Thank you for helping us take better care of you by  participating in Lung Screening.  You can receive free nicotine replacement therapy ( patches, gum or mints) by calling 1-800-QUIT NOW. Please call so we can get you on the path to becoming  a non-smoker. I know it is hard, but you can do this!  Lung RADS Categories:  Lung RADS 1: no nodules or definitely non-concerning nodules.  Recommendation is for a repeat annual scan in 12 months.  Lung RADS 2:  nodules that are non-concerning in appearance and behavior with a very low likelihood of becoming an active cancer. Recommendation is for a repeat annual scan in 12 months.  Lung RADS 3: nodules that are probably non-concerning , includes nodules with a low likelihood of becoming an active cancer.  Recommendation is for a 6-month repeat screening scan. Often noted after an upper respiratory illness. We will be in touch to make sure you have no questions, and to schedule your 6-month scan.  Lung RADS 4 A: nodules with concerning findings, recommendation is most often for a follow up scan in 3 months or additional testing based on our provider's assessment of the scan. We will be in touch to make sure you have no questions and to schedule the recommended 3 month follow up scan.  Lung RADS 4 B:  indicates findings that are concerning. We will be in touch with you to schedule additional diagnostic testing based on our provider's  assessment of the scan.  Other options for assistance in smoking cessation (   As covered by your insurance benefits)  Hypnosis for smoking cessation  [employer] [phone]  Acupuncture for smoking cessation  [facility] [phone]   

## 2022-04-26 ENCOUNTER — Ambulatory Visit
Admission: RE | Admit: 2022-04-26 | Discharge: 2022-04-26 | Disposition: A | Discharge status: Home / Self Care | Payer: Managed Care, Other (non HMO) | Source: Ambulatory Visit | Attending: Acute Care | Admitting: Acute Care

## 2022-04-26 DIAGNOSIS — F17210 Nicotine dependence, cigarettes, uncomplicated: Secondary | ICD-10-CM

## 2022-04-26 DIAGNOSIS — Z87891 Personal history of nicotine dependence: Secondary | ICD-10-CM

## 2022-04-26 DIAGNOSIS — Z122 Encounter for screening for malignant neoplasm of respiratory organs: Secondary | ICD-10-CM

## 2022-05-02 ENCOUNTER — Other Ambulatory Visit: Payer: Self-pay

## 2022-05-02 DIAGNOSIS — F17210 Nicotine dependence, cigarettes, uncomplicated: Secondary | ICD-10-CM

## 2022-05-02 DIAGNOSIS — Z122 Encounter for screening for malignant neoplasm of respiratory organs: Secondary | ICD-10-CM

## 2022-05-02 DIAGNOSIS — Z87891 Personal history of nicotine dependence: Secondary | ICD-10-CM

## 2022-05-10 ENCOUNTER — Other Ambulatory Visit: Payer: Self-pay | Admitting: Family Medicine

## 2022-05-10 DIAGNOSIS — G43009 Migraine without aura, not intractable, without status migrainosus: Secondary | ICD-10-CM

## 2022-05-10 NOTE — Telephone Encounter (Signed)
Name of Medication: Fioricet Name of Pharmacy: [facility] Last Fill or Written Date and Quantity: 12/21/21, #30 Last Office Visit and Type: 01/03/22, CPE Next Office Visit and Type: none Last Controlled Substance Agreement Date: none Last UDS: none

## 2022-05-12 MED ORDER — BUTALBITAL-APAP-CAFFEINE 50-325-40 MG PO TABS: 1.0000 | ORAL_TABLET | Freq: Two times a day (BID) | ORAL | 0 refills | Status: DC | PRN

## 2022-05-12 NOTE — Telephone Encounter (Signed)
ERx 

## 2022-05-13 ENCOUNTER — Other Ambulatory Visit: Payer: Self-pay | Admitting: Family Medicine

## 2022-05-13 DIAGNOSIS — G43009 Migraine without aura, not intractable, without status migrainosus: Secondary | ICD-10-CM

## 2022-05-15 NOTE — Telephone Encounter (Signed)
Totipalmate Last filled:  03/19/22. #90 Last OV:  01/03/22, CPE Next OV:  none

## 2022-06-07 ENCOUNTER — Other Ambulatory Visit: Payer: Self-pay | Admitting: Family Medicine

## 2022-06-07 DIAGNOSIS — E785 Hyperlipidemia, unspecified: Secondary | ICD-10-CM

## 2022-06-09 ENCOUNTER — Other Ambulatory Visit: Payer: Self-pay | Admitting: Family Medicine

## 2022-06-09 DIAGNOSIS — G43009 Migraine without aura, not intractable, without status migrainosus: Secondary | ICD-10-CM

## 2022-06-09 NOTE — Telephone Encounter (Signed)
Name of Medication: Fioricet Name of Pharmacy: [facility] Last Fill or Written Date and Quantity: 03/20/22, #30 Last Office Visit and Type: 01/03/22, CPE Next Office Visit and Type: none Last Controlled Substance Agreement Date: none Last UDS: none  Estradiol crm last rx:  03/20/22, #42.5 g

## 2022-06-11 MED ORDER — BUTALBITAL-APAP-CAFFEINE 50-325-40 MG PO TABS
1.0000 | ORAL_TABLET | Freq: Two times a day (BID) | ORAL | 0 refills | Status: DC | PRN
Start: 2022-06-11 — End: 2022-07-26

## 2022-06-11 MED ORDER — ESTRADIOL 0.1 MG/GM VA CREA: 1.0000 | TOPICAL_CREAM | VAGINAL | 3 refills | Status: DC

## 2022-06-11 NOTE — Telephone Encounter (Signed)
ERx 

## 2022-06-14 ENCOUNTER — Other Ambulatory Visit: Payer: Self-pay | Admitting: Family Medicine

## 2022-06-14 DIAGNOSIS — B009 Herpesviral infection, unspecified: Secondary | ICD-10-CM

## 2022-06-15 NOTE — Telephone Encounter (Signed)
Valtrex Last filled:  02/03/22, #20 Last OV:  01/03/22, CPE Next OV:  none

## 2022-06-16 ENCOUNTER — Ambulatory Visit: Payer: Managed Care, Other (non HMO) | Admitting: Family Medicine

## 2022-07-08 ENCOUNTER — Other Ambulatory Visit: Payer: Self-pay | Admitting: Gastroenterology

## 2022-07-08 DIAGNOSIS — K219 Gastro-esophageal reflux disease without esophagitis: Secondary | ICD-10-CM

## 2022-07-26 ENCOUNTER — Other Ambulatory Visit: Payer: Self-pay | Admitting: Family Medicine

## 2022-07-26 DIAGNOSIS — G43009 Migraine without aura, not intractable, without status migrainosus: Secondary | ICD-10-CM

## 2022-07-27 MED ORDER — BUTALBITAL-APAP-CAFFEINE 50-325-40 MG PO TABS
1.0000 | ORAL_TABLET | Freq: Two times a day (BID) | ORAL | 0 refills | Status: DC | PRN
Start: 2022-07-27 — End: 2022-10-10

## 2022-07-27 NOTE — Telephone Encounter (Signed)
ERx 

## 2022-07-27 NOTE — Telephone Encounter (Signed)
Name of Medication:  Fioricet Name of Pharmacy:  [facility] Last Fill or Written Date and Quantity:  06/11/22, #30 Last Office Visit and Type:  01/03/22, CPE Next Office Visit and Type:  none Last Controlled Substance Agreement Date:  none Last UDS:  none

## 2022-09-05 NOTE — Progress Notes (Signed)
PATIENT: Kristie Miles DOB: 06-Feb-1965  REASON FOR VISIT: follow up HISTORY FROM: patient  Chief Complaint  Patient presents with   Follow-up    RM 89 with daughter [place] Pt is well, reports her hand tremor is better but the tremor in her head and mouth has worsen since last visit, as well as daily headaches.     HISTORY OF PRESENT ILLNESS: Today 09/05/22:   Kristie Miles is a 57 y.o. female with a history of essential tremor and migraine headaches. Returns today for follow-up.   Tremor: Currently on primidone 50 mg ain the AM and 100 mg at bedtime and propranolol extended release 160 mg daily.  Feels that tremor in head and mouth are worse. Feels that medication increased helped initially but has worn off.    Migraines: Remains on Topamax 50 mg in the morning and 100 mg at bedtime. Headaches are daily. Has been taking ibuprofen daily. Dorris Singh and nurtec in the past. Has Fioricet but doesn't use it often  (copied from Dr. Teofilo Pod note), Kristie Miles is a 57 year old right-handed woman with an underlying medical history of reflux disease, history of TIA, cerebellar stroke, hyperlipidemia, smoking, depression, anxiety, arthritis, allergies, and borderline overweight state, who reports a longstanding history of migraine headaches, since she has been in college.  She has a strong family history of migraines as well, her father has migraines, paternal grandmother had migraines and her own mom had cluster headaches and ocular migraines.  Her daughter has migraines as well and a nephew.  She is currently on Topamax, has not quite increased it to 50 mg twice daily but is supposed to increase it.  Nurtec has not helped, she has not tried [place] yet.  She has been using Fioricet sparingly which helps.  She has tried and failed nortriptyline, propranolol, has been on Topamax, and has been on Paxil, no longer takes Robaxin which was for back pain.   She has not been on a monthly injectable  such as Aimovig or Ajovy or Emgality, she has not been on Botox but would be interested in pursuing Botox injections, her nephew gets them.   She is on propranolol for tremors.  She also takes Mysoline for tremor control.     She had a brain MRI without contrast on 08/31/2020 and I reviewed the results:   IMPRESSION: 1. No evidence of acute intracranial abnormality 2. Mild chronic microvascular ischemic disease and remote right cerebellar infarct. 3. Moderate paranasal sinus mucosal.   She has not noticed any obvious triggers.  In the past 6 months she has had nearly daily headaches.  She drinks caffeine in the form of green tea, typically 1 cup/day.  She quit smoking about a month ago.  She is off Chantix now.  She drinks alcohol occasionally.  She sleeps well, tries to get about 7 to 8 hours of sleep.  She has some snoring but no apneas reported per husband.  She wakes up well rested.  She does not have any visual auras for her migraines, no history of stroke symptoms but was found to have a lacunar stroke.  She has had no associated one-sided weakness or numbness or tingling or droopy face or slurring of speech.  With her migraine she has often left-sided symptoms with throbbing headache, sometimes it is a constant pressure-like pain.  She has associated nausea and light sensitivity typically.  She is due for her eye examination.  She has prescription eyeglasses and feels it may  need to be updated    2022: Kristie Miles is a 57 year old female with a history of essential tremor.  She returns today for follow-up.  She is currently on primidone and propranolol.  She states that these medications are working well for her.  She states that her stress level has also improved.  She denies any new symptoms.  She returns today for an evaluation.  09/24/19: Kristie Miles is a 57 year old female with a history of essential tremor.  She returns today for follow-up.  Overall she feels that she is doing well on  primidone and propranolol.  She did stop smoking 6 weeks ago using Chantix.  She stopped Chantix approximately 1 week ago.  She feels that her tremors have improved since she stopped smoking.  Subsequently she states that her headaches have gotten worse.  This is in the past been managed by PCP.  At the last visit she reported that she was recently started on nortriptyline however she does not recall why she is no longer taking this.  She returns today for an evaluation.  HISTORY 03/10/19:   Kristie Miles is a 57 year old female with a history of essential tremor.  She returns today for follow-up.  She continues on primidone and propranolol.  She feels that the combination of these medications have been beneficial.  She states that she notices the tremor in both upper extremities.  She notices that with her handwriting and holding a glass.  She also states that she cannot pain her fingernails.  She also notes that she has migraine headaches.  This is currently being managed by her primary care provider.  Notes that she has had a daily headache for quite some time.  Was recently started on nortriptyline 10 mg at bedtime.  Also states that she typically takes Tylenol or ibuprofen on a daily basis.  REVIEW OF SYSTEMS: Out of a complete 14 system review of symptoms, the patient complains only of the following symptoms, and all other reviewed systems are negative.  See HPI  ALLERGIES: Allergies  Allergen Reactions   Latex Itching   Sulfa Antibiotics Rash    HOME MEDICATIONS: Outpatient Medications Prior to Visit  Medication Sig Dispense Refill   aspirin 81 MG EC tablet Take 1 tablet (81 mg total) by mouth daily. Swallow whole.     atorvastatin (LIPITOR) 40 MG tablet TAKE 1 TABLET BY MOUTH DAILY 90 tablet 1   butalbital-acetaminophen-caffeine (FIORICET) 50-325-40 MG tablet Take 1 tablet by mouth 2 (two) times daily as needed for migraine (sedation precautions). 30 tablet 0   Cholecalciferol (VITAMIN D)  50 MCG (2000 UT) CAPS Take 1 capsule (2,000 Units total) by mouth daily. 30 capsule    Cyanocobalamin (B-12 PO) Take by mouth daily.     esomeprazole (NEXIUM) 40 MG capsule TAKE 1 CAPSULE BY MOUTH DAILY AT 12 NOON 90 capsule 3   estradiol (ESTRACE) 0.1 MG/GM vaginal cream Place 1 Applicatorful vaginally 2 (two) times a week. 42.5 g 3   Flaxseed, Linseed, (FLAX SEEDS PO) Take by mouth.     ibuprofen (ADVIL) 800 MG tablet TAKE 1 TABLET BY MOUTH THREE TIMES A DAY 90 tablet 1   Multiple Vitamins-Minerals (CENTRUM PO) Take by mouth daily.     Nutritional Supplements (ESTROVEN PO) Take by mouth.     ondansetron (ZOFRAN) 4 MG tablet Take 1 tablet (4 mg total) by mouth every 8 (eight) hours as needed. 20 tablet 3   PARoxetine (PAXIL) 40 MG tablet Take  1 tablet (40 mg total) by mouth every morning. 90 tablet 3   primidone (MYSOLINE) 50 MG tablet Take 1 tablet in the AM and 2 tablets in the PM 270 tablet 3   propranolol ER (INDERAL LA) 160 MG SR capsule Take 1 capsule (160 mg total) by mouth daily. 90 capsule 3   Rhubarb (ESTROVEN MENOPAUSE RELIEF) 4 MG TABS Take by mouth.     topiramate (TOPAMAX) 50 MG tablet Take 1 tablet (50 mg total) by mouth daily AND 2 tablets (100 mg total) at bedtime. 270 tablet 1   varenicline (CHANTIX) 0.5 MG tablet TAKE 1 TABLET(0.5 MG) BY MOUTH TWICE DAILY 180 tablet 0   Facility-Administered Medications Prior to Visit  Medication Dose Route Frequency Provider Last Rate Last Admin   famotidine (PEPCID) tablet 20 mg  20 mg Oral Daily Nandigam, Eleonore Chiquito, MD        PAST MEDICAL HISTORY: Past Medical History:  Diagnosis Date   Allergy    Anxiety    Arthritis    Asthma    GERD (gastroesophageal reflux disease)    Hyperlipidemia    Mini stroke    Morton's neuroma 10/2020   followed by podiatry   Smoking    Stroke (HCC)    Mini CVA - 2012    PAST SURGICAL HISTORY: Past Surgical History:  Procedure Laterality Date   BONE SPUR Bilateral    feet   NEURECTOMY FOOT  Right 11/04/2020   Morton's neurectomy [facility])   PLANTAR FASCIA SURGERY Bilateral     FAMILY HISTORY: Family History  Problem Relation Age of Onset   Arthritis Mother    Hyperlipidemia Mother    Hypertension Mother    Tremor Mother    Atrial fibrillation Mother        pacemaker   Leukemia Mother    Headache Mother    Valvular heart disease Mother    Asthma Father    Kidney cancer Father    Kidney disease Father 94   Migraines Father    Crohn's disease Sister    Alcohol abuse Maternal Grandmother    Arthritis Maternal Grandmother    Heart disease Maternal Grandfather    Alcohol abuse Maternal Grandfather    Arthritis Paternal Grandmother    Asthma Paternal Grandmother    Headache Paternal Grandmother    Asthma Daughter    Tremor Daughter    Migraines Nephew    Colon cancer Neg Hx    Esophageal cancer Neg Hx    Rectal cancer Neg Hx    Stomach cancer Neg Hx    Stroke Neg Hx     SOCIAL HISTORY: Social History   Socioeconomic History   Marital status: Married    Spouse name: Not on file   Number of children: 1   Years of education: Not on file   Highest education level: Not on file  Occupational History   Occupation: Designer, television/film set: [employer]  Tobacco Use   Smoking status: Every Day    Current packs/day: 0.00    Average packs/day: 0.5 packs/day for 41.0 years (20.5 ttl pk-yrs)    Types: Cigarettes    Start date: 08/02/1980    Last attempt to quit: 08/02/2021    Years since quitting: 1.0   Smokeless tobacco: Never   Tobacco comments:    less then 1/2 a pack a day.  prev able to tolerate chantix  Vaping Use   Vaping status: Never Used  Substance and Sexual Activity  Alcohol use: Yes    Alcohol/week: 7.0 standard drinks of alcohol    Types: 7 Glasses of wine per week    Comment: social drinker   Drug use: No   Sexual activity: Not on file  Other Topics Concern   Not on file  Social History Narrative   Not on file   Social Determinants of Health    Financial Resource Strain: Not on file  Food Insecurity: Not on file  Transportation Needs: Not on file  Physical Activity: Not on file  Stress: Not on file  Social Connections: Not on file  Intimate Partner Violence: Not on file      PHYSICAL EXAM  Vitals:   09/06/22 1433  BP: 116/74  Pulse: 64  Weight: 135 lb (61.2 kg)  Height: 5\' 4"  (1.626 m)    Body mass index is 23.17 kg/m.  Generalized: Well developed, in no acute distress   Neurological examination  Mentation: Alert oriented to time, place, history taking. Follows all commands speech and language fluent Cranial nerve II-XII: Pupils were equal round reactive to light. Extraocular movements were full, visual field were full on confrontational test.  Head turning and shoulder shrug  were normal and symmetric.  Tremor noted in the jaw and the head Motor: The motor testing reveals 5 over 5 strength of all 4 extremities. Good symmetric motor tone is noted throughout. Sensory: Sensory testing is intact to soft touch on all 4 extremities. No evidence of extinction is noted.  Coordination: Cerebellar testing reveals good finger-nose-finger and heel-to-shin bilaterally.  Intention tremor noted in the upper extremities left greater than right Gait and station: Gait is normal.  Good armswing.  DIAGNOSTIC DATA (LABS, IMAGING, TESTING) - I reviewed patient records, labs, notes, testing and imaging myself where available.  Lab Results  Component Value Date   WBC 5.0 12/28/2021   HGB 13.0 12/28/2021   HCT 37.8 12/28/2021   MCV 97 12/28/2021   PLT 252 12/28/2021      Component Value Date/Time   NA 142 12/28/2021 0945   K 4.5 12/28/2021 0945   CL 106 12/28/2021 0945   CO2 19 (L) 12/28/2021 0945   GLUCOSE 130 (H) 12/28/2021 0945   GLUCOSE 114 (H) 06/28/2019 1710   BUN 12 12/28/2021 0945   CREATININE 0.67 12/28/2021 0945   CALCIUM 9.1 12/28/2021 0945   PROT 6.3 12/28/2021 0945   ALBUMIN 4.5 12/28/2021 0945   AST 27  12/28/2021 0945   ALT 52 (H) 12/28/2021 0945   ALKPHOS 95 12/28/2021 0945   BILITOT 0.2 12/28/2021 0945   GFRNONAA >60 06/28/2019 1710   GFRAA >60 06/28/2019 1710   Lab Results  Component Value Date   CHOL 180 12/28/2021   HDL 47 12/28/2021   LDLCALC 84 12/28/2021   LDLDIRECT 91.0 04/25/2019   TRIG 300 (H) 12/28/2021   CHOLHDL 3.8 12/28/2021      ASSESSMENT AND PLAN 57 y.o. year old female  has a past medical history of Allergy, Anxiety, Arthritis, Asthma, GERD (gastroesophageal reflux disease), Hyperlipidemia, Mini stroke, Morton's neuroma (10/2020), Smoking, and Stroke (HCC). here with:  1.  Essential tremor 2.  Migraine headaches  Increase primidone to 50 mg in the morning, 25 mg at noon  and 100 mg at bedtime Continue propranolol ER 160 mg daily Continue Topamax 50 mg in the morning and 100 mg at bedtime Advised to avoid all OTC medication. Daily headache could be from medication overuse.  In the future could increase topamax if needed  to 100 mg BID She will follow-up in 6 months or sooner if needed.    Butch Penny, MSN, NP-C 09/05/2022, 4:30 PM [facility] [address], [address] [place], [place] [address] [phone]

## 2022-09-06 ENCOUNTER — Encounter: Payer: Self-pay | Admitting: Adult Health

## 2022-09-06 ENCOUNTER — Ambulatory Visit: Payer: Managed Care, Other (non HMO) | Admitting: Adult Health

## 2022-09-06 VITALS — BP 116/74 | HR 64 | Ht 64.0 in | Wt 135.0 lb

## 2022-09-06 DIAGNOSIS — G43019 Migraine without aura, intractable, without status migrainosus: Secondary | ICD-10-CM

## 2022-09-06 DIAGNOSIS — G250 Essential tremor: Secondary | ICD-10-CM | POA: Diagnosis not present

## 2022-09-06 MED ORDER — PRIMIDONE 50 MG PO TABS: ORAL_TABLET | ORAL | 3 refills | Status: DC

## 2022-09-11 ENCOUNTER — Encounter: Payer: Self-pay | Admitting: Adult Health

## 2022-09-11 NOTE — Telephone Encounter (Signed)
  Increase primidone to 50 mg in the morning, 25 mg at noon  and 100 mg at bedtime Continue propranolol ER 160 mg daily Continue Topamax 50 mg in the morning and 100 mg at bedtime Advised to avoid all OTC medication. Daily headache could be from medication overuse.  In the future could increase topamax if needed to 100 mg BID She will follow-up in 6 months or sooner if needed.   from last office note

## 2022-09-25 ENCOUNTER — Other Ambulatory Visit: Payer: Self-pay | Admitting: Family Medicine

## 2022-09-25 DIAGNOSIS — G43009 Migraine without aura, not intractable, without status migrainosus: Secondary | ICD-10-CM

## 2022-10-10 ENCOUNTER — Other Ambulatory Visit: Payer: Self-pay | Admitting: Family Medicine

## 2022-10-10 DIAGNOSIS — G43009 Migraine without aura, not intractable, without status migrainosus: Secondary | ICD-10-CM

## 2022-10-10 NOTE — Telephone Encounter (Addendum)
Message from pt via pharmacy:   Please change pharmacy to [facility] [address] [phone]   Name of Medication:  Fioricet Name of Pharmacy:  [address] Last Fill or Written Date and Quantity:  07/27/22, #30 Last Office Visit and Type:  01/03/22, CPE Next Office Visit and Type:  none Last Controlled Substance Agreement Date:  none Last UDS:  none

## 2022-10-12 MED ORDER — BUTALBITAL-APAP-CAFFEINE 50-325-40 MG PO TABS
1.0000 | ORAL_TABLET | Freq: Two times a day (BID) | ORAL | 0 refills | Status: DC | PRN
Start: 2022-10-12 — End: 2023-01-08

## 2022-10-12 NOTE — Telephone Encounter (Signed)
ERx 

## 2022-10-30 ENCOUNTER — Encounter: Payer: Self-pay | Admitting: Family Medicine

## 2022-10-30 DIAGNOSIS — F17200 Nicotine dependence, unspecified, uncomplicated: Secondary | ICD-10-CM

## 2022-10-30 DIAGNOSIS — B009 Herpesviral infection, unspecified: Secondary | ICD-10-CM

## 2022-10-30 MED ORDER — VALACYCLOVIR HCL 1 G PO TABS: 2000.0000 mg | ORAL_TABLET | Freq: Two times a day (BID) | ORAL | 3 refills | Status: DC

## 2022-10-30 MED ORDER — VARENICLINE TARTRATE 0.5 MG PO TABS: 0.5000 mg | ORAL_TABLET | Freq: Two times a day (BID) | ORAL | 0 refills | Status: DC

## 2022-10-30 NOTE — Telephone Encounter (Signed)
Last OV:  01/03/22, CPE Next OV:  none

## 2022-11-08 ENCOUNTER — Other Ambulatory Visit: Payer: Self-pay | Admitting: Family Medicine

## 2022-11-08 DIAGNOSIS — E785 Hyperlipidemia, unspecified: Secondary | ICD-10-CM

## 2022-11-09 NOTE — Telephone Encounter (Signed)
Patient needs CPE scheduled for January 2025; please call to schedule.

## 2022-11-10 NOTE — Telephone Encounter (Signed)
LVM for patient to c/b and schedule.  

## 2022-11-10 NOTE — Telephone Encounter (Signed)
Scheduled patient for cpe.

## 2023-01-05 ENCOUNTER — Other Ambulatory Visit: Payer: Self-pay | Admitting: Family Medicine

## 2023-01-08 ENCOUNTER — Other Ambulatory Visit: Payer: Self-pay | Admitting: Family Medicine

## 2023-01-08 DIAGNOSIS — G43009 Migraine without aura, not intractable, without status migrainosus: Secondary | ICD-10-CM

## 2023-01-09 MED ORDER — BUTALBITAL-APAP-CAFFEINE 50-325-40 MG PO TABS: 1.0000 | ORAL_TABLET | Freq: Two times a day (BID) | ORAL | 0 refills | Status: DC | PRN

## 2023-01-09 NOTE — Telephone Encounter (Signed)
 ERx

## 2023-01-14 ENCOUNTER — Other Ambulatory Visit: Payer: Self-pay | Admitting: Family Medicine

## 2023-01-14 DIAGNOSIS — E785 Hyperlipidemia, unspecified: Secondary | ICD-10-CM

## 2023-01-17 ENCOUNTER — Other Ambulatory Visit: Payer: Self-pay | Admitting: Family Medicine

## 2023-01-17 DIAGNOSIS — R7303 Prediabetes: Secondary | ICD-10-CM

## 2023-01-17 DIAGNOSIS — G4452 New daily persistent headache (NDPH): Secondary | ICD-10-CM

## 2023-01-17 DIAGNOSIS — E785 Hyperlipidemia, unspecified: Secondary | ICD-10-CM

## 2023-01-17 DIAGNOSIS — E559 Vitamin D deficiency, unspecified: Secondary | ICD-10-CM

## 2023-01-22 ENCOUNTER — Other Ambulatory Visit (INDEPENDENT_AMBULATORY_CARE_PROVIDER_SITE_OTHER): Payer: Managed Care, Other (non HMO)

## 2023-01-22 DIAGNOSIS — G4452 New daily persistent headache (NDPH): Secondary | ICD-10-CM

## 2023-01-22 DIAGNOSIS — E559 Vitamin D deficiency, unspecified: Secondary | ICD-10-CM

## 2023-01-22 DIAGNOSIS — R7303 Prediabetes: Secondary | ICD-10-CM

## 2023-01-22 DIAGNOSIS — E785 Hyperlipidemia, unspecified: Secondary | ICD-10-CM

## 2023-01-22 NOTE — Addendum Note (Signed)
Addended by: Alvina Chou on: 01/22/2023 08:20 AM   Modules accepted: Orders

## 2023-01-23 LAB — COMPREHENSIVE METABOLIC PANEL
ALT: 100 [IU]/L — ABNORMAL HIGH (ref 0–32)
AST: 49 [IU]/L — ABNORMAL HIGH (ref 0–40)
Albumin: 4.2 g/dL (ref 3.8–4.9)
Alkaline Phosphatase: 93 [IU]/L (ref 44–121)
BUN/Creatinine Ratio: 21 (ref 9–23)
BUN: 17 mg/dL (ref 6–24)
Bilirubin Total: <0.2 mg/dL (ref 0.0–1.2)
CO2: 20 mmol/L (ref 20–29)
Calcium: 9.5 mg/dL (ref 8.7–10.2)
Chloride: 108 mmol/L — ABNORMAL HIGH (ref 96–106)
Creatinine, Ser: 0.8 mg/dL (ref 0.57–1.00)
Globulin, Total: 1.9 g/dL (ref 1.5–4.5)
Glucose: 115 mg/dL — ABNORMAL HIGH (ref 70–99)
Potassium: 4.4 mmol/L (ref 3.5–5.2)
Sodium: 142 mmol/L (ref 134–144)
Total Protein: 6.1 g/dL (ref 6.0–8.5)
eGFR: 85 mL/min/{1.73_m2} (ref >59)

## 2023-01-23 LAB — CBC WITH DIFFERENTIAL/PLATELET
Basophils Absolute: 0 10*3/uL (ref 0.0–0.2)
Basos: 0 %
EOS (ABSOLUTE): 0.2 10*3/uL (ref 0.0–0.4)
Eos: 5 %
Hematocrit: 39.4 % (ref 34.0–46.6)
Hemoglobin: 13.1 g/dL (ref 11.1–15.9)
Immature Grans (Abs): 0 10*3/uL (ref 0.0–0.1)
Immature Granulocytes: 0 %
Lymphocytes Absolute: 2.1 10*3/uL (ref 0.7–3.1)
Lymphs: 42 %
MCH: 33 pg (ref 26.6–33.0)
MCHC: 33.2 g/dL (ref 31.5–35.7)
MCV: 99 fL — ABNORMAL HIGH (ref 79–97)
Monocytes Absolute: 0.3 10*3/uL (ref 0.1–0.9)
Monocytes: 7 %
Neutrophils Absolute: 2.3 10*3/uL (ref 1.4–7.0)
Neutrophils: 46 %
Platelets: 239 10*3/uL (ref 150–450)
RBC: 3.97 x10E6/uL (ref 3.77–5.28)
RDW: 12.7 % (ref 11.7–15.4)
WBC: 5 10*3/uL (ref 3.4–10.8)

## 2023-01-23 LAB — HEMOGLOBIN A1C
Est. average glucose Bld gHb Est-mCnc: 123 mg/dL
Hgb A1c MFr Bld: 5.9 % — ABNORMAL HIGH (ref 4.8–5.6)

## 2023-01-23 LAB — LIPID PANEL
Chol/HDL Ratio: 5.2 {ratio} — ABNORMAL HIGH (ref 0.0–4.4)
Cholesterol, Total: 225 mg/dL — ABNORMAL HIGH (ref 100–199)
HDL: 43 mg/dL (ref >39)
LDL Chol Calc (NIH): 79 mg/dL (ref 0–99)
Triglycerides: 649 mg/dL (ref 0–149)
VLDL Cholesterol Cal: 103 mg/dL — ABNORMAL HIGH (ref 5–40)

## 2023-01-23 LAB — TSH: TSH: 1.66 u[IU]/mL (ref 0.450–4.500)

## 2023-01-23 LAB — VITAMIN D 25 HYDROXY (VIT D DEFICIENCY, FRACTURES): Vit D, 25-Hydroxy: 25.3 ng/mL — ABNORMAL LOW (ref 30.0–100.0)

## 2023-01-29 ENCOUNTER — Ambulatory Visit (INDEPENDENT_AMBULATORY_CARE_PROVIDER_SITE_OTHER): Payer: Managed Care, Other (non HMO) | Admitting: Family Medicine

## 2023-01-29 ENCOUNTER — Encounter: Payer: Self-pay | Admitting: Family Medicine

## 2023-01-29 VITALS — BP 124/70 | HR 81 | Temp 98.4°F | Ht 63.5 in | Wt 139.2 lb

## 2023-01-29 DIAGNOSIS — Z23 Encounter for immunization: Secondary | ICD-10-CM | POA: Diagnosis not present

## 2023-01-29 DIAGNOSIS — R21 Rash and other nonspecific skin eruption: Secondary | ICD-10-CM

## 2023-01-29 DIAGNOSIS — Z0000 Encounter for general adult medical examination without abnormal findings: Secondary | ICD-10-CM | POA: Diagnosis not present

## 2023-01-29 DIAGNOSIS — F17200 Nicotine dependence, unspecified, uncomplicated: Secondary | ICD-10-CM

## 2023-01-29 DIAGNOSIS — R7303 Prediabetes: Secondary | ICD-10-CM

## 2023-01-29 DIAGNOSIS — E559 Vitamin D deficiency, unspecified: Secondary | ICD-10-CM

## 2023-01-29 DIAGNOSIS — Z8673 Personal history of transient ischemic attack (TIA), and cerebral infarction without residual deficits: Secondary | ICD-10-CM | POA: Diagnosis not present

## 2023-01-29 DIAGNOSIS — G250 Essential tremor: Secondary | ICD-10-CM

## 2023-01-29 DIAGNOSIS — K219 Gastro-esophageal reflux disease without esophagitis: Secondary | ICD-10-CM

## 2023-01-29 DIAGNOSIS — Z8249 Family history of ischemic heart disease and other diseases of the circulatory system: Secondary | ICD-10-CM

## 2023-01-29 DIAGNOSIS — E785 Hyperlipidemia, unspecified: Secondary | ICD-10-CM

## 2023-01-29 DIAGNOSIS — G43009 Migraine without aura, not intractable, without status migrainosus: Secondary | ICD-10-CM

## 2023-01-29 DIAGNOSIS — F39 Unspecified mood [affective] disorder: Secondary | ICD-10-CM

## 2023-01-29 MED ORDER — PAROXETINE HCL 40 MG PO TABS: 40.0000 mg | ORAL_TABLET | ORAL | 4 refills | Status: DC

## 2023-01-29 MED ORDER — VITAMIN D3 25 MCG (1000 UT) PO CAPS: 2.0000 | ORAL_CAPSULE | Freq: Every day | ORAL | Status: DC

## 2023-01-29 MED ORDER — FENOFIBRATE 145 MG PO TABS: 145.0000 mg | ORAL_TABLET | Freq: Every day | ORAL | 4 refills | Status: AC

## 2023-01-29 MED ORDER — ATORVASTATIN CALCIUM 40 MG PO TABS: 40.0000 mg | ORAL_TABLET | Freq: Every day | ORAL | 4 refills | Status: DC

## 2023-01-29 NOTE — Progress Notes (Unsigned)
Ph: [phone] Fax: [phone]   Patient ID: Kristie Miles, female    DOB: 04/18/1965, 58 y.o.   MRN: [id number]  This visit was conducted in person.  BP 124/70   Pulse 81   Temp 98.4 F (36.9 C) (Oral)   Ht 5' 3.5" (1.613 m)   Wt 139 lb 4 oz (63.2 kg)   SpO2 97%   BMI 24.28 kg/m    CC: CPE Subjective:   HPI: Kristie Miles is a 58 y.o. female presenting on 01/29/2023 for Annual Exam   Mother had burst aneurysm/brain bleed before christmas - with R hemiparesis and cognitive and speech impairment. She also has leukemia. No other fmhx brain aneurysms. Reviewed latest personal MRI 08/2020. Known remote R cerebellar infarct  Chronic headaches/migraines and essential tremor - sees neurology Dr Ardine Bjork Ethelene Browns on topamax 50/100mg  daily as well as propranolol ER 160mg  and primidone 50/25/100mg  daily. No longer on ubrelvy, nurtec (never effective). Abortive treatment - ibuprofen 800mg , rarely fioricet.    EGD 05/2021 - 2cm HH, gastritis - reflux changes on biopsy.   New rash to torso developed over night started on chest after just changing sheets. Itchy rash. Now spreading to abdomen and onto palms of hands.   Ran out of estroven cream a few days ago.  Notes hot flashes have largely resolved.   Preventative: Colonoscopy 05/2021 - 2 HP, rpt 10 yrs (Nandigam)  Well woman exam - sees [facility] Dr Juliene Pina - stopped accepting insurance so needs to find new provider - overdue.  LMP 05/2015.  Mammogram through GYN - needs to get established DEXA scan - not due - smoker to consider at age 19 Lung cancer screening - started 04/2022  Flu shot - yearly COVID shot - Pfizer 03/2019, 04/2019, booster 12/2019 Tetanus shot - due.  Pneumonia shot - not due  Shingrix - discussed - recent outbreak 11/2022  Advanced directive discussion -  Seat belt use discussed Sunscreen use discussed. No changing moles on skin. Sees derm yearly.  Smoking - quit smoking for 6 months with chantix 2023,  restarted. retrial chantix 11/2022 - currently on 0.5mg  daily and down to 3 cig/day.  Alcohol - socially  Dentist - q6 mo Eye exam - yearly   Occ: works from home  Diet: Eats meat, fruits and veggies. Avoids fried food. Drinks water, green tea, and milk. Some wine, 4 total per week. Exercise: No formal exercise right now.      Relevant past medical, surgical, family and social history reviewed and updated as indicated. Interim medical history since our last visit reviewed. Allergies and medications reviewed and updated. Outpatient Medications Prior to Visit  Medication Sig Dispense Refill   aspirin 81 MG EC tablet Take 1 tablet (81 mg total) by mouth daily. Swallow whole.     butalbital-acetaminophen-caffeine (FIORICET) 50-325-40 MG tablet Take 1 tablet by mouth 2 (two) times daily as needed for migraine (sedation precautions). 30 tablet 0   Calcium 200 MG TABS Take by mouth.     Cyanocobalamin (B-12 PO) Take by mouth daily.     esomeprazole (NEXIUM) 40 MG capsule TAKE 1 CAPSULE BY MOUTH DAILY AT 12 NOON 90 capsule 3   estradiol (ESTRACE) 0.1 MG/GM vaginal cream Place 1 Applicatorful vaginally 2 (two) times a week. 42.5 g 3   Flaxseed, Linseed, (FLAX SEEDS PO) Take by mouth.     ibuprofen (ADVIL) 800 MG tablet TAKE 1 TABLET BY MOUTH THREE TIMES A DAY 90 tablet 1   Multiple  Vitamins-Minerals (CENTRUM PO) Take by mouth daily.     Nutritional Supplements (ESTROVEN PO) Take by mouth.     ondansetron (ZOFRAN) 4 MG tablet Take 1 tablet (4 mg total) by mouth every 8 (eight) hours as needed. 20 tablet 3   primidone (MYSOLINE) 50 MG tablet Take 1 tablet in the AM , 1/2 tablet at noon and 2 tablets in the PM 315 tablet 3   propranolol ER (INDERAL LA) 160 MG SR capsule Take 1 capsule (160 mg total) by mouth daily. 90 capsule 3   topiramate (TOPAMAX) 50 MG tablet TAKE 1 TABLET BY MOUTH DAILY AND 2 TABLETS BY MOUTH AT BEDTIME 270 tablet 1   valACYclovir (VALTREX) 1000 MG tablet Take 2 tablets (2,000  mg total) by mouth 2 (two) times daily. As needed for fever blister 20 tablet 3   varenicline (CHANTIX) 0.5 MG tablet Take 1 tablet (0.5 mg total) by mouth 2 (two) times daily. 180 tablet 0   atorvastatin (LIPITOR) 40 MG tablet TAKE 1 TABLET BY MOUTH DAILY 90 tablet 0   Cholecalciferol (VITAMIN D3) 1.25 MG (50000 UT) TABS Take by mouth.     PARoxetine (PAXIL) 40 MG tablet TAKE 1 TABLET(40 MG) BY MOUTH EVERY MORNING 90 tablet 0   Rhubarb (ESTROVEN MENOPAUSE RELIEF) 4 MG TABS Take by mouth. (Patient not taking: Reported on 01/29/2023)     famotidine (PEPCID) tablet 20 mg      No facility-administered medications prior to visit.     Per HPI unless specifically indicated in ROS section below Review of Systems  Constitutional:  Negative for activity change, appetite change, chills, fatigue, fever and unexpected weight change.  HENT:  Negative for hearing loss.   Eyes:  Negative for visual disturbance.  Respiratory:  Negative for cough, chest tightness, shortness of breath and wheezing.   Cardiovascular:  Negative for chest pain, palpitations and leg swelling.  Gastrointestinal:  Negative for abdominal distention, abdominal pain, blood in stool, constipation, diarrhea, nausea and vomiting.  Genitourinary:  Negative for difficulty urinating and hematuria.  Musculoskeletal:  Negative for arthralgias, myalgias and neck pain.  Skin:  Negative for rash.  Neurological:  Positive for tremors and headaches. Negative for dizziness, seizures and syncope.  Hematological:  Negative for adenopathy. Does not bruise/bleed easily.  Psychiatric/Behavioral:  Negative for dysphoric mood. The patient is not nervous/anxious.     Objective:  BP 124/70   Pulse 81   Temp 98.4 F (36.9 C) (Oral)   Ht 5' 3.5" (1.613 m)   Wt 139 lb 4 oz (63.2 kg)   SpO2 97%   BMI 24.28 kg/m   Wt Readings from Last 3 Encounters:  01/29/23 139 lb 4 oz (63.2 kg)  09/06/22 135 lb (61.2 kg)  02/13/22 142 lb 6.4 oz (64.6 kg)       Physical Exam Vitals and nursing note reviewed.  Constitutional:      Appearance: Normal appearance. She is not ill-appearing.  HENT:     Head: Normocephalic and atraumatic.     Right Ear: Tympanic membrane, ear canal and external ear normal. There is no impacted cerumen.     Left Ear: Tympanic membrane, ear canal and external ear normal. There is no impacted cerumen.     Mouth/Throat:     Pharynx: Oropharynx is clear. No oropharyngeal exudate or posterior oropharyngeal erythema.  Eyes:     General:        Right eye: No discharge.        Left eye:  No discharge.     Extraocular Movements: Extraocular movements intact.     Conjunctiva/sclera: Conjunctivae normal.     Pupils: Pupils are equal, round, and reactive to light.  Neck:     Thyroid: No thyroid mass or thyromegaly.  Cardiovascular:     Rate and Rhythm: Normal rate and regular rhythm.     Pulses: Normal pulses.     Heart sounds: Normal heart sounds. No murmur heard. Pulmonary:     Effort: Pulmonary effort is normal. No respiratory distress.     Breath sounds: Normal breath sounds. No wheezing, rhonchi or rales.  Abdominal:     General: Bowel sounds are normal. There is no distension.     Palpations: Abdomen is soft. There is no mass.     Tenderness: There is no abdominal tenderness. There is no guarding or rebound.     Hernia: No hernia is present.  Musculoskeletal:     Cervical back: Normal range of motion and neck supple. No rigidity.     Right lower leg: No edema.     Left lower leg: No edema.  Lymphadenopathy:     Cervical: No cervical adenopathy.  Skin:    General: Skin is warm and dry.     Findings: Rash present.     Comments: Pruritic tiny papular rash with surrounding erythema predominantly to chest and abdomen into upper extremities, spares back and lower extremity, few non-pruritic macular spots to palms, blanching  Neurological:     General: No focal deficit present.     Mental Status: She is alert. Mental  status is at baseline.  Psychiatric:        Mood and Affect: Mood normal.        Behavior: Behavior normal.       Results for orders placed or performed in visit on 01/22/23  Lipid panel   Collection Time: 01/22/23  8:20 AM  Result Value Ref Range   Cholesterol, Total 225 (H) 100 - 199 mg/dL   Triglycerides 782 (HH) 0 - 149 mg/dL   HDL 43 >95 mg/dL   VLDL Cholesterol Cal 103 (H) 5 - 40 mg/dL   LDL Chol Calc (NIH) 79 0 - 99 mg/dL   Chol/HDL Ratio 5.2 (H) 0.0 - 4.4 ratio  Hemoglobin A1c   Collection Time: 01/22/23  8:20 AM  Result Value Ref Range   Hgb A1c MFr Bld 5.9 (H) 4.8 - 5.6 %   Est. average glucose Bld gHb Est-mCnc 123 mg/dL  CBC with Differential/Platelet   Collection Time: 01/22/23  8:20 AM  Result Value Ref Range   WBC 5.0 3.4 - 10.8 x10E3/uL   RBC 3.97 3.77 - 5.28 x10E6/uL   Hemoglobin 13.1 11.1 - 15.9 g/dL   Hematocrit 62.1 30.8 - 46.6 %   MCV 99 (H) 79 - 97 fL   MCH 33.0 26.6 - 33.0 pg   MCHC 33.2 31.5 - 35.7 g/dL   RDW 65.7 84.6 - 96.2 %   Platelets 239 150 - 450 x10E3/uL   Neutrophils 46 Not Estab. %   Lymphs 42 Not Estab. %   Monocytes 7 Not Estab. %   Eos 5 Not Estab. %   Basos 0 Not Estab. %   Neutrophils Absolute 2.3 1.4 - 7.0 x10E3/uL   Lymphocytes Absolute 2.1 0.7 - 3.1 x10E3/uL   Monocytes Absolute 0.3 0.1 - 0.9 x10E3/uL   EOS (ABSOLUTE) 0.2 0.0 - 0.4 x10E3/uL   Basophils Absolute 0.0 0.0 - 0.2 x10E3/uL   Immature  Granulocytes 0 Not Estab. %   Immature Grans (Abs) 0.0 0.0 - 0.1 x10E3/uL  TSH   Collection Time: 01/22/23  8:20 AM  Result Value Ref Range   TSH 1.660 0.450 - 4.500 uIU/mL  Comprehensive metabolic panel   Collection Time: 01/22/23  8:20 AM  Result Value Ref Range   Glucose 115 (H) 70 - 99 mg/dL   BUN 17 6 - 24 mg/dL   Creatinine, Ser 4.09 0.57 - 1.00 mg/dL   eGFR 85 >81 XB/JYN/8.29   BUN/Creatinine Ratio 21 9 - 23   Sodium 142 134 - 144 mmol/L   Potassium 4.4 3.5 - 5.2 mmol/L   Chloride 108 (H) 96 - 106 mmol/L   CO2 20 20 -  29 mmol/L   Calcium 9.5 8.7 - 10.2 mg/dL   Total Protein 6.1 6.0 - 8.5 g/dL   Albumin 4.2 3.8 - 4.9 g/dL   Globulin, Total 1.9 1.5 - 4.5 g/dL   Bilirubin Total <5.6 0.0 - 1.2 mg/dL   Alkaline Phosphatase 93 44 - 121 IU/L   AST 49 (H) 0 - 40 IU/L   ALT 100 (H) 0 - 32 IU/L  VITAMIN D 25 Hydroxy (Vit-D Deficiency, Fractures)   Collection Time: 01/22/23  8:20 AM  Result Value Ref Range   Vit D, 25-Hydroxy 25.3 (L) 30.0 - 100.0 ng/mL    Assessment & Plan:   Problem List Items Addressed This Visit     Health maintenance examination - Primary (Chronic)   Preventative protocols reviewed and updated unless pt declined. Discussed healthy diet and lifestyle.       History of CVA (cerebrovascular accident)   Continue aspirin, statin, avoiding triptans      GERD (gastroesophageal reflux disease)   Chronic, stable period with daily nexium, not using pepcid      Smoker   Continue chantix in an effort to fully quit  Continue lung cancer screening program.       Dyslipidemia   Relevant Medications   atorvastatin (LIPITOR) 40 MG tablet   fenofibrate (TRICOR) 145 MG tablet   Other Relevant Orders   Lipid panel   Comprehensive metabolic panel   Other Visit Diagnoses       Encounter for immunization       Relevant Orders   Flu vaccine trivalent PF, 6mos and older(Flulaval,Afluria,Fluarix,Fluzone) (Completed)        Meds ordered this encounter  Medications   atorvastatin (LIPITOR) 40 MG tablet    Sig: Take 1 tablet (40 mg total) by mouth daily.    Dispense:  90 tablet    Refill:  4   PARoxetine (PAXIL) 40 MG tablet    Sig: Take 1 tablet (40 mg total) by mouth every morning.    Dispense:  90 tablet    Refill:  4   Cholecalciferol (VITAMIN D3) 25 MCG (1000 UT) CAPS    Sig: Take 2 capsules (2,000 Units total) by mouth daily.   fenofibrate (TRICOR) 145 MG tablet    Sig: Take 1 tablet (145 mg total) by mouth daily.    Dispense:  90 tablet    Refill:  4    In addition to  atorvastatin    Orders Placed This Encounter  Procedures   Flu vaccine trivalent PF, 6mos and older(Flulaval,Afluria,Fluarix,Fluzone)   Lipid panel    Standing Status:   Future    Expiration Date:   01/29/2024   Comprehensive metabolic panel    Standing Status:   Future    Expiration  Date:   01/29/2024    Patient Instructions  Flu shot today Good to see you today Start fenofibrate daily sent to pharmacy Continue atorvastatin and paxil  Return as needed or in 1 year for next physical   Follow up plan: Return in about 1 year (around 01/29/2024) for annual exam, prior fasting for blood work.  Eustaquio Boyden, MD

## 2023-01-29 NOTE — Patient Instructions (Signed)
Flu shot today Good to see you today Start fenofibrate daily sent to pharmacy Continue atorvastatin and paxil  Return as needed or in 1 year for next physical

## 2023-01-29 NOTE — Assessment & Plan Note (Signed)
Chronic, stable period with daily nexium, not using pepcid

## 2023-01-29 NOTE — Assessment & Plan Note (Signed)
Continue chantix in an effort to fully quit  Continue lung cancer screening program.

## 2023-01-29 NOTE — Assessment & Plan Note (Signed)
Preventative protocols reviewed and updated unless pt declined. Discussed healthy diet and lifestyle.

## 2023-01-29 NOTE — Assessment & Plan Note (Signed)
Continue aspirin, statin, avoiding triptans

## 2023-01-30 ENCOUNTER — Telehealth: Payer: Self-pay | Admitting: Family Medicine

## 2023-01-30 DIAGNOSIS — Z8249 Family history of ischemic heart disease and other diseases of the circulatory system: Secondary | ICD-10-CM | POA: Insufficient documentation

## 2023-01-30 NOTE — Assessment & Plan Note (Signed)
Continue to encourage limiting added sugar in diet.

## 2023-01-30 NOTE — Assessment & Plan Note (Signed)
Chronic, LDL controlled on atorvastatin however triglycerides remain markedly elevated. For this reason will start fenofibrate 145mg  daily. Rpt labs in 3-4 months to reassess control.  The ASCVD Risk score (Arnett DK, et al., 2019) failed to calculate for the following reasons:   Risk score cannot be calculated because patient has a medical history suggesting prior/existing ASCVD

## 2023-01-30 NOTE — Assessment & Plan Note (Signed)
Followed by neurology on topamax preventatively and ibuprofen/fioricet abortively.

## 2023-01-30 NOTE — Assessment & Plan Note (Signed)
Rec increase replacement dose to 2000 international units  daily.

## 2023-01-30 NOTE — Telephone Encounter (Signed)
Please schedule fasting lab visit only in 4 months to repeat cholesterol levels. Labs ordered.

## 2023-01-30 NOTE — Assessment & Plan Note (Signed)
Followed by neurology on primidone and propranolol

## 2023-01-30 NOTE — Telephone Encounter (Signed)
Spoke to pt, scheduled labs for 05/31/23

## 2023-01-30 NOTE — Assessment & Plan Note (Signed)
Refill paroxetine 40mg  daily which is effective.

## 2023-02-01 ENCOUNTER — Other Ambulatory Visit: Payer: Self-pay | Admitting: Adult Health

## 2023-02-01 ENCOUNTER — Encounter: Payer: Self-pay | Admitting: Family Medicine

## 2023-02-01 DIAGNOSIS — R21 Rash and other nonspecific skin eruption: Secondary | ICD-10-CM | POA: Insufficient documentation

## 2023-02-01 MED ORDER — PREDNISONE 20 MG PO TABS: ORAL_TABLET | ORAL | 0 refills | Status: DC

## 2023-02-01 NOTE — Assessment & Plan Note (Addendum)
Intensely pruritic rash started in chest spreading into abdomen.  No new meds/supplements, foods. No similar rash in family members.  Started after changing bed sheets - rec wash all bedding/clothing with hot water, may use benadryl prn. Update if not improving.   ADDENDUM ==>see mychart message. Rash worsening despite above. Will Rx prednisone course, consider treating for scabies.

## 2023-02-15 NOTE — Telephone Encounter (Signed)
Spoke with pt offering 4:00 OV tomorrow with Dr Reece Agar. Pt agrees.   Will have pt added to schedule.

## 2023-02-15 NOTE — Telephone Encounter (Signed)
Please schedule OV for further evaluation - would offer 4pm tomorrow Friday with me.

## 2023-02-15 NOTE — Telephone Encounter (Signed)
Notified that 3:00 spot opened up.   Spoke with pt asking offering 3:00 OV. Pt agrees, stating that works better for her.   Appt changed.

## 2023-02-16 ENCOUNTER — Ambulatory Visit: Payer: Managed Care, Other (non HMO) | Admitting: Family Medicine

## 2023-02-19 ENCOUNTER — Ambulatory Visit: Payer: Self-pay | Admitting: Family Medicine

## 2023-02-19 ENCOUNTER — Ambulatory Visit: Payer: Managed Care, Other (non HMO) | Admitting: Internal Medicine

## 2023-02-19 ENCOUNTER — Encounter: Payer: Self-pay | Admitting: Internal Medicine

## 2023-02-19 VITALS — BP 110/70 | HR 60 | Temp 98.9°F | Ht 63.5 in | Wt 140.0 lb

## 2023-02-19 DIAGNOSIS — L42 Pityriasis rosea: Secondary | ICD-10-CM | POA: Insufficient documentation

## 2023-02-19 MED ORDER — TRIAMCINOLONE ACETONIDE 0.1 % EX CREA: 1.0000 | TOPICAL_CREAM | Freq: Two times a day (BID) | CUTANEOUS | 1 refills | Status: AC | PRN

## 2023-02-19 MED ORDER — PREDNISONE 20 MG PO TABS: 40.0000 mg | ORAL_TABLET | Freq: Every day | ORAL | 0 refills | Status: DC

## 2023-02-19 NOTE — Progress Notes (Signed)
 Subjective:    Patient ID: Kristie Miles, female    DOB: 1965-04-18, 58 y.o.   MRN: [id number]  HPI Here due to recurrence of rash Seen by Dr Reece Agar on 1/27, but then worsened so he sent Rx for prednisone  Had started just before that--started on her chest and was very itchy Had spread to entire torso and arms--also down legs Got prednisone for 5 days---noted immediate relief that worsened upon the final lowest dose of prednisone Then worsened quickly  Very itchy  Benedryl at night cortaid  Current Outpatient Medications on File Prior to Visit  Medication Sig Dispense Refill   aspirin 81 MG EC tablet Take 1 tablet (81 mg total) by mouth daily. Swallow whole.     atorvastatin (LIPITOR) 40 MG tablet Take 1 tablet (40 mg total) by mouth daily. 90 tablet 4   butalbital-acetaminophen-caffeine (FIORICET) 50-325-40 MG tablet Take 1 tablet by mouth 2 (two) times daily as needed for migraine (sedation precautions). 30 tablet 0   Calcium 200 MG TABS Take by mouth.     Cholecalciferol (VITAMIN D3) 25 MCG (1000 UT) CAPS Take 2 capsules (2,000 Units total) by mouth daily.     Cyanocobalamin (B-12 PO) Take by mouth daily.     esomeprazole (NEXIUM) 40 MG capsule TAKE 1 CAPSULE BY MOUTH DAILY AT 12 NOON 90 capsule 3   estradiol (ESTRACE) 0.1 MG/GM vaginal cream Place 1 Applicatorful vaginally 2 (two) times a week. 42.5 g 3   fenofibrate (TRICOR) 145 MG tablet Take 1 tablet (145 mg total) by mouth daily. 90 tablet 4   Flaxseed, Linseed, (FLAX SEEDS PO) Take by mouth.     ibuprofen (ADVIL) 800 MG tablet TAKE 1 TABLET BY MOUTH THREE TIMES A DAY 90 tablet 1   Multiple Vitamins-Minerals (CENTRUM PO) Take by mouth daily.     Nutritional Supplements (ESTROVEN PO) Take by mouth.     ondansetron (ZOFRAN) 4 MG tablet Take 1 tablet (4 mg total) by mouth every 8 (eight) hours as needed. 20 tablet 3   PARoxetine (PAXIL) 40 MG tablet Take 1 tablet (40 mg total) by mouth every morning. 90 tablet 4   primidone  (MYSOLINE) 50 MG tablet Take 1 tablet in the AM , 1/2 tablet at noon and 2 tablets in the PM 315 tablet 3   propranolol ER (INDERAL LA) 160 MG SR capsule TAKE 1 CAPSULE BY MOUTH DAILY 90 capsule 0   Rhubarb (ESTROVEN MENOPAUSE RELIEF) 4 MG TABS Take by mouth.     topiramate (TOPAMAX) 50 MG tablet TAKE 1 TABLET BY MOUTH DAILY AND 2 TABLETS BY MOUTH AT BEDTIME 270 tablet 1   valACYclovir (VALTREX) 1000 MG tablet Take 2 tablets (2,000 mg total) by mouth 2 (two) times daily. As needed for fever blister 20 tablet 3   varenicline (CHANTIX) 0.5 MG tablet Take 1 tablet (0.5 mg total) by mouth 2 (two) times daily. 180 tablet 0   No current facility-administered medications on file prior to visit.    Allergies  Allergen Reactions   Latex Itching   Sulfa Antibiotics Rash    Past Medical History:  Diagnosis Date   Allergy    Anxiety    Arthritis    Asthma    GERD (gastroesophageal reflux disease)    Hyperlipidemia    Mini stroke    Morton's neuroma 10/2020   followed by podiatry   Smoking    Stroke [facility])    Mini CVA - 2012    Past  Surgical History:  Procedure Laterality Date   BONE SPUR Bilateral    feet   NEURECTOMY FOOT Right 11/04/2020   [facility]orton's neurectomy [facility])   PLANTAR FASCIA SURGERY Bilateral     Family History  Problem Relation Age of Onset   Arthritis Mother    Hyperlipidemia Mother    Hypertension Mother    Tremor Mother    Atrial fibrillation Mother        pacemaker   Leukemia Mother    Headache Mother    Valvular heart disease Mother    Cerebral aneurysm Mother 95       with brain bleed   Asthma Father    Kidney cancer Father    Kidney disease Father 92   Migraines Father    Crohn's disease Sister    Alcohol abuse Maternal Grandmother    Arthritis Maternal Grandmother    Heart disease Maternal Grandfather    Alcohol abuse Maternal Grandfather    Arthritis Paternal Grandmother    Asthma Paternal Grandmother    Headache Paternal Grandmother    Asthma  Daughter    Tremor Daughter    Migraines Nephew    Colon cancer Neg Hx    Esophageal cancer Neg Hx    Rectal cancer Neg Hx    Stomach cancer Neg Hx    Stroke Neg Hx     Social History   Socioeconomic History   Marital status: Married    Spouse name: Not on file   Number of children: 1   Years of education: Not on file   Highest education level: Not on file  Occupational History   Occupation: Designer, television/film set: [employer]  Tobacco Use   Smoking status: Every Day    Current packs/day: 0.00    Average packs/day: 0.5 packs/day for 41.0 years (20.5 ttl pk-yrs)    Types: Cigarettes    Start date: 08/02/1980    Last attempt to quit: 08/02/2021    Years since quitting: 1.5   Smokeless tobacco: Never   Tobacco comments:    less then 1/2 a pack a day.  prev able to tolerate chantix  Vaping Use   Vaping status: Never Used  Substance and Sexual Activity   Alcohol use: Yes    Alcohol/week: 7.0 standard drinks of alcohol    Types: 7 Glasses of wine per week    Comment: social drinker   Drug use: No   Sexual activity: Not on file  Other Topics Concern   Not on file  Social History Narrative   Not on file   Social Drivers of Health   Financial Resource Strain: Not on file  Food Insecurity: Not on file  Transportation Needs: Not on file  Physical Activity: Not on file  Stress: Not on file  Social Connections: Not on file  Intimate Partner Violence: Not on file   Review of Systems No fever No new medications No exposures that she knows of--actually changed back to prior shampoo, just in case    Objective:   Physical Exam Constitutional:      Appearance: Normal appearance.  Skin:    Comments: Widespread raised papules mostly on trunk No redness or inflammation  Neurological:     Mental Status: She is alert.            Assessment & Plan:

## 2023-02-19 NOTE — Assessment & Plan Note (Signed)
 Rash looks most like this--though no preceding infection Will give triamcinolone cream and starting cetitrizine for the itching Benedryl at night Had great response to prednisone--so will refill this (though discussed that this is not routine for this condition that will go away on her own)

## 2023-02-19 NOTE — Telephone Encounter (Signed)
Okay ?I will assess her at the visit ?

## 2023-02-19 NOTE — Telephone Encounter (Signed)
 Copied from CRM [id number]. Topic: Clinical - Red Word Triage >> Feb 19, 2023  8:36 AM Larwance Sachs wrote: Red Word that prompted transfer to Nurse Triage: Patient called in regarding a rash from head to toe that is causing a terrible itch and pain, stated receiving prednisone for treatment and after finishing medication rash returned worse than at first.  Chief Complaint: flesh colored tiny bumps; rash Symptoms: rash to entire body, itchy Frequency: constant Pertinent Negatives: Patient denies fever, sob, cp,  Disposition: [] ED /[] Urgent Care (no appt availability in office) / [x] Appointment(In office/virtual)/ []  [facility]/ [] Home Care/ [] Refused Recommended Disposition /[] [facility]/ []  Follow-up with PCP Additional Notes: apt made for today; care advice given, denies questions, instructed to go to er if becomes worse.   Reason for Disposition  SEVERE itching (i.e., interferes with sleep, normal activities or school)  Answer Assessment - Initial Assessment Questions 1.) CALLER DIAGNOSIS: "What do you think is causing the rash?" (e.g., athlete's foot, chickenpox, hives, impetigo) unknown  2.) LOCALIZED OR WIDESPREAD:  "Is the rash all over (widespread) or mostly just in one area of the body (localized)?"  All over body  3.) NEW MEDICINES: "Are you taking any new medicine?"  Was on prednisone and it helped but when she was finished it came back worse.  Very itchy.   4.) APPEARANCE of RASH: "Describe the rash. What color is it?" (Note: It is difficult to assess rash color in people with darker-colored skin. When this situation occurs, simply ask the caller to describe what they see.) You can feel it more than you can see it.  Tiny bumps, flesh colored.  Answer Assessment - Initial Assessment Questions 1. APPEARANCE of RASH: "Describe the rash." (e.g., spots, blisters, raised areas, skin peeling, scaly)     Tiny flesh colored bumps 2. SIZE: "How big are the spots?"  (e.g., tip of pen, eraser, coin; inches, centimeters)     tiny 3. LOCATION: "Where is the rash located?"     All over 4. COLOR: "What color is the rash?" (Note: It is difficult to assess rash color in people with darker-colored skin. When this situation occurs, simply ask the caller to describe what they see.)     Flesh colored 5. ONSET: "When did the rash begin?"     5 days ago 6. FEVER: "Do you have a fever?" If Yes, ask: "What is your temperature, how was it measured, and when did it start?"     denies 7. ITCHING: "Does the rash itch?" If Yes, ask: "How bad is the itch?" (Scale 1-10; or mild, moderate, severe)     10 8. CAUSE: "What do you think is causing the rash?"     unknown 9. MEDICINE FACTORS: "Have you started any new medicines within the last 2 weeks?" (e.g., antibiotics)      na 10. OTHER SYMPTOMS: "Do you have any other symptoms?" (e.g., dizziness, headache, sore throat, joint pain)       denies 11. PREGNANCY: "Is there any chance you are pregnant?" "When was your last menstrual period?"       na  Protocols used: Rash - Guideline Selection-A-AH, Rash or Redness - [facility]

## 2023-03-17 ENCOUNTER — Other Ambulatory Visit: Payer: Self-pay | Admitting: Family Medicine

## 2023-03-17 DIAGNOSIS — G43009 Migraine without aura, not intractable, without status migrainosus: Secondary | ICD-10-CM

## 2023-03-20 NOTE — Telephone Encounter (Signed)
 Topiramate Last filled:  01/21/23, #270 Last OV:  01/29/23, CPE Next OV:  02/01/24, CPE

## 2023-03-23 ENCOUNTER — Other Ambulatory Visit: Payer: Self-pay | Admitting: Family Medicine

## 2023-03-23 DIAGNOSIS — G43009 Migraine without aura, not intractable, without status migrainosus: Secondary | ICD-10-CM

## 2023-03-23 MED ORDER — BUTALBITAL-APAP-CAFFEINE 50-325-40 MG PO TABS: 1.0000 | ORAL_TABLET | Freq: Two times a day (BID) | ORAL | 0 refills | Status: DC | PRN

## 2023-03-23 NOTE — Telephone Encounter (Signed)
 Fioricet Last filled:  01/09/23, #30 Last OV:  01/29/23, CPE Next OV:  02/01/24, CPE

## 2023-03-23 NOTE — Telephone Encounter (Signed)
 ERx

## 2023-04-05 ENCOUNTER — Other Ambulatory Visit: Payer: Self-pay | Admitting: Family Medicine

## 2023-04-05 NOTE — Telephone Encounter (Signed)
 ERx

## 2023-04-09 ENCOUNTER — Other Ambulatory Visit: Payer: Self-pay | Admitting: Adult Health

## 2023-04-09 ENCOUNTER — Other Ambulatory Visit: Payer: Self-pay | Admitting: Family Medicine

## 2023-04-16 ENCOUNTER — Encounter: Payer: Self-pay | Admitting: Family Medicine

## 2023-04-19 MED ORDER — ESTRADIOL 0.1 MG/GM VA CREA: 1.0000 | TOPICAL_CREAM | VAGINAL | 3 refills | Status: DC

## 2023-04-19 NOTE — Telephone Encounter (Signed)
 Spoke with [facility] asking about estradiol rx. States it's ready for pt to pick up.   Will inform pt via MyChart.

## 2023-04-19 NOTE — Telephone Encounter (Signed)
 Can we check with pharmacy to see what the issue is?  She is being told it is too soon to fill.

## 2023-05-01 ENCOUNTER — Other Ambulatory Visit: Payer: Self-pay | Admitting: Acute Care

## 2023-05-01 DIAGNOSIS — Z87891 Personal history of nicotine dependence: Secondary | ICD-10-CM

## 2023-05-01 DIAGNOSIS — F17210 Nicotine dependence, cigarettes, uncomplicated: Secondary | ICD-10-CM

## 2023-05-01 DIAGNOSIS — Z122 Encounter for screening for malignant neoplasm of respiratory organs: Secondary | ICD-10-CM

## 2023-05-02 ENCOUNTER — Other Ambulatory Visit: Payer: Self-pay | Admitting: Family Medicine

## 2023-05-02 DIAGNOSIS — Z1231 Encounter for screening mammogram for malignant neoplasm of breast: Secondary | ICD-10-CM

## 2023-05-04 ENCOUNTER — Inpatient Hospital Stay: Admission: RE | Admit: 2023-05-04 | Source: Ambulatory Visit

## 2023-05-10 ENCOUNTER — Encounter: Payer: Self-pay | Admitting: Adult Health

## 2023-05-10 ENCOUNTER — Ambulatory Visit: Payer: Managed Care, Other (non HMO) | Admitting: Adult Health

## 2023-05-10 VITALS — BP 118/74 | HR 73 | Ht 65.0 in | Wt 137.2 lb

## 2023-05-10 DIAGNOSIS — G250 Essential tremor: Secondary | ICD-10-CM | POA: Diagnosis not present

## 2023-05-10 DIAGNOSIS — G43019 Migraine without aura, intractable, without status migrainosus: Secondary | ICD-10-CM | POA: Diagnosis not present

## 2023-05-10 NOTE — Progress Notes (Signed)
 PATIENT: Kristie Miles DOB: 03/07/1965  REASON FOR VISIT: follow up HISTORY FROM: patient  Chief Complaint  Patient presents with   Room 19    Pt is here with her Daughter. Pt states that she thinks that her tremors have been a little worse in her head and mouth. Pt states that her tremors in her hands are bad and some days she has to have her husband cut up her food, other days she is okay, but for the most part if she is over exerted her tremors are more prominent. Pt states that she would like to discuss different treatment options. Pt states that she thinks that her migraines are due to stress. Pt has nausea with them recently.      HISTORY OF PRESENT ILLNESS: Today 05/10/23:  Kristie Miles is a 58 y.o. female with a history of essential tremor and migraine headaches. Returns today for follow-up.    Tremor: Feels that her tremors are worse.  She has good days and bad days. Has trouble with handwriting. Worse days husband will have to cut up food.  If she is really tired tremors seems to be worse. Stress also increases tremors.  Tremor noted in the hands neck and mouth.  Remains on Mysoline  50 mg in the morning and 100 mg at bedtime.  Never tried the 25 mg tablet at lunch as discussed at the last visit.  Migraines: Reports that lately she has been having more migraines that she relates to increased stress due to family issues.  She also notices that she clenches her jaw and finds that may be contributing to her headaches.  Weather also seems to be her trigger.  Headaches are always located on the left side.  Reports photophobia and phonophobia.  Reports nausea but no vomiting.  No numbness or tingling or strokelike symptoms associated.  Denies visual aura.  Will use Fioricet for bad headaches.  She does take over-the-counter ibuprofen  at least every other day for her migraines.  She is currently on Topamax  50 mg in the morning and 100 mg at bedtime.   09/06/22: Senai Lebovits is a 58 y.o.  female with a history of essential tremor and migraine headaches. Returns today for follow-up.   Tremor: Currently on primidone  50 mg ain the AM and 100 mg at bedtime and propranolol  extended release 160 mg daily.  Feels that tremor in head and mouth are worse. Feels that medication increased helped initially but has worn off.    Migraines: Remains on Topamax  50 mg in the morning and 100 mg at bedtime. Headaches are daily. Has been taking ibuprofen  daily. Tried ubrelvy  and nurtec in the past. Has Fioricet but doesn't use it often  (copied from Dr. Dail Drought note), Kristie Miles is a 58 year old right-handed woman with an underlying medical history of reflux disease, history of TIA, cerebellar stroke, hyperlipidemia, smoking, depression, anxiety, arthritis, allergies, and borderline overweight state, who reports a longstanding history of migraine headaches, since she has been in college.  She has a strong family history of migraines as well, her father has migraines, paternal grandmother had migraines and her own mom had cluster headaches and ocular migraines.  Her daughter has migraines as well and a nephew.  She is currently on Topamax , has not quite increased it to 50 mg twice daily but is supposed to increase it.  Nurtec has not helped, she has not tried Ubrelvy  yet.  She has been using Fioricet sparingly which helps.  She has tried  and failed nortriptyline , propranolol , has been on Topamax , and has been on Paxil , no longer takes Robaxin  which was for back pain.   She has not been on a monthly injectable such as Aimovig or Ajovy or Emgality, she has not been on Botox but would be interested in pursuing Botox injections, her nephew gets them.   She is on propranolol  for tremors.  She also takes Mysoline  for tremor control.     She had a brain MRI without contrast on 08/31/2020 and I reviewed the results:   IMPRESSION: 1. No evidence of acute intracranial abnormality 2. 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 3. Moderate paranasal sinus mucosal.   She has not noticed any obvious triggers.  In the past 6 months she has had nearly daily headaches.  She drinks caffeine  in the form of green tea, typically 1 cup/day.  She quit smoking about a month ago.  She is off Chantix  now.  She drinks alcohol occasionally.  She sleeps well, tries to get about 7 to 8 hours of sleep.  She has some snoring but no apneas reported per husband.  She wakes up well rested.  She does not have any visual auras for her migraines, no history of stroke symptoms but was found to have a lacunar stroke.  She has had no associated one-sided weakness or numbness or tingling or droopy face or slurring of speech.  With her migraine she has often left-sided symptoms with throbbing headache, sometimes it is a constant pressure-like pain.  She has associated nausea and light sensitivity typically.  She is due for her eye examination.  She has prescription eyeglasses and feels it may need to be updated    2022: Kristie Miles is a 58 year old female with a history of essential tremor.  She returns today for follow-up.  She is currently on primidone  and propranolol .  She states that these medications are working well for her.  She states that her stress level has also improved.  She denies any new symptoms.  She returns today for an evaluation.  09/24/19: Kristie Miles is a 58 year old female with a history of essential tremor.  She returns today for follow-up.  Overall she feels that she is doing well on primidone  and propranolol .  She did stop smoking 6 weeks ago using Chantix .  She stopped Chantix  approximately 1 week ago.  She feels that her tremors have improved since she stopped smoking.  Subsequently she states that her headaches have gotten worse.  This is in the past been managed by PCP.  At the last visit she reported that she was recently started on nortriptyline  however she does not recall why  she is no longer taking this.  She returns today for an evaluation.  HISTORY 03/10/19:   Kristie Miles is a 58 year old female with a history of essential tremor.  She returns today for follow-up.  She continues on primidone  and propranolol .  She feels that the combination of these medications have been beneficial.  She states that she notices the tremor in both upper extremities.  She notices that with her handwriting and holding a glass.  She also states that she cannot pain her fingernails.  She also notes that she has migraine headaches.  This is currently being managed by her primary care provider.  Notes that she has had a daily headache for quite some time.  Was recently started on nortriptyline  10 mg at bedtime.  Also states that she  typically takes Tylenol  or ibuprofen  on a daily basis.  REVIEW OF SYSTEMS: Out of a complete 14 system review of symptoms, the patient complains only of the following symptoms, and all other reviewed systems are negative.  See HPI  ALLERGIES: Allergies  Allergen Reactions   Latex Itching   Sulfa Antibiotics Rash    HOME MEDICATIONS: Outpatient Medications Prior to Visit  Medication Sig Dispense Refill   aspirin  81 MG EC tablet Take 1 tablet (81 mg total) by mouth daily. Swallow whole.     atorvastatin  (LIPITOR) 40 MG tablet Take 1 tablet (40 mg total) by mouth daily. 90 tablet 4   butalbital -acetaminophen -caffeine  (FIORICET) 50-325-40 MG tablet Take 1 tablet by mouth 2 (two) times daily as needed for migraine (sedation precautions). 30 tablet 0   Calcium  200 MG TABS Take by mouth.     Cholecalciferol (VITAMIN D3) 25 MCG (1000 UT) CAPS Take 2 capsules (2,000 Units total) by mouth daily.     esomeprazole  (NEXIUM ) 40 MG capsule TAKE 1 CAPSULE BY MOUTH DAILY AT 12 NOON 90 capsule 3   estradiol  (ESTRACE ) 0.1 MG/GM vaginal cream Place 1 Applicatorful vaginally 2 (two) times a week. 42.5 g 3   fenofibrate  (TRICOR ) 145 MG tablet Take 1 tablet (145 mg total) by  mouth daily. 90 tablet 4   Flaxseed, Linseed, (FLAX SEEDS PO) Take by mouth.     ibuprofen  (ADVIL ) 800 MG tablet TAKE 1 TABLET BY MOUTH THREE TIMES A DAY 90 tablet 1   Multiple Vitamins-Minerals (CENTRUM PO) Take by mouth daily.     Nutritional Supplements (ESTROVEN PO) Take by mouth.     ondansetron  (ZOFRAN ) 4 MG tablet Take 1 tablet (4 mg total) by mouth every 8 (eight) hours as needed. 20 tablet 3   PARoxetine  (PAXIL ) 40 MG tablet TAKE 1 TABLET(40 MG) BY MOUTH EVERY MORNING 90 tablet 3   primidone  (MYSOLINE ) 50 MG tablet Take 1 tablet in the AM , 1/2 tablet at noon and 2 tablets in the PM 315 tablet 3   propranolol  ER (INDERAL  LA) 160 MG SR capsule TAKE 1 CAPSULE BY MOUTH DAILY 90 capsule 3   topiramate  (TOPAMAX ) 50 MG tablet TAKE 1 TABLET BY MOUTH DAILY AND 2 TABLETS BY MOUTH AT BEDTIME 270 tablet 3   valACYclovir  (VALTREX ) 1000 MG tablet Take 2 tablets (2,000 mg total) by mouth 2 (two) times daily. As needed for fever blister 20 tablet 3   varenicline  (CHANTIX ) 0.5 MG tablet Take 1 tablet (0.5 mg total) by mouth 2 (two) times daily. 180 tablet 0   Cyanocobalamin (B-12 PO) Take by mouth daily. (Patient not taking: Reported on 05/10/2023)     predniSONE  (DELTASONE ) 20 MG tablet Take 2 tablets (40 mg total) by mouth daily. For 5 days, then 1 tab daily for 5 days 15 tablet 0   Rhubarb (ESTROVEN MENOPAUSE RELIEF) 4 MG TABS Take by mouth. (Patient not taking: Reported on 05/10/2023)     triamcinolone  cream (KENALOG ) 0.1 % Apply 1 Application topically 2 (two) times daily as needed. (Patient not taking: Reported on 05/10/2023) 45 g 1   No facility-administered medications prior to visit.    PAST MEDICAL HISTORY: Past Medical History:  Diagnosis Date   Allergy    Anxiety    Arthritis    Asthma    GERD (gastroesophageal reflux disease)    Hyperlipidemia    Mini stroke    Morton's neuroma 10/2020   followed by podiatry   Smoking    Stroke (  HCC)    Mini CVA - 2012    PAST SURGICAL  HISTORY: Past Surgical History:  Procedure Laterality Date   BONE SPUR Bilateral    feet   NEURECTOMY FOOT Right 11/04/2020   Morton's neurectomy [facility])   PLANTAR FASCIA SURGERY Bilateral     FAMILY HISTORY: Family History  Problem Relation Age of Onset   Arthritis Mother    Hyperlipidemia Mother    Hypertension Mother    Tremor Mother    Atrial fibrillation Mother        pacemaker   Leukemia Mother    Headache Mother    Valvular heart disease Mother    Cerebral aneurysm Mother 68       with brain bleed   Asthma Father    Kidney cancer Father    Kidney disease Father 93   Migraines Father    Crohn's disease Sister    Alcohol abuse Maternal Grandmother    Arthritis Maternal Grandmother    Heart disease Maternal Grandfather    Alcohol abuse Maternal Grandfather    Arthritis Paternal Grandmother    Asthma Paternal Grandmother    Headache Paternal Grandmother    Asthma Daughter    Tremor Daughter    Migraines Nephew    Colon cancer Neg Hx    Esophageal cancer Neg Hx    Rectal cancer Neg Hx    Stomach cancer Neg Hx    Stroke Neg Hx     SOCIAL HISTORY: Social History   Socioeconomic History   Marital status: Married    Spouse name: Not on file   Number of children: 1   Years of education: Not on file   Highest education level: Not on file  Occupational History   Occupation: Designer, television/film set: [employer]  Tobacco Use   Smoking status: Every Day    Current packs/day: 0.00    Average packs/day: 0.5 packs/day for 41.0 years (20.5 ttl pk-yrs)    Types: Cigarettes    Start date: 08/02/1980    Last attempt to quit: 08/02/2021    Years since quitting: 1.7   Smokeless tobacco: Never   Tobacco comments:    less then 1/2 a pack a day.  prev able to tolerate chantix   Vaping Use   Vaping status: Never Used  Substance and Sexual Activity   Alcohol use: Yes    Alcohol/week: 7.0 standard drinks of alcohol    Types: 7 Glasses of wine per week    Comment: social  drinker   Drug use: No   Sexual activity: Not on file  Other Topics Concern   Not on file  Social History Narrative   Not on file   Social Drivers of Health   Financial Resource Strain: Not on file  Food Insecurity: Not on file  Transportation Needs: Not on file  Physical Activity: Not on file  Stress: Not on file  Social Connections: Not on file  Intimate Partner Violence: Not on file      PHYSICAL EXAM  Vitals:   05/10/23 1055  BP: 118/74  Pulse: 73  Weight: 137 lb 3.2 oz (62.2 kg)  Height: 5\' 5"  (1.651 m)    Body mass index is 22.83 kg/m.  Generalized: Well developed, in no acute distress   Neurological examination  Mentation: Alert oriented to time, place, history taking. Follows all commands speech and language fluent Cranial nerve II-XII: Pupils were equal round reactive to light. Extraocular movements were full, visual field were full on  confrontational test.  Head turning and shoulder shrug  were normal and symmetric.  Tremor noted in the jaw and the head Motor: The motor testing reveals 5 over 5 strength of all 4 extremities. Good symmetric motor tone is noted throughout. Sensory: Sensory testing is intact to soft touch on all 4 extremities. No evidence of extinction is noted.  Coordination: Cerebellar testing reveals good finger-nose-finger and heel-to-shin bilaterally.  Intention tremor noted in the upper extremities left greater than right. Gait and station: Gait is normal.    DIAGNOSTIC DATA (LABS, IMAGING, TESTING) - I reviewed patient records, labs, notes, testing and imaging myself where available.  Lab Results  Component Value Date   WBC 5.0 01/22/2023   HGB 13.1 01/22/2023   HCT 39.4 01/22/2023   MCV 99 (H) 01/22/2023   PLT 239 01/22/2023      Component Value Date/Time   NA 142 01/22/2023 0820   K 4.4 01/22/2023 0820   CL 108 (H) 01/22/2023 0820   CO2 20 01/22/2023 0820   GLUCOSE 115 (H) 01/22/2023 0820   GLUCOSE 114 (H) 06/28/2019 1710    BUN 17 01/22/2023 0820   CREATININE 0.80 01/22/2023 0820   CALCIUM  9.5 01/22/2023 0820   PROT 6.1 01/22/2023 0820   ALBUMIN 4.2 01/22/2023 0820   AST 49 (H) 01/22/2023 0820   ALT 100 (H) 01/22/2023 0820   ALKPHOS 93 01/22/2023 0820   BILITOT <0.2 01/22/2023 0820   GFRNONAA >60 06/28/2019 1710   GFRAA >60 06/28/2019 1710   Lab Results  Component Value Date   CHOL 225 (H) 01/22/2023   HDL 43 01/22/2023   LDLCALC 79 01/22/2023   LDLDIRECT 91.0 04/25/2019   TRIG 649 (HH) 01/22/2023   CHOLHDL 5.2 (H) 01/22/2023      ASSESSMENT AND PLAN 58 y.o. year old female  has a past medical history of Allergy, Anxiety, Arthritis, Asthma, GERD (gastroesophageal reflux disease), Hyperlipidemia, Mini stroke, Morton's neuroma (10/2020), Smoking, and Stroke (HCC). here with:  1.  Essential tremor 2.  Migraine headaches  Advised her to try  primidone  to 50 mg in the morning, 25 mg at noon  and 100 mg at bedtime Continue propranolol  ER 160 mg daily Advised that in the future we may can consider referral to academic center if tremor continues to worsen  despite medication adjustments Continue Topamax  50 mg in the morning and 100 mg at bedtime I cautioned the patient about over-the-counter medication and rebound headaches. Discussed with the patient about adding on new medication-Qulipta for her migraines.  I have reviewed the medication with the patient and provided her information on her after visit summary.  We could also consider increasing Topamax  to 100 mg twice a day She will follow-up in 6 months or sooner if needed.    Clem Currier, MSN, NP-C 05/10/2023, 11:07 AM [facility] [address] [place], [place] [address] [phone]

## 2023-05-10 NOTE — Patient Instructions (Addendum)
 Your Plan:  Increase primidone  to 50 mg in the morning, 25 mg at noon and 100 mg at bedtime. Continue propranolol  ER 120 mg daily   In regards to your migraines we could consider increasing Topamax  to 100 mg twice a day or if you would rather try a new medication we could consider adding on Qulipta.  I have provided information about Qulipta.  Please read over this and let me know how you would like to proceed.  Consider limiting your use of over-the-counter medication as this can cause rebound headaches.   Thank you for coming to see us  at [facility]. I hope we have been able to provide you high quality care today.  You may receive a patient satisfaction survey over the next few weeks. We would appreciate your feedback and comments so that we may continue to improve ourselves and the health of our patients.

## 2023-05-10 NOTE — Progress Notes (Signed)
 I agree with the A/P as outlined by Clem Currier, NP.

## 2023-05-21 ENCOUNTER — Inpatient Hospital Stay: Admission: RE | Admit: 2023-05-21 | Source: Ambulatory Visit

## 2023-05-31 ENCOUNTER — Other Ambulatory Visit: Payer: Managed Care, Other (non HMO)

## 2023-06-04 ENCOUNTER — Encounter

## 2023-06-15 ENCOUNTER — Encounter: Payer: Self-pay | Admitting: Family Medicine

## 2023-06-15 DIAGNOSIS — G43009 Migraine without aura, not intractable, without status migrainosus: Secondary | ICD-10-CM

## 2023-06-15 NOTE — Telephone Encounter (Signed)
 Name of Medication:  Fioricet Name of Pharmacy:  [facility] Last Fill or Written Date and Quantity:  03/23/23, #30 Last Office Visit and Type:  01/28/22, CPE Next Office Visit and Type:  02/01/24, CPE Last Controlled Substance Agreement Date:  none Last UDS:  none

## 2023-06-19 MED ORDER — BUTALBITAL-APAP-CAFFEINE 50-325-40 MG PO TABS
1.0000 | ORAL_TABLET | Freq: Two times a day (BID) | ORAL | 0 refills | Status: DC | PRN
Start: 2023-06-19 — End: 2023-08-30

## 2023-06-19 NOTE — Telephone Encounter (Signed)
 ERx

## 2023-07-04 ENCOUNTER — Other Ambulatory Visit: Payer: Self-pay | Admitting: Gastroenterology

## 2023-07-04 DIAGNOSIS — K219 Gastro-esophageal reflux disease without esophagitis: Secondary | ICD-10-CM

## 2023-07-20 ENCOUNTER — Ambulatory Visit: Admitting: Family Medicine

## 2023-07-20 VITALS — BP 122/82 | HR 60 | Temp 98.8°F | Ht 65.0 in | Wt 139.0 lb

## 2023-07-20 DIAGNOSIS — E559 Vitamin D deficiency, unspecified: Secondary | ICD-10-CM | POA: Diagnosis not present

## 2023-07-20 DIAGNOSIS — E785 Hyperlipidemia, unspecified: Secondary | ICD-10-CM | POA: Diagnosis not present

## 2023-07-20 DIAGNOSIS — G43009 Migraine without aura, not intractable, without status migrainosus: Secondary | ICD-10-CM

## 2023-07-20 DIAGNOSIS — F17200 Nicotine dependence, unspecified, uncomplicated: Secondary | ICD-10-CM | POA: Diagnosis not present

## 2023-07-20 DIAGNOSIS — R5383 Other fatigue: Secondary | ICD-10-CM

## 2023-07-20 DIAGNOSIS — N959 Unspecified menopausal and perimenopausal disorder: Secondary | ICD-10-CM | POA: Insufficient documentation

## 2023-07-20 DIAGNOSIS — K219 Gastro-esophageal reflux disease without esophagitis: Secondary | ICD-10-CM

## 2023-07-20 DIAGNOSIS — Z8673 Personal history of transient ischemic attack (TIA), and cerebral infarction without residual deficits: Secondary | ICD-10-CM

## 2023-07-20 MED ORDER — ESTRADIOL 0.1 MG/GM VA CREA: 1.0000 | TOPICAL_CREAM | VAGINAL | 5 refills | Status: DC

## 2023-07-20 MED ORDER — VARENICLINE TARTRATE 0.5 MG PO TABS: 0.5000 mg | ORAL_TABLET | Freq: Two times a day (BID) | ORAL | 0 refills | Status: AC

## 2023-07-20 NOTE — Assessment & Plan Note (Addendum)
 Continue aspirin, statin.

## 2023-07-20 NOTE — Assessment & Plan Note (Signed)
 Update levels on vit D3 2000 international units  daily.

## 2023-07-20 NOTE — Assessment & Plan Note (Signed)
 Continues estrace  cream with benefit. Notes Estroven supplement (containing black cohosh) has significantly helped hot flashes. Discussed caution with estrogenic properties in stroke hx.

## 2023-07-20 NOTE — Assessment & Plan Note (Signed)
 Appreciate neurology care Continues topamax , propranolol  preventatively Using ibuprofen  and fioricet sparingly

## 2023-07-20 NOTE — Assessment & Plan Note (Signed)
 She continues chantix  0.5mg  bid PRN and gets benefit from this.  Continue to encourage full cessation  She is due for lung cancer screening program - # provided

## 2023-07-20 NOTE — Patient Instructions (Addendum)
 Labs today  I have refilled chantix  and estrace  cream. Caution with estrogenic properties of Estroven Good to see you today Continue current medicines Return in 6 months for physical  May contact Lung cancer screening program phone number is [phone]

## 2023-07-20 NOTE — Assessment & Plan Note (Signed)
 Chronic, update labs with recent addition of fibrate to atorvastatin .  Fibrate was started with trig levels returned 600s Goal LDL <70 given stroke history.  The ASCVD Risk score (Arnett DK, et al., 2019) failed to calculate for the following reasons:   Risk score cannot be calculated because patient has a medical history suggesting prior/existing ASCVD

## 2023-07-20 NOTE — Assessment & Plan Note (Signed)
 Chronic. Notes breakthrough symptoms if she misses Nexium  dose.

## 2023-07-20 NOTE — Progress Notes (Signed)
 Ph: [phone] Fax: [phone]   Patient ID: Kristie Miles, female    DOB: 15-Apr-1965, 58 y.o.   MRN: [id number]  This visit was conducted in person.  BP 122/82   Pulse 60   Temp 98.8 F (37.1 C) (Oral)   Ht 5' 5 (1.651 m)   Wt 139 lb (63 kg)   SpO2 98%   BMI 23.13 kg/m    CC: 6 mo f/u visit  Subjective:   HPI: Kristie Miles is a 58 y.o. female presenting on 07/20/2023 for Medical Management of Chronic Issues (Here for f/u)   ET, migraines followed by [facility] on primidone  50/25/100mg  daily, propranolol  ER 160mg  daily, topamax  50/100mg  daily. Discussing Qulipta preventative medication. On ibuprofen  abortively. I also fill fioricet for abortive treatment.  H/o remote R cerebellar infarct.   Estrace  0.1mg /gm 1 applicatorful twice weekly. On this for post-menopausal symptoms. Also on Estroven supplement which has helped hot flashes.   Tobacco use - was using chantix  to cut down on smoking (1-2 cig/day).   Dyslipidemia - on atorvastatin , fenofibrate  145mg  started 01/2023 - due for rpt FLP.  The ASCVD Risk score (Arnett DK, et al., 2019) failed to calculate for the following reasons:   Risk score cannot be calculated because patient has a medical history suggesting prior/existing ASCVD     Relevant past medical, surgical, family and social history reviewed and updated as indicated. Interim medical history since our last visit reviewed. Allergies and medications reviewed and updated. Outpatient Medications Prior to Visit  Medication Sig Dispense Refill   aspirin  81 MG EC tablet Take 1 tablet (81 mg total) by mouth daily. Swallow whole.     atorvastatin  (LIPITOR) 40 MG tablet Take 1 tablet (40 mg total) by mouth daily. 90 tablet 4   butalbital -acetaminophen -caffeine  (FIORICET) 50-325-40 MG tablet Take 1 tablet by mouth 2 (two) times daily as needed for migraine (sedation precautions). 30 tablet 0   Calcium  200 MG TABS Take by mouth.     Cholecalciferol (VITAMIN  D3) 25 MCG (1000 UT) CAPS Take 2 capsules (2,000 Units total) by mouth daily.     esomeprazole  (NEXIUM ) 40 MG capsule TAKE ONE CAPSULE BY MOUTH DAILY AT 12 NOON 90 capsule 3   fenofibrate  (TRICOR ) 145 MG tablet Take 1 tablet (145 mg total) by mouth daily. 90 tablet 4   Flaxseed, Linseed, (FLAX SEEDS PO) Take by mouth.     ibuprofen  (ADVIL ) 800 MG tablet TAKE 1 TABLET BY MOUTH THREE TIMES A DAY 90 tablet 1   Multiple Vitamins-Minerals (CENTRUM PO) Take by mouth daily.     Nutritional Supplements (ESTROVEN PO) Take by mouth.     ondansetron  (ZOFRAN ) 4 MG tablet Take 1 tablet (4 mg total) by mouth every 8 (eight) hours as needed. 20 tablet 3   PARoxetine  (PAXIL ) 40 MG tablet TAKE 1 TABLET(40 MG) BY MOUTH EVERY MORNING 90 tablet 3   primidone  (MYSOLINE ) 50 MG tablet Take 1 tablet in the AM , 1/2 tablet at noon and 2 tablets in the PM 315 tablet 3   propranolol  ER (INDERAL  LA) 160 MG SR capsule TAKE 1 CAPSULE BY MOUTH DAILY 90 capsule 3   Rhubarb (ESTROVEN MENOPAUSE RELIEF) 4 MG TABS Take by mouth.     topiramate  (TOPAMAX ) 50 MG tablet TAKE 1 TABLET BY MOUTH DAILY AND 2 TABLETS BY MOUTH AT BEDTIME 270 tablet 3   triamcinolone  cream (KENALOG ) 0.1 % Apply 1 Application topically 2 (two) times daily as needed. 45 g 1  valACYclovir  (VALTREX ) 1000 MG tablet Take 2 tablets (2,000 mg total) by mouth 2 (two) times daily. As needed for fever blister 20 tablet 3   Cyanocobalamin (B-12 PO) Take by mouth daily.     estradiol  (ESTRACE ) 0.1 MG/GM vaginal cream Place 1 Applicatorful vaginally 2 (two) times a week. 42.5 g 3   varenicline  (CHANTIX ) 0.5 MG tablet Take 1 tablet (0.5 mg total) by mouth 2 (two) times daily. 180 tablet 0   predniSONE  (DELTASONE ) 20 MG tablet Take 2 tablets (40 mg total) by mouth daily. For 5 days, then 1 tab daily for 5 days 15 tablet 0   No facility-administered medications prior to visit.     Per HPI unless specifically indicated in ROS section below Review of Systems  Objective:   BP 122/82   Pulse 60   Temp 98.8 F (37.1 C) (Oral)   Ht 5' 5 (1.651 m)   Wt 139 lb (63 kg)   SpO2 98%   BMI 23.13 kg/m   Wt Readings from Last 3 Encounters:  07/20/23 139 lb (63 kg)  05/10/23 137 lb 3.2 oz (62.2 kg)  02/19/23 140 lb (63.5 kg)      Physical Exam Vitals and nursing note reviewed.  Constitutional:      Appearance: Normal appearance. She is not ill-appearing.  HENT:     Head: Normocephalic and atraumatic.     Mouth/Throat:     Mouth: Mucous membranes are moist.     Pharynx: Oropharynx is clear. No oropharyngeal exudate or posterior oropharyngeal erythema.  Eyes:     Extraocular Movements: Extraocular movements intact.     Conjunctiva/sclera: Conjunctivae normal.     Pupils: Pupils are equal, round, and reactive to light.  Cardiovascular:     Rate and Rhythm: Normal rate and regular rhythm.     Pulses: Normal pulses.     Heart sounds: Normal heart sounds. No murmur heard. Pulmonary:     Effort: Pulmonary effort is normal. No respiratory distress.     Breath sounds: Normal breath sounds. No wheezing, rhonchi or rales.  Musculoskeletal:     Cervical back: Normal range of motion and neck supple.     Right lower leg: No edema.     Left lower leg: No edema.  Skin:    General: Skin is warm and dry.     Findings: No rash.  Neurological:     Mental Status: She is alert.  Psychiatric:        Mood and Affect: Mood normal.        Behavior: Behavior normal.       Results for orders placed or performed in visit on 01/22/23  Lipid panel   Collection Time: 01/22/23  8:20 AM  Result Value Ref Range   Cholesterol, Total 225 (H) 100 - 199 mg/dL   Triglycerides 350 (HH) 0 - 149 mg/dL   HDL 43 >60 mg/dL   VLDL Cholesterol Cal 103 (H) 5 - 40 mg/dL   LDL Chol Calc (NIH) 79 0 - 99 mg/dL   Chol/HDL Ratio 5.2 (H) 0.0 - 4.4 ratio  Hemoglobin A1c   Collection Time: 01/22/23  8:20 AM  Result Value Ref Range   Hgb A1c MFr Bld 5.9 (H) 4.8 - 5.6 %   Est. average  glucose Bld gHb Est-mCnc 123 mg/dL  CBC with Differential/Platelet   Collection Time: 01/22/23  8:20 AM  Result Value Ref Range   WBC 5.0 3.4 - 10.8 x10E3/uL   RBC 3.97 3.77 -  5.28 x10E6/uL   Hemoglobin 13.1 11.1 - 15.9 g/dL   Hematocrit 60.5 65.9 - 46.6 %   MCV 99 (H) 79 - 97 fL   MCH 33.0 26.6 - 33.0 pg   MCHC 33.2 31.5 - 35.7 g/dL   RDW 87.2 88.2 - 84.5 %   Platelets 239 150 - 450 x10E3/uL   Neutrophils 46 Not Estab. %   Lymphs 42 Not Estab. %   Monocytes 7 Not Estab. %   Eos 5 Not Estab. %   Basos 0 Not Estab. %   Neutrophils Absolute 2.3 1.4 - 7.0 x10E3/uL   Lymphocytes Absolute 2.1 0.7 - 3.1 x10E3/uL   Monocytes Absolute 0.3 0.1 - 0.9 x10E3/uL   EOS (ABSOLUTE) 0.2 0.0 - 0.4 x10E3/uL   Basophils Absolute 0.0 0.0 - 0.2 x10E3/uL   Immature Granulocytes 0 Not Estab. %   Immature Grans (Abs) 0.0 0.0 - 0.1 x10E3/uL  TSH   Collection Time: 01/22/23  8:20 AM  Result Value Ref Range   TSH 1.660 0.450 - 4.500 uIU/mL  Comprehensive metabolic panel   Collection Time: 01/22/23  8:20 AM  Result Value Ref Range   Glucose 115 (H) 70 - 99 mg/dL   BUN 17 6 - 24 mg/dL   Creatinine, Ser 9.19 0.57 - 1.00 mg/dL   eGFR 85 >40 fO/fpw/8.26   BUN/Creatinine Ratio 21 9 - 23   Sodium 142 134 - 144 mmol/L   Potassium 4.4 3.5 - 5.2 mmol/L   Chloride 108 (H) 96 - 106 mmol/L   CO2 20 20 - 29 mmol/L   Calcium  9.5 8.7 - 10.2 mg/dL   Total Protein 6.1 6.0 - 8.5 g/dL   Albumin 4.2 3.8 - 4.9 g/dL   Globulin, Total 1.9 1.5 - 4.5 g/dL   Bilirubin Total <9.7 0.0 - 1.2 mg/dL   Alkaline Phosphatase 93 44 - 121 IU/L   AST 49 (H) 0 - 40 IU/L   ALT 100 (H) 0 - 32 IU/L  VITAMIN D  25 Hydroxy (Vit-D Deficiency, Fractures)   Collection Time: 01/22/23  8:20 AM  Result Value Ref Range   Vit D, 25-Hydroxy 25.3 (L) 30.0 - 100.0 ng/mL   No results found for: VITAMINB12  Assessment & Plan:   Problem List Items Addressed This Visit     History of CVA (cerebrovascular accident)   Continue aspirin ,  statin.       Relevant Orders   Vitamin B12   VITAMIN D  25 Hydroxy (Vit-D Deficiency, Fractures)   Lipid panel   Comprehensive metabolic panel with GFR   GERD (gastroesophageal reflux disease)   Chronic. Notes breakthrough symptoms if she misses Nexium  dose.       Smoker   She continues chantix  0.5mg  bid PRN and gets benefit from this.  Continue to encourage full cessation  She is due for lung cancer screening program - # provided      Relevant Medications   varenicline  (CHANTIX ) 0.5 MG tablet   Other Relevant Orders   Vitamin B12   VITAMIN D  25 Hydroxy (Vit-D Deficiency, Fractures)   Lipid panel   Comprehensive metabolic panel with GFR   Dyslipidemia - Primary   Chronic, update labs with recent addition of fibrate to atorvastatin .  Fibrate was started with trig levels returned 600s Goal LDL <70 given stroke history.  The ASCVD Risk score (Arnett DK, et al., 2019) failed to calculate for the following reasons:   Risk score cannot be calculated because patient has a medical history suggesting prior/existing  ASCVD       Relevant Orders   Vitamin B12   VITAMIN D  25 Hydroxy (Vit-D Deficiency, Fractures)   Lipid panel   Comprehensive metabolic panel with GFR   Migraine   Appreciate neurology care Continues topamax , propranolol  preventatively Using ibuprofen  and fioricet sparingly      Relevant Orders   Vitamin B12   VITAMIN D  25 Hydroxy (Vit-D Deficiency, Fractures)   Lipid panel   Comprehensive metabolic panel with GFR   Vitamin D  deficiency   Update levels on vit D3 2000 international units  daily.      Relevant Orders   Vitamin B12   VITAMIN D  25 Hydroxy (Vit-D Deficiency, Fractures)   Lipid panel   Comprehensive metabolic panel with GFR   Postmenopausal symptoms   Continues estrace  cream with benefit. Notes Estroven supplement (containing black cohosh) has significantly helped hot flashes. Discussed caution with estrogenic properties in stroke hx.        Other Visit Diagnoses       Fatigue, unspecified type       Relevant Orders   Vitamin B12   VITAMIN D  25 Hydroxy (Vit-D Deficiency, Fractures)   Lipid panel   Comprehensive metabolic panel with GFR        Meds ordered this encounter  Medications   estradiol  (ESTRACE ) 0.1 MG/GM vaginal cream    Sig: Place 1 Applicatorful vaginally 2 (two) times a week.    Dispense:  42.5 g    Refill:  5   varenicline  (CHANTIX ) 0.5 MG tablet    Sig: Take 1 tablet (0.5 mg total) by mouth 2 (two) times daily.    Dispense:  180 tablet    Refill:  0    **Patient requests 90 days supply**    Orders Placed This Encounter  Procedures   Vitamin B12   VITAMIN D  25 Hydroxy (Vit-D Deficiency, Fractures)   Lipid panel   Comprehensive metabolic panel with GFR    Patient Instructions  Labs today  I have refilled chantix  and estrace  cream. Caution with estrogenic properties of Estroven Good to see you today Continue current medicines Return in 6 months for physical  May contact Lung cancer screening program phone number is [phone]   Follow up plan: Return in about 6 months (around 01/20/2024) for annual exam, prior fasting for blood work.  Anton Blas, MD

## 2023-07-23 LAB — LIPID PANEL
Chol/HDL Ratio: 4.2 ratio (ref 0.0–4.4)
Cholesterol, Total: 186 mg/dL (ref 100–199)
HDL: 44 mg/dL (ref >39)
LDL Chol Calc (NIH): 90 mg/dL (ref 0–99)
Triglycerides: 317 mg/dL — ABNORMAL HIGH (ref 0–149)
VLDL Cholesterol Cal: 52 mg/dL — ABNORMAL HIGH (ref 5–40)

## 2023-07-23 LAB — COMPREHENSIVE METABOLIC PANEL WITH GFR
ALT: 16 IU/L (ref 0–32)
AST: 15 IU/L (ref 0–40)
Albumin: 4.4 g/dL (ref 3.8–4.9)
Alkaline Phosphatase: 79 IU/L (ref 44–121)
BUN/Creatinine Ratio: 24 — ABNORMAL HIGH (ref 9–23)
BUN: 20 mg/dL (ref 6–24)
Bilirubin Total: <0.2 mg/dL (ref 0.0–1.2)
CO2: 21 mmol/L (ref 20–29)
Calcium: 10 mg/dL (ref 8.7–10.2)
Chloride: 108 mmol/L — ABNORMAL HIGH (ref 96–106)
Creatinine, Ser: 0.85 mg/dL (ref 0.57–1.00)
Globulin, Total: 2 g/dL (ref 1.5–4.5)
Glucose: 109 mg/dL — ABNORMAL HIGH (ref 70–99)
Potassium: 4.4 mmol/L (ref 3.5–5.2)
Sodium: 142 mmol/L (ref 134–144)
Total Protein: 6.4 g/dL (ref 6.0–8.5)
eGFR: 79 mL/min/1.73 (ref >59)

## 2023-07-23 LAB — VITAMIN D 25 HYDROXY (VIT D DEFICIENCY, FRACTURES): Vit D, 25-Hydroxy: 22.8 ng/mL — AB (ref 30.0–100.0)

## 2023-07-23 LAB — VITAMIN B12: Vitamin B-12: 350 pg/mL (ref 232–1245)

## 2023-07-24 ENCOUNTER — Ambulatory Visit: Payer: Self-pay | Admitting: Family Medicine

## 2023-07-24 DIAGNOSIS — E785 Hyperlipidemia, unspecified: Secondary | ICD-10-CM

## 2023-08-06 MED ORDER — ATORVASTATIN CALCIUM 80 MG PO TABS: 80.0000 mg | ORAL_TABLET | Freq: Every day | ORAL | 3 refills | Status: AC

## 2023-08-06 MED ORDER — VITAMIN D (ERGOCALCIFEROL) 1.25 MG (50000 UNIT) PO CAPS: 50000.0000 [IU] | ORAL_CAPSULE | ORAL | 3 refills | Status: AC

## 2023-08-30 ENCOUNTER — Telehealth: Payer: Self-pay

## 2023-08-30 ENCOUNTER — Encounter: Payer: Self-pay | Admitting: Family Medicine

## 2023-08-30 DIAGNOSIS — G43009 Migraine without aura, not intractable, without status migrainosus: Secondary | ICD-10-CM

## 2023-08-30 MED ORDER — BUTALBITAL-APAP-CAFFEINE 50-325-40 MG PO TABS: 1.0000 | ORAL_TABLET | Freq: Two times a day (BID) | ORAL | 0 refills | Status: DC | PRN

## 2023-08-30 NOTE — Telephone Encounter (Signed)
 Checked [address]. No form has came through yet.

## 2023-08-30 NOTE — Telephone Encounter (Deleted)
 Copied from CRM [id number]. Topic: General - Other >> Aug 30, 2023 11:04 AM [name] wrote: Reason for CRM: pt called in to receive office fax number to fax a physical form she needs for her job. Her deadline to turn in form is tomorrow 8/29 but she is currently out of state taking care of her sick mother. Be on the lookout for this form and fill out to fax back to her employer.

## 2023-08-30 NOTE — Telephone Encounter (Signed)
 Name of Medication:  Fioricet Name of Pharmacy:  [facility] Last Fill or Written Date and Quantity:  06/19/23, #30 Last Office Visit and Type:  07/20/23, f/u Next Office Visit and Type:  02/01/24, CPE Last Controlled Substance Agreement Date:  none Last UDS:  none

## 2023-08-30 NOTE — Telephone Encounter (Signed)
 ERx

## 2023-08-30 NOTE — Telephone Encounter (Signed)
 Copied from CRM [id number]. Topic: General - Other >> Aug 30, 2023 11:04 AM [name] wrote: Reason for CRM: pt called in to receive office fax number to fax a physical form she needs for her job. Her deadline to turn in form is tomorrow 8/29 but she is currently out of state taking care of her sick mother. Be on the lookout for this form and fill out to fax back to her employer.

## 2023-08-31 NOTE — Telephone Encounter (Addendum)
 Signed and in Amys' box.  Will need Creatinine corrected

## 2023-08-31 NOTE — Telephone Encounter (Signed)
 Placed in your box for review sent message to patient to let know we need her measurements.

## 2023-08-31 NOTE — Telephone Encounter (Addendum)
 Correction made by Amy. Form faxed to [employer] at [phone].   Made copy to scan and mailed original to pt, per her request.

## 2023-09-04 ENCOUNTER — Other Ambulatory Visit: Payer: Self-pay | Admitting: Adult Health

## 2023-11-19 ENCOUNTER — Encounter: Payer: Self-pay | Admitting: Family Medicine

## 2023-11-19 DIAGNOSIS — G43009 Migraine without aura, not intractable, without status migrainosus: Secondary | ICD-10-CM

## 2023-11-19 MED ORDER — BUTALBITAL-APAP-CAFFEINE 50-325-40 MG PO TABS: 1.0000 | ORAL_TABLET | Freq: Two times a day (BID) | ORAL | 0 refills | Status: AC | PRN

## 2023-11-19 NOTE — Telephone Encounter (Signed)
 ERx Higher # due to losing insurance

## 2023-12-03 ENCOUNTER — Other Ambulatory Visit: Payer: Self-pay | Admitting: Adult Health

## 2023-12-11 ENCOUNTER — Encounter: Payer: Self-pay | Admitting: Family Medicine

## 2023-12-11 ENCOUNTER — Ambulatory Visit: Admitting: Family Medicine

## 2023-12-12 ENCOUNTER — Ambulatory Visit: Admitting: Family Medicine

## 2023-12-12 MED ORDER — ALBUTEROL SULFATE HFA 108 (90 BASE) MCG/ACT IN AERS: 2.0000 | INHALATION_SPRAY | Freq: Four times a day (QID) | RESPIRATORY_TRACT | 1 refills | Status: AC | PRN

## 2023-12-12 MED ORDER — GUAIFENESIN-CODEINE 100-10 MG/5ML PO SOLN: 5.0000 mL | Freq: Three times a day (TID) | ORAL | 0 refills | Status: DC | PRN

## 2023-12-19 ENCOUNTER — Ambulatory Visit: Admitting: Family Medicine

## 2023-12-19 ENCOUNTER — Encounter: Payer: Self-pay | Admitting: Family Medicine

## 2023-12-19 VITALS — BP 120/78 | HR 66 | Temp 98.2°F | Ht 65.0 in | Wt 138.2 lb

## 2023-12-19 DIAGNOSIS — R051 Acute cough: Secondary | ICD-10-CM | POA: Diagnosis not present

## 2023-12-19 DIAGNOSIS — F17200 Nicotine dependence, unspecified, uncomplicated: Secondary | ICD-10-CM | POA: Diagnosis not present

## 2023-12-19 DIAGNOSIS — B009 Herpesviral infection, unspecified: Secondary | ICD-10-CM

## 2023-12-19 DIAGNOSIS — J111 Influenza due to unidentified influenza virus with other respiratory manifestations: Secondary | ICD-10-CM | POA: Diagnosis not present

## 2023-12-19 MED ORDER — VALACYCLOVIR HCL 1 G PO TABS: 2000.0000 mg | ORAL_TABLET | Freq: Two times a day (BID) | ORAL | 3 refills | Status: AC

## 2023-12-19 NOTE — Assessment & Plan Note (Addendum)
 Continue to encourage full smoking cessation.  She is using chantix , with intermittent interspersed smoking.  She is due for lung cancer screening - order already in chart. Rec call [place] [facility] to schedule this at her convenience.

## 2023-12-19 NOTE — Assessment & Plan Note (Addendum)
 Recently diagnosed influenza at [facility] treated with steroid injection, tamiflu, levaquin 500mg  7d course. I also prescribed albuterol  inh, cheratussin with benefit.  Exam today overall reassuring Suspect post-infectious cough - reviewed this.  Refill cheratussin x1 Rx WASP for prednisone  with indications when to fill.  She agrees with plan.

## 2023-12-19 NOTE — Assessment & Plan Note (Addendum)
 Valtrex  refilled PRN herpes labialis

## 2023-12-19 NOTE — Progress Notes (Signed)
 Ph: [phone] Fax: [phone]   Patient ID: Kristie Miles, female    DOB: Jan 04, 1965, 58 y.o.   MRN: [id number]  This visit was conducted in person.  BP 120/78 (Cuff Size: Normal)   Pulse 66   Temp 98.2 F (36.8 C) (Oral)   Ht 5' 5 (1.651 m)   Wt 138 lb 3.2 oz (62.7 kg)   SpO2 96%   BMI 23.00 kg/m    CC: f/u acute illness Subjective:   HPI: Kristie Miles is a 58 y.o. female presenting on 12/19/2023 for Medical Management of Chronic Issues (Pt was sick starting 11/29 with cough, fever, diarrhea and flu, treated at urgent care on 12/2 with antibiotic and tamaflu for treatment of Flu, pt states she no longer has fever but still feels awful, she still has dry throat, dry/ painful cough, diarrhea, and has not been able to eat//Would like RX Refills///FYI still smoking with chantex )   Lost job at [employer] - losing insurance at end of the year.  This past year has been very busy - caring for mother who passed away. Planning to visit her father in [place] this Christmas  Seen at [facility] 12/10/2023 - diagnosed with influenza treated with steroid injection with tamiflu and levofloxacin 500mg  7d course. I also refilled albuterol  inhaler and codeine  cough syrup on 12/12/2023 - she requests codeine  cough refill.   Initial symptoms were cough, fever, diarrhea, body aches, headache - sudden onset.  Currently persistent symptoms include dry throat, painful productive cough of brown mucous, diarrhea, anorexia. + wheezing - albuterol  inhaler is helping.  Cough is affecting sleep.  No ear or tooth pain, ST, body aches.   Some day smoker - using chantix  as well which causes mild nausea. Encouraged full cessation.      Relevant past medical, surgical, family and social history reviewed and updated as indicated. Interim medical history since our last visit reviewed. Allergies and medications reviewed and updated. Outpatient Medications Prior to Visit  Medication Sig Dispense Refill    albuterol  (VENTOLIN  HFA) 108 (90 Base) MCG/ACT inhaler Inhale 2 puffs into the lungs every 6 (six) hours as needed for wheezing or shortness of breath. 8 each 1   aspirin  81 MG EC tablet Take 1 tablet (81 mg total) by mouth daily. Swallow whole.     atorvastatin  (LIPITOR) 80 MG tablet Take 1 tablet (80 mg total) by mouth daily. 90 tablet 3   butalbital -acetaminophen -caffeine  (FIORICET) 50-325-40 MG tablet Take 1 tablet by mouth 2 (two) times daily as needed for migraine (sedation precautions). 90 tablet 0   Calcium  200 MG TABS Take by mouth.     esomeprazole  (NEXIUM ) 40 MG capsule TAKE ONE CAPSULE BY MOUTH DAILY AT 12 NOON 90 capsule 3   estradiol  (ESTRACE ) 0.1 MG/GM vaginal cream Place 1 Applicatorful vaginally 2 (two) times a week. 42.5 g 5   fenofibrate  (TRICOR ) 145 MG tablet Take 1 tablet (145 mg total) by mouth daily. 90 tablet 4   Multiple Vitamins-Minerals (CENTRUM PO) Take by mouth daily.     Nutritional Supplements (ESTROVEN PO) Take by mouth.     ondansetron  (ZOFRAN ) 4 MG tablet Take 1 tablet (4 mg total) by mouth every 8 (eight) hours as needed. 20 tablet 3   PARoxetine  (PAXIL ) 40 MG tablet TAKE 1 TABLET(40 MG) BY MOUTH EVERY MORNING 90 tablet 3   primidone  (MYSOLINE ) 50 MG tablet TAKE 1 TABLET BY MOUTH IN THE  MORNING, 1/2 TABLET AT NOON, AND 2 TABLETS IN  THE EVENING 105 tablet 0   propranolol  ER (INDERAL  LA) 160 MG SR capsule TAKE 1 CAPSULE BY MOUTH DAILY 90 capsule 3   Rhubarb (ESTROVEN MENOPAUSE RELIEF) 4 MG TABS Take by mouth.     topiramate  (TOPAMAX ) 50 MG tablet TAKE 1 TABLET BY MOUTH DAILY AND 2 TABLETS BY MOUTH AT BEDTIME 270 tablet 3   triamcinolone  cream (KENALOG ) 0.1 % Apply 1 Application topically 2 (two) times daily as needed. 45 g 1   varenicline  (CHANTIX ) 0.5 MG tablet Take 1 tablet (0.5 mg total) by mouth 2 (two) times daily. 180 tablet 0   Vitamin D , Ergocalciferol , (DRISDOL ) 1.25 MG (50000 UNIT) CAPS capsule Take 1 capsule (50,000 Units total) by mouth every 7  (seven) days. 12 capsule 3   guaiFENesin -codeine  100-10 MG/5ML syrup Take 5 mLs by mouth 3 (three) times daily as needed. 120 mL 0   ibuprofen  (ADVIL ) 800 MG tablet TAKE 1 TABLET BY MOUTH THREE TIMES A DAY 90 tablet 1   valACYclovir  (VALTREX ) 1000 MG tablet Take 2 tablets (2,000 mg total) by mouth 2 (two) times daily. As needed for fever blister 20 tablet 3   Flaxseed, Linseed, (FLAX SEEDS PO) Take by mouth.     levofloxacin (LEVAQUIN) 500 MG tablet Take 500 mg by mouth daily. (Patient not taking: Reported on 12/19/2023)     oseltamivir (TAMIFLU) 75 MG capsule Take 75 mg by mouth 2 (two) times daily. (Patient not taking: Reported on 12/19/2023)     No facility-administered medications prior to visit.     Per HPI unless specifically indicated in ROS section below Review of Systems  Objective:  BP 120/78 (Cuff Size: Normal)   Pulse 66   Temp 98.2 F (36.8 C) (Oral)   Ht 5' 5 (1.651 m)   Wt 138 lb 3.2 oz (62.7 kg)   SpO2 96%   BMI 23.00 kg/m   Wt Readings from Last 3 Encounters:  12/19/23 138 lb 3.2 oz (62.7 kg)  07/20/23 139 lb (63 kg)  05/10/23 137 lb 3.2 oz (62.2 kg)      Physical Exam Vitals and nursing note reviewed.  Constitutional:      Appearance: Normal appearance. She is not ill-appearing.  HENT:     Head: Normocephalic and atraumatic.     Right Ear: Tympanic membrane, ear canal and external ear normal.     Left Ear: Tympanic membrane, ear canal and external ear normal.     Ears:     Comments: Cerumen covering both TMs    Nose: Nose normal. No congestion or rhinorrhea.     Mouth/Throat:     Mouth: Mucous membranes are moist.     Pharynx: Oropharynx is clear. No oropharyngeal exudate or posterior oropharyngeal erythema.  Eyes:     Extraocular Movements: Extraocular movements intact.     Conjunctiva/sclera: Conjunctivae normal.     Pupils: Pupils are equal, round, and reactive to light.  Cardiovascular:     Rate and Rhythm: Normal rate and regular rhythm.      Pulses: Normal pulses.     Heart sounds: Normal heart sounds. No murmur heard. Pulmonary:     Effort: Pulmonary effort is normal. No respiratory distress.     Breath sounds: Normal breath sounds. No wheezing, rhonchi or rales.     Comments: Lungs clear  Lymphadenopathy:     Head:     Right side of head: No submental, submandibular, tonsillar, preauricular or posterior auricular adenopathy.     Left side of  head: No submental, submandibular, tonsillar, preauricular or posterior auricular adenopathy.     Cervical: No cervical adenopathy.     Right cervical: No superficial cervical adenopathy.    Left cervical: No superficial cervical adenopathy.     Upper Body:     Right upper body: No supraclavicular adenopathy.     Left upper body: No supraclavicular adenopathy.  Skin:    Findings: No rash.  Neurological:     Mental Status: She is alert.  Psychiatric:        Mood and Affect: Mood normal.        Behavior: Behavior normal.       Lab Results  Component Value Date   HGBA1C 5.9 (H) 01/22/2023    Assessment & Plan:   Problem List Items Addressed This Visit     Smoker   Continue to encourage full smoking cessation.  She is using chantix , with intermittent interspersed smoking.  She is due for lung cancer screening - order already in chart. Rec call [place] [facility] to schedule this at her convenience.       Herpes simplex   Valtrex  refilled PRN herpes labialis      Relevant Medications   valACYclovir  (VALTREX ) 1000 MG tablet   Influenza - Primary   Recently diagnosed influenza at [facility] treated with steroid injection, tamiflu, levaquin 500mg  7d course. I also prescribed albuterol  inh, cheratussin with benefit.  Exam today overall reassuring Suspect post-infectious cough - reviewed this.  Refill cheratussin x1 Rx WASP for prednisone  with indications when to fill.  She agrees with plan.       Relevant Medications   valACYclovir  (VALTREX ) 1000 MG tablet   Other Visit  Diagnoses       Acute cough            Meds ordered this encounter  Medications   valACYclovir  (VALTREX ) 1000 MG tablet    Sig: Take 2 tablets (2,000 mg total) by mouth 2 (two) times daily. As needed for fever blister    Dispense:  20 tablet    Refill:  3   guaiFENesin -codeine  100-10 MG/5ML syrup    Sig: Take 5 mLs by mouth 3 (three) times daily as needed.    Dispense:  120 mL    Refill:  0   DISCONTD: predniSONE  (DELTASONE ) 20 MG tablet    Sig: Take two tablets daily for 3 days followed by one tablet daily for 4 days    Dispense:  10 tablet    Refill:  0   predniSONE  (DELTASONE ) 20 MG tablet    Sig: Take two tablets daily for 3 days followed by one tablet daily for 4 days    Dispense:  10 tablet    Refill:  0    No orders of the defined types were placed in this encounter.   Patient Instructions  Lungs sound good today Codeine  cough syrup refilled Valtrex  refilled Likely persistent post-infectious cough due to lung inflammation  Should continue to improve over next 1-2 weeks. If not getter better, may fill prednisone  taper printed out today.   Follow up plan: Return if symptoms worsen or fail to improve.  Anton Blas, MD

## 2023-12-19 NOTE — Patient Instructions (Signed)
 Lungs sound good today Codeine  cough syrup refilled Valtrex  refilled Likely persistent post-infectious cough due to lung inflammation  Should continue to improve over next 1-2 weeks. If not getter better, may fill prednisone  taper printed out today.

## 2023-12-24 ENCOUNTER — Ambulatory Visit: Admitting: Adult Health

## 2024-01-11 ENCOUNTER — Other Ambulatory Visit: Payer: Self-pay | Admitting: Adult Health

## 2024-01-14 ENCOUNTER — Other Ambulatory Visit: Payer: Self-pay | Admitting: Family Medicine

## 2024-01-14 ENCOUNTER — Other Ambulatory Visit

## 2024-01-21 ENCOUNTER — Other Ambulatory Visit: Payer: Self-pay | Admitting: Family Medicine

## 2024-01-21 DIAGNOSIS — R7303 Prediabetes: Secondary | ICD-10-CM

## 2024-01-21 DIAGNOSIS — E785 Hyperlipidemia, unspecified: Secondary | ICD-10-CM

## 2024-01-21 DIAGNOSIS — E559 Vitamin D deficiency, unspecified: Secondary | ICD-10-CM

## 2024-01-22 ENCOUNTER — Other Ambulatory Visit: Payer: Self-pay | Admitting: Family Medicine

## 2024-01-22 DIAGNOSIS — G43009 Migraine without aura, not intractable, without status migrainosus: Secondary | ICD-10-CM

## 2024-01-25 ENCOUNTER — Other Ambulatory Visit: Payer: Managed Care, Other (non HMO)

## 2024-02-01 ENCOUNTER — Encounter: Payer: Managed Care, Other (non HMO) | Admitting: Family Medicine

## 2024-03-10 ENCOUNTER — Encounter: Payer: Self-pay | Admitting: Family Medicine

## 2024-03-11 ENCOUNTER — Ambulatory Visit: Admitting: Neurology
